# Patient Record
Sex: Female | Born: 1950
Health system: Southern US, Community
[De-identification: ages and names within clinical notes are randomized; demographics above are authoritative.]

## PROBLEM LIST (undated history)

## (undated) DIAGNOSIS — R7303 Prediabetes: Secondary | ICD-10-CM

## (undated) DIAGNOSIS — I499 Cardiac arrhythmia, unspecified: Secondary | ICD-10-CM

## (undated) DIAGNOSIS — G43909 Migraine, unspecified, not intractable, without status migrainosus: Secondary | ICD-10-CM

## (undated) DIAGNOSIS — M545 Low back pain, unspecified: Secondary | ICD-10-CM

## (undated) DIAGNOSIS — J349 Unspecified disorder of nose and nasal sinuses: Secondary | ICD-10-CM

## (undated) DIAGNOSIS — M542 Cervicalgia: Secondary | ICD-10-CM

## (undated) DIAGNOSIS — M797 Fibromyalgia: Secondary | ICD-10-CM

## (undated) DIAGNOSIS — IMO0001 Reserved for inherently not codable concepts without codable children: Secondary | ICD-10-CM

## (undated) DIAGNOSIS — K219 Gastro-esophageal reflux disease without esophagitis: Secondary | ICD-10-CM

## (undated) DIAGNOSIS — M1990 Unspecified osteoarthritis, unspecified site: Secondary | ICD-10-CM

## (undated) DIAGNOSIS — I639 Cerebral infarction, unspecified: Secondary | ICD-10-CM

## (undated) DIAGNOSIS — I10 Essential (primary) hypertension: Secondary | ICD-10-CM

## (undated) DIAGNOSIS — G8929 Other chronic pain: Secondary | ICD-10-CM

## (undated) DIAGNOSIS — F54 Psychological and behavioral factors associated with disorders or diseases classified elsewhere: Secondary | ICD-10-CM

## (undated) HISTORY — PX: CHOLECYSTECTOMY: SHX55

## (undated) HISTORY — DX: Fibromyalgia: M79.7

## (undated) HISTORY — PX: TONSILLECTOMY: SUR1361

## (undated) HISTORY — DX: Unspecified osteoarthritis, unspecified site: M19.90

## (undated) HISTORY — PX: ABDOMINAL HYSTERECTOMY: SHX81

## (undated) HISTORY — PX: BREAST CYST EXCISION: SHX579

## (undated) HISTORY — DX: Gastro-esophageal reflux disease without esophagitis: K21.9

## (undated) HISTORY — DX: Reserved for inherently not codable concepts without codable children: IMO0001

## (undated) HISTORY — DX: Other chronic pain: G89.29

## (undated) HISTORY — DX: Cervicalgia: M54.2

---
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
## 1998-02-13 ENCOUNTER — Inpatient Hospital Stay (HOSPITAL_COMMUNITY): Admission: EM | Admit: 1998-02-13 | Discharge: 1998-02-14 | Payer: Self-pay | Admitting: Emergency Medicine

## 1998-02-19 ENCOUNTER — Encounter: Admission: RE | Admit: 1998-02-19 | Discharge: 1998-02-19 | Payer: Self-pay | Admitting: Internal Medicine

## 1998-03-19 ENCOUNTER — Encounter: Admission: RE | Admit: 1998-03-19 | Discharge: 1998-03-19 | Payer: Self-pay | Admitting: Hematology and Oncology

## 1998-10-01 ENCOUNTER — Inpatient Hospital Stay (HOSPITAL_COMMUNITY): Admission: EM | Admit: 1998-10-01 | Discharge: 1998-10-03 | Payer: Self-pay | Admitting: Cardiology

## 1999-07-29 ENCOUNTER — Ambulatory Visit (HOSPITAL_COMMUNITY): Admission: RE | Admit: 1999-07-29 | Discharge: 1999-07-29 | Payer: Self-pay | Admitting: Gastroenterology

## 1999-12-02 ENCOUNTER — Ambulatory Visit (HOSPITAL_COMMUNITY): Admission: RE | Admit: 1999-12-02 | Discharge: 1999-12-02 | Payer: Self-pay | Admitting: Gastroenterology

## 2001-01-27 ENCOUNTER — Encounter: Admission: RE | Admit: 2001-01-27 | Discharge: 2001-02-02 | Payer: Self-pay | Admitting: Podiatry

## 2001-01-27 ENCOUNTER — Other Ambulatory Visit: Admission: RE | Admit: 2001-01-27 | Discharge: 2001-01-27 | Payer: Self-pay | Admitting: *Deleted

## 2001-01-27 ENCOUNTER — Encounter: Payer: Self-pay | Admitting: *Deleted

## 2001-01-27 ENCOUNTER — Ambulatory Visit (HOSPITAL_COMMUNITY): Admission: RE | Admit: 2001-01-27 | Discharge: 2001-01-27 | Payer: Self-pay | Admitting: *Deleted

## 2001-03-31 ENCOUNTER — Other Ambulatory Visit: Admission: RE | Admit: 2001-03-31 | Discharge: 2001-03-31 | Payer: Self-pay | Admitting: *Deleted

## 2001-05-23 ENCOUNTER — Emergency Department (HOSPITAL_COMMUNITY): Admission: EM | Admit: 2001-05-23 | Discharge: 2001-05-23 | Payer: Self-pay | Admitting: Emergency Medicine

## 2001-05-23 ENCOUNTER — Encounter: Payer: Self-pay | Admitting: Emergency Medicine

## 2001-09-27 ENCOUNTER — Encounter: Payer: Self-pay | Admitting: Emergency Medicine

## 2001-09-27 ENCOUNTER — Inpatient Hospital Stay (HOSPITAL_COMMUNITY): Admission: EM | Admit: 2001-09-27 | Discharge: 2001-09-29 | Payer: Self-pay | Admitting: Emergency Medicine

## 2001-09-29 ENCOUNTER — Encounter: Payer: Self-pay | Admitting: Internal Medicine

## 2001-10-17 ENCOUNTER — Encounter: Payer: Self-pay | Admitting: Family Medicine

## 2001-10-17 ENCOUNTER — Ambulatory Visit (HOSPITAL_COMMUNITY): Admission: RE | Admit: 2001-10-17 | Discharge: 2001-10-17 | Payer: Self-pay | Admitting: Family Medicine

## 2001-10-26 ENCOUNTER — Other Ambulatory Visit: Admission: RE | Admit: 2001-10-26 | Discharge: 2001-10-26 | Payer: Self-pay | Admitting: Obstetrics and Gynecology

## 2001-11-16 ENCOUNTER — Encounter: Payer: Self-pay | Admitting: Obstetrics and Gynecology

## 2001-11-16 ENCOUNTER — Ambulatory Visit (HOSPITAL_COMMUNITY): Admission: RE | Admit: 2001-11-16 | Discharge: 2001-11-16 | Payer: Self-pay | Admitting: Obstetrics and Gynecology

## 2002-01-14 ENCOUNTER — Encounter: Payer: Self-pay | Admitting: Cardiology

## 2002-01-14 ENCOUNTER — Encounter: Payer: Self-pay | Admitting: Emergency Medicine

## 2002-01-14 ENCOUNTER — Inpatient Hospital Stay (HOSPITAL_COMMUNITY): Admission: EM | Admit: 2002-01-14 | Discharge: 2002-01-16 | Payer: Self-pay | Admitting: Cardiology

## 2002-02-02 ENCOUNTER — Ambulatory Visit (HOSPITAL_COMMUNITY): Admission: RE | Admit: 2002-02-02 | Discharge: 2002-02-02 | Payer: Self-pay | Admitting: General Surgery

## 2003-04-28 ENCOUNTER — Encounter: Payer: Self-pay | Admitting: Emergency Medicine

## 2003-04-28 ENCOUNTER — Emergency Department (HOSPITAL_COMMUNITY): Admission: EM | Admit: 2003-04-28 | Discharge: 2003-04-28 | Payer: Self-pay | Admitting: Emergency Medicine

## 2003-10-22 ENCOUNTER — Ambulatory Visit (HOSPITAL_COMMUNITY): Admission: RE | Admit: 2003-10-22 | Discharge: 2003-10-22 | Payer: Self-pay | Admitting: Obstetrics and Gynecology

## 2004-07-08 ENCOUNTER — Encounter: Admission: RE | Admit: 2004-07-08 | Discharge: 2004-09-02 | Payer: Self-pay | Admitting: Orthopedic Surgery

## 2004-10-08 ENCOUNTER — Ambulatory Visit (HOSPITAL_COMMUNITY): Admission: RE | Admit: 2004-10-08 | Discharge: 2004-10-08 | Payer: Self-pay | Admitting: Family Medicine

## 2004-12-04 ENCOUNTER — Ambulatory Visit (HOSPITAL_COMMUNITY): Admission: RE | Admit: 2004-12-04 | Discharge: 2004-12-04 | Payer: Self-pay | Admitting: Family Medicine

## 2004-12-18 ENCOUNTER — Ambulatory Visit (HOSPITAL_COMMUNITY): Admission: RE | Admit: 2004-12-18 | Discharge: 2004-12-18 | Payer: Self-pay | Admitting: Family Medicine

## 2005-05-28 ENCOUNTER — Ambulatory Visit (HOSPITAL_COMMUNITY): Admission: RE | Admit: 2005-05-28 | Discharge: 2005-05-28 | Payer: Self-pay | Admitting: Family Medicine

## 2005-10-15 ENCOUNTER — Encounter: Payer: Self-pay | Admitting: Obstetrics and Gynecology

## 2005-10-15 ENCOUNTER — Observation Stay (HOSPITAL_COMMUNITY): Admission: RE | Admit: 2005-10-15 | Discharge: 2005-10-16 | Payer: Self-pay | Admitting: Obstetrics and Gynecology

## 2007-11-21 ENCOUNTER — Ambulatory Visit (HOSPITAL_COMMUNITY): Admission: RE | Admit: 2007-11-21 | Discharge: 2007-11-21 | Payer: Self-pay | Admitting: Family Medicine

## 2008-02-15 ENCOUNTER — Emergency Department (HOSPITAL_COMMUNITY): Admission: EM | Admit: 2008-02-15 | Discharge: 2008-02-15 | Payer: Self-pay | Admitting: Emergency Medicine

## 2008-03-16 ENCOUNTER — Ambulatory Visit (HOSPITAL_COMMUNITY): Admission: RE | Admit: 2008-03-16 | Discharge: 2008-03-16 | Payer: Self-pay | Admitting: Family Medicine

## 2009-01-18 ENCOUNTER — Ambulatory Visit (HOSPITAL_COMMUNITY): Admission: RE | Admit: 2009-01-18 | Discharge: 2009-01-18 | Payer: Self-pay | Admitting: Family Medicine

## 2009-06-01 ENCOUNTER — Inpatient Hospital Stay (HOSPITAL_COMMUNITY): Admission: EM | Admit: 2009-06-01 | Discharge: 2009-06-05 | Payer: Self-pay | Admitting: Emergency Medicine

## 2009-06-02 ENCOUNTER — Ambulatory Visit: Payer: Self-pay | Admitting: Gastroenterology

## 2009-06-03 ENCOUNTER — Ambulatory Visit: Payer: Self-pay | Admitting: Gastroenterology

## 2009-06-04 ENCOUNTER — Ambulatory Visit: Payer: Self-pay | Admitting: Gastroenterology

## 2009-06-07 ENCOUNTER — Encounter: Payer: Self-pay | Admitting: Gastroenterology

## 2009-09-20 ENCOUNTER — Ambulatory Visit (HOSPITAL_COMMUNITY): Admission: RE | Admit: 2009-09-20 | Discharge: 2009-09-20 | Payer: Self-pay | Admitting: Family Medicine

## 2010-05-28 ENCOUNTER — Observation Stay (HOSPITAL_COMMUNITY): Admission: EM | Admit: 2010-05-28 | Discharge: 2010-05-29 | Payer: Self-pay | Admitting: Emergency Medicine

## 2010-07-15 ENCOUNTER — Emergency Department (HOSPITAL_COMMUNITY): Admission: EM | Admit: 2010-07-15 | Discharge: 2010-07-15 | Payer: Self-pay | Admitting: Emergency Medicine

## 2010-07-31 ENCOUNTER — Emergency Department (HOSPITAL_COMMUNITY)
Admission: EM | Admit: 2010-07-31 | Discharge: 2010-07-31 | Payer: Self-pay | Source: Home / Self Care | Admitting: Emergency Medicine

## 2010-10-21 ENCOUNTER — Ambulatory Visit (HOSPITAL_COMMUNITY)
Admission: RE | Admit: 2010-10-21 | Discharge: 2010-10-21 | Payer: Self-pay | Source: Home / Self Care | Attending: Family Medicine | Admitting: Family Medicine

## 2010-10-27 ENCOUNTER — Encounter: Payer: Self-pay | Admitting: Family Medicine

## 2010-11-06 ENCOUNTER — Encounter: Payer: Self-pay | Admitting: *Deleted

## 2010-12-18 LAB — CBC
HCT: 32.8 % — ABNORMAL LOW (ref 36.0–46.0)
Hemoglobin: 11.4 g/dL — ABNORMAL LOW (ref 12.0–15.0)
MCH: 31.1 pg (ref 26.0–34.0)
MCHC: 34.6 g/dL (ref 30.0–36.0)
MCV: 90.1 fL (ref 78.0–100.0)
Platelets: 184 10*3/uL (ref 150–400)
Platelets: 206 10*3/uL (ref 150–400)
RBC: 3.58 MIL/uL — ABNORMAL LOW (ref 3.87–5.11)
RBC: 3.64 MIL/uL — ABNORMAL LOW (ref 3.87–5.11)
RDW: 12.3 % (ref 11.5–15.5)
WBC: 6.8 10*3/uL (ref 4.0–10.5)
WBC: 9.3 10*3/uL (ref 4.0–10.5)

## 2010-12-18 LAB — COMPREHENSIVE METABOLIC PANEL
ALT: 10 U/L (ref 0–35)
AST: 16 U/L (ref 0–37)
Albumin: 3.4 g/dL — ABNORMAL LOW (ref 3.5–5.2)
Alkaline Phosphatase: 54 U/L (ref 39–117)
Chloride: 109 mEq/L (ref 96–112)
Creatinine, Ser: 0.69 mg/dL (ref 0.4–1.2)
GFR calc Af Amer: >60 mL/min (ref >60)
Potassium: 3.6 mEq/L (ref 3.5–5.1)
Sodium: 139 mEq/L (ref 135–145)
Total Bilirubin: 0.4 mg/dL (ref 0.3–1.2)

## 2010-12-18 LAB — D-DIMER, QUANTITATIVE: D-Dimer, Quant: 0.24 ug/mL-FEU (ref 0.00–0.48)

## 2010-12-18 LAB — BASIC METABOLIC PANEL
Calcium: 8.9 mg/dL (ref 8.4–10.5)
Creatinine, Ser: 0.74 mg/dL (ref 0.4–1.2)
GFR calc Af Amer: >60 mL/min (ref >60)
GFR calc non Af Amer: >60 mL/min (ref >60)

## 2010-12-18 LAB — DIFFERENTIAL
Basophils Absolute: 0 10*3/uL (ref 0.0–0.1)
Basophils Absolute: 0 10*3/uL (ref 0.0–0.1)
Basophils Relative: 0 % (ref 0–1)
Eosinophils Absolute: 0.1 10*3/uL (ref 0.0–0.7)
Eosinophils Absolute: 0.1 10*3/uL (ref 0.0–0.7)
Eosinophils Relative: 1 % (ref 0–5)
Lymphocytes Relative: 31 % (ref 12–46)
Monocytes Absolute: 0.4 10*3/uL (ref 0.1–1.0)
Monocytes Absolute: 0.5 10*3/uL (ref 0.1–1.0)
Monocytes Relative: 6 % (ref 3–12)

## 2010-12-18 LAB — CARDIAC PANEL(CRET KIN+CKTOT+MB+TROPI)
CK, MB: 1.1 ng/mL (ref 0.3–4.0)
CK, MB: 1.1 ng/mL (ref 0.3–4.0)
Relative Index: INVALID (ref 0.0–2.5)
Total CK: 63 U/L (ref 7–177)
Troponin I: <0.01 ng/mL (ref 0.00–0.06)

## 2010-12-18 LAB — LIPID PANEL
Triglycerides: 146 mg/dL (ref <150)
VLDL: 29 mg/dL (ref 0–40)

## 2011-01-10 LAB — CBC
HCT: 26.9 % — ABNORMAL LOW (ref 36.0–46.0)
HCT: 27 % — ABNORMAL LOW (ref 36.0–46.0)
Hemoglobin: 10.1 g/dL — ABNORMAL LOW (ref 12.0–15.0)
Hemoglobin: 11.8 g/dL — ABNORMAL LOW (ref 12.0–15.0)
Hemoglobin: 9.6 g/dL — ABNORMAL LOW (ref 12.0–15.0)
MCHC: 34.9 g/dL (ref 30.0–36.0)
MCHC: 35 g/dL (ref 30.0–36.0)
MCHC: 35 g/dL (ref 30.0–36.0)
MCHC: 35.6 g/dL (ref 30.0–36.0)
MCV: 89.9 fL (ref 78.0–100.0)
MCV: 90.2 fL (ref 78.0–100.0)
MCV: 92.6 fL (ref 78.0–100.0)
Platelets: 138 10*3/uL — ABNORMAL LOW (ref 150–400)
Platelets: 167 10*3/uL (ref 150–400)
Platelets: 195 10*3/uL (ref 150–400)
RBC: 3.02 MIL/uL — ABNORMAL LOW (ref 3.87–5.11)
RBC: 3.19 MIL/uL — ABNORMAL LOW (ref 3.87–5.11)
RBC: 3.59 MIL/uL — ABNORMAL LOW (ref 3.87–5.11)
RDW: 12.4 % (ref 11.5–15.5)
RDW: 13.9 % (ref 11.5–15.5)
WBC: 6.1 10*3/uL (ref 4.0–10.5)

## 2011-01-10 LAB — DIFFERENTIAL
Basophils Absolute: 0 10*3/uL (ref 0.0–0.1)
Basophils Absolute: 0 10*3/uL (ref 0.0–0.1)
Basophils Relative: 0 % (ref 0–1)
Basophils Relative: 0 % (ref 0–1)
Eosinophils Absolute: 0.2 10*3/uL (ref 0.0–0.7)
Eosinophils Absolute: 0.2 10*3/uL (ref 0.0–0.7)
Eosinophils Relative: 2 % (ref 0–5)
Eosinophils Relative: 4 % (ref 0–5)
Lymphocytes Relative: 30 % (ref 12–46)
Lymphocytes Relative: 37 % (ref 12–46)
Lymphocytes Relative: 43 % (ref 12–46)
Lymphs Abs: 2.4 10*3/uL (ref 0.7–4.0)
Lymphs Abs: 2.7 10*3/uL (ref 0.7–4.0)
Lymphs Abs: 3.5 10*3/uL (ref 0.7–4.0)
Monocytes Absolute: 0.6 10*3/uL (ref 0.1–1.0)
Monocytes Relative: 6 % (ref 3–12)
Monocytes Relative: 8 % (ref 3–12)
Neutro Abs: 3.4 10*3/uL (ref 1.7–7.7)
Neutro Abs: 4.6 10*3/uL (ref 1.7–7.7)
Neutrophils Relative %: 51 % (ref 43–77)
Neutrophils Relative %: 55 % (ref 43–77)
Neutrophils Relative %: 63 % (ref 43–77)

## 2011-01-10 LAB — URINALYSIS, ROUTINE W REFLEX MICROSCOPIC
Leukocytes, UA: NEGATIVE
Protein, ur: NEGATIVE mg/dL
Urobilinogen, UA: 0.2 mg/dL (ref 0.0–1.0)

## 2011-01-10 LAB — COMPREHENSIVE METABOLIC PANEL
CO2: 28 mEq/L (ref 19–32)
Calcium: 9.4 mg/dL (ref 8.4–10.5)
Creatinine, Ser: 0.69 mg/dL (ref 0.4–1.2)
GFR calc Af Amer: >60 mL/min (ref >60)
GFR calc non Af Amer: >60 mL/min (ref >60)
Glucose, Bld: 107 mg/dL — ABNORMAL HIGH (ref 70–99)

## 2011-01-10 LAB — HEMOGLOBIN AND HEMATOCRIT, BLOOD
HCT: 21.3 % — ABNORMAL LOW (ref 36.0–46.0)
HCT: 24.6 % — ABNORMAL LOW (ref 36.0–46.0)
HCT: 29.8 % — ABNORMAL LOW (ref 36.0–46.0)
Hemoglobin: 10.4 g/dL — ABNORMAL LOW (ref 12.0–15.0)
Hemoglobin: 7.8 g/dL — CL (ref 12.0–15.0)
Hemoglobin: 8.7 g/dL — ABNORMAL LOW (ref 12.0–15.0)

## 2011-01-10 LAB — CROSSMATCH

## 2011-01-10 LAB — BASIC METABOLIC PANEL
BUN: 10 mg/dL (ref 6–23)
BUN: 4 mg/dL — ABNORMAL LOW (ref 6–23)
BUN: 5 mg/dL — ABNORMAL LOW (ref 6–23)
CO2: 27 mEq/L (ref 19–32)
CO2: 27 mEq/L (ref 19–32)
CO2: 30 mEq/L (ref 19–32)
Calcium: 7.9 mg/dL — ABNORMAL LOW (ref 8.4–10.5)
Calcium: 8.2 mg/dL — ABNORMAL LOW (ref 8.4–10.5)
Chloride: 108 mEq/L (ref 96–112)
Chloride: 109 mEq/L (ref 96–112)
Creatinine, Ser: 0.46 mg/dL (ref 0.4–1.2)
Creatinine, Ser: 0.66 mg/dL (ref 0.4–1.2)
GFR calc Af Amer: >60 mL/min (ref >60)
GFR calc non Af Amer: >60 mL/min (ref >60)
GFR calc non Af Amer: >60 mL/min (ref >60)
Glucose, Bld: 101 mg/dL — ABNORMAL HIGH (ref 70–99)
Glucose, Bld: 111 mg/dL — ABNORMAL HIGH (ref 70–99)
Glucose, Bld: 119 mg/dL — ABNORMAL HIGH (ref 70–99)
Potassium: 3.6 mEq/L (ref 3.5–5.1)
Potassium: 3.8 mEq/L (ref 3.5–5.1)
Sodium: 137 mEq/L (ref 135–145)
Sodium: 141 mEq/L (ref 135–145)

## 2011-01-10 LAB — URINE MICROSCOPIC-ADD ON

## 2011-01-10 LAB — URINE CULTURE
Colony Count: NO GROWTH
Culture: NO GROWTH

## 2011-01-10 LAB — PROTIME-INR
INR: 1.1 (ref 0.00–1.49)
Prothrombin Time: 12 seconds (ref 11.6–15.2)

## 2011-01-10 LAB — LIPASE, BLOOD: Lipase: 20 U/L (ref 11–59)

## 2011-01-10 LAB — MAGNESIUM: Magnesium: 1.8 mg/dL (ref 1.5–2.5)

## 2011-01-10 LAB — IRON AND TIBC: Saturation Ratios: 20 % (ref 20–55)

## 2011-02-17 NOTE — Op Note (Signed)
NAME:  Sarah [name], WITTERS NO.:  [id number]   MEDICAL RECORD NO.:  [id number]          PATIENT TYPE:  INP   LOCATION:  A203                          FACILITY:  [facility]   PHYSICIAN:  Kassie Mends, M.D.      DATE OF BIRTH:  12-16-1950   DATE OF PROCEDURE:  06/03/2009  DATE OF DISCHARGE:                               OPERATIVE REPORT   PRIMARY CARE PHYSICIAN:  Corrie Mckusick, M.D.   PROCEDURE:  Ileocolonoscopy.   INDICATION FOR EXAM:  Ms. Khurana is a 60 year old female who presented  with mild right lower quadrant discomfort and rectal bleeding.   FINDINGS:  1. Pancolonic diverticulosis most pronounced in the sigmoid colon.  No      active bleeding noted.  No large clots seen.  Otherwise no masses,      inflammatory changes or polyps.  Polyps less than 5 mm would have      been missed in the right colon.  There was a significant amount of      liquid stool left in the right colon.  2. Normal terminal ileum, approximately 15 cm visualized.  No old      blood or fresh blood seen in the terminal ileum.  3. Normal retroflexed view of the rectum.   DIAGNOSIS:  Hematochezia secondary to diverticular bleed.   RECOMMENDATIONS:  1. Advance diet.  2. Protonix b.i.d.  3. Hemoglobin and hematocrit every 12 hours.  Next one at 1600.      Transfuse as needed.  4. Consider a surgery consult if her number of blood transfusions      reaches greater than 10 units of packed red blood cells.  She      currently has received 2 units since admission.  5. Colonoscopy in 5 years.   MEDICATIONS:  1. Demerol 50 mg IV.  2. Versed 3 mg IV.   PROCEDURE TECHNIQUE:  Physical exam was performed.  Informed consent was  obtained from the patient after explaining benefits, risks and  alternatives to the procedure.  The patient was connected to the monitor  placed and placed in the left lateral position.  Continuous oxygen was  provided by nasal cannula and IV medicine administered through an  indwelling cannula.  After administration of sedation and rectal exam,  the patient's rectum was intubated  and the scope was advanced under direct visualization to the distal  terminal ileum.  The scope was removed slowly by carefully examining the  color, texture, anatomy and integrity of the mucosa on the way out.  The  patient was recovered in endoscopy and discharged to the floor in  satisfactory condition.      Kassie Mends, M.D.  Electronically Signed     SM/MEDQ  D:  06/03/2009  T:  06/03/2009  Job:  [id number]   cc:   Corrie Mckusick, M.D.  Fax: [phone]

## 2011-02-17 NOTE — H&P (Signed)
NAME:  Sarah [name], SALLIE NO.:  [id number]   MEDICAL RECORD NO.:  [id number]          PATIENT TYPE:  INP   LOCATION:  A203                          FACILITY:  [facility]   PHYSICIAN:  Dorris Singh, DO    DATE OF BIRTH:  1951/02/09   DATE OF ADMISSION:  06/01/2009  DATE OF DISCHARGE:  LH                              HISTORY & PHYSICAL   The patient is a 60 year old Caucasian female who presented to the  emergency room complaining of lower GI bleed.  The patient states that  over the last couple days she has felt pretty nauseated and has had some  right lower abdominal pain that has progressed to bilateral lower  abdominal pain.  She states that she has had some weakness and some  dizziness and some fever and chills.  She denies eating anything unusual  over the last couple of weeks.  She said that today she went to bathroom  and had a frank blood in the toilet.  She came to the emergency room and  also had several episodes of frank blood measuring up to 100 mL in a  nun's hat.  She also mentioned she has had diarrhea.  Currently she does  not have any other diarrhea.  Nothing is making it better and the  symptoms have been associated with abdominal pain.   PAST MEDICAL HISTORY:  She has had hysterectomy and appendectomy.  She  has acid reflux, multiple sclerosis, as well as postmenopausal.   SOCIAL HISTORY:  She is married.  She is a nonsmoker, nondrinker, and  denies any drug use.   ALLERGIES:  She has to PENICILLIN.   The patient also states that she does take Premarin and Nexium.  She has  not been able to afford the medication so therefore she has not been on  them.   REVIEW OF SYSTEMS:  Constitutionally positive for fever, decreased  appetite, chills, dizziness, fatigue.  Gastrointestinal:  Positive for  diarrhea, abdominal pain, blood in stool and nausea.  Neuro:  Positive  for weakness and dizziness.  All systems negative except as marked or  noted in the  HPI.   CURRENT PHYSICAL EXAM:  VITAL SIGNS:  Her vital signs have been stable  with blood pressure 119/92, pulse rate 79, respirations 18, temperature  97.9, and she is pulse oxing on room air at 100%.  GENERAL:  The patient is a well-developed, well-nourished Caucasian  female who is currently in no acute distress.  HEAD AND FACE:  Atraumatic, normocephalic.  Her eyes are PERRL.  EOMI.  No conjunctival injection or scleral icterus.  Her ears __________  within normal limits.  Teeth are in fair repair.  NECK:  Supple.  Full range of motion.  No thyromegaly, carotid bruit, or  lymphadenopathy.  HEART:  Regular rate and rhythm.  No murmur, rub or gallops noted.  LUNGS:  Her respirations are clear to auscultation bilaterally.  No  rales, wheezes, or rhonchi.  ABDOMEN:  Soft with tenderness in the left and right lower quadrant,  nondistended.  No masses, no hepatosplenomegaly noted.  Bowel  sounds are  hyperactive.  GU:  External genitalia is within normal limits.  There is no CVA  tenderness.  EXTREMITIES:  Positive pulses.  No edema, ecchymosis or cyanosis.  Full  range of motion.  NEURO:  A plus O x3.  Motor is intact in all extremities.  Sensation is  normal and cranial nerves II-XII are grossly intact.  SKIN:  Cool, dry.  No petechia or rash noted.  PSYCHIATRIC:  The patient to the A plus O x4, mildly anxious.   CURRENT LABS:  Her lipase was 20 and her B-Met:  Sodium was 140,  potassium 3.3, chloride 109, carbon dioxide 20, glucose 107, BUN 12,  creatinine 0.69.  Her liver enzymes were within normal limits.  Her INR  is 0.9.  She does have a few bacteria in her urine.  However, she is  nitrate negative and leukocyte esterase negative.  Her white count is  9.5.  Her hemoglobin of 11.8, hematocrit 33.2.   ASSESSMENT:  Lower GI bleed.   PLAN:  Will admit the patient to the service of [facility].  Will make her n.p.o.  Will start her on serial H and H's,  iron  studies, cross and type her for 2 units of blood.  Also give her  antiemetics.  Consult GI to see her.  Will also order a CT of the  abdomen and pelvis due to this being an infective process.  She is also  has __________ samples taken.  We will continue to monitor her and  monitor strict I's and O's for tonight.  The patient currently is stable  but will monitor her vitals and blood work to determine if she needs to  be seen more emergently by a specialist.  Will also put her on DVT and  GI prophylaxis.  Will continue to monitor her and change therapy as  necessary.      Dorris Singh, DO  Electronically Signed     CB/MEDQ  D:  06/01/2009  T:  06/01/2009  Job:  [id number]

## 2011-02-17 NOTE — Discharge Summary (Signed)
NAME:  Sarah [name], Sarah [name] NO.:  [id number]   MEDICAL RECORD NO.:  [id number]          PATIENT TYPE:  INP   LOCATION:  A203                          FACILITY:  [facility]   PHYSICIAN:  Melissa L. Ladona Ridgel, MD  DATE OF BIRTH:  [birth year]   DATE OF ADMISSION:  06/01/2009  DATE OF DISCHARGE:  09/01/2010LH                               DISCHARGE SUMMARY   DISCHARGE DIAGNOSES:  1. Diverticular bleed.  2. Chronic back pain.  3. History of multiple sclerosis.  4. Gastroesophageal reflux disease.   CONSULTS:  She was seen by gastroenterology on August 29 for GI bleeding  by Dr. Cira Servant.   PROCEDURES:  On August 30th, the patient had a colonoscopy.  This was  also done by Dr. Cira Servant and results of the colonoscopy are as follows.  1. Pan colonic diverticulosis most pronounced in the sigmoid.  2. Normal terminal ileum, no fresh or old blood seen.  Dr. Cira Servant does      mention that there was a significant amount of liquid stool left in      the right colon and the polyps, less than 5 mm, would have been      missed in the right colon.   PROCEDURE #2:  On August 30, the patient had a blood transfusion.  She  received 2 units of A positive without complications.   HISTORY AND BRIEF HOSPITAL COURSE:  Sarah [name] is a 60 year old  Caucasian female who lost her insurance and consequently has not been  following up with her doctors as she normally would.  She presented to  the [facility] Emergency Department complaining of lower abdominal pain,  nausea, diarrhea and weakness as well as fever x2 days.  She had frank  blood per rectum on June 01, 2009.  She was admitted to [facility], given empiric Rocephin, started on IV fluids and a  gastroenterology consult was called.  A CT scan of her abdomen, pelvis  was done and it was found to be negative for appendicitis or any  particular source of pain.  It was positive for sigmoid diverticulosis.  Symptomatically, the patient continued  having dark liquid bowel  movements.  Colonoscopy was done.  Results were noted above.  No fresh  blood was seen.  On the day after the colonoscopy on August 31, the  patient did have 1 dark watery stool.  Her diet was advanced.  Unfortunately the patient continued to complain of some mild right lower  abdominal and back pain; however, she was able to tolerate a regular  diet, ambulate and she is having bowel movements now without blood as  she describes them to me this morning.   PHYSICAL EXAM:  VITAL SIGNS:  Today her vital signs are as follows.  Temperature 97.1, pulse 59, respirations 18, blood pressure is 103/66.  GENERAL:  The patient is a thin, 60 year old Caucasian female in no  apparent distress.  HEENT:  Her head is atraumatic normocephalic.  Her eyes are anicteric  with pupils that are equal, round, reactive to light.  Her conjunctivae  are pink.  Nose shows no nasal discharge or exterior lesions.  Her mouth  has moist mucous membranes with no exterior lesions.  NECK:  Her neck has no lymphadenopathy.  No JVD.  Trachea is midline.  Neck is supple.  CHEST:  Chest has no accessory muscle usage.  She has no wheezes or  crackles to my auscultation.  HEART:  Her heart has regular rate and rhythm without murmurs, rubs or  gallops.  ABDOMEN: Soft, tender to palpation just over the bladder as well as in  her right lower quadrant, not tender elsewhere.  She has a well-healed  scar from her cesarean section, good bowel sounds.  EXTREMITIES:  Extremities show no edema, no cyanosis.  She has 2+  bilateral pulses in both her radial pulse and her dorsalis pedis pulses.  PSYCHIATRIC:  The patient describes that she is going through a  separation and has increased anxiety; however, she has no suicidal  ideations, homicidal ideations or evidence of chronic depression.   SIGNIFICANT LABS:  The patient was admitted with a hemoglobin of 11.8.  This trended down to 7.8 on August 30.  She  received 2 units of packed  red blood cells and on the evening of August 31, her hemoglobin was  10.4.  This morning, September 1, her hemoglobin is 12.1, but I am  suspicious of the accuracy of that lab value.  Her B-met has remained  within normal limits other than glucose that ranged from 101-119.  Urinalysis on admission was negative for nitrates, negative from  leukocytes.  She did have 3-6 white blood cells per high-powered field  so a urine culture was done and it did not grow anything.   This morning when I talked to the patient about discharge, she described  her right lower quadrant pain and said that 2 years ago, she was  diagnosed by Dr. Emelda Fear with a pocket in the area of her right lower  quadrant.  He did a scan in his office and it was decided that they  should just follow it. I re-reviewed her CT scan from August 29 with Dr.  Amil Amen, the radiologist and no source of her pain was found.  There  were no abscesses, fluid pockets and her appendix did not look inflamed.  She did not have ovaries.  There was no obvious source of her pain.  The  patient does have an appointment with [facility] on  September 14.  Will ensure that she has follow-up for this right lower  quadrant pain at that time in the outpatient setting.   DISPOSITION:  The patient is being discharged to home in stable  condition.   FOLLOW UP:  She will have follow-up with Dr. Kassie Mends at [facility] in 2-3 weeks.  She will also be seen at the [facility]  of Health on September 14.  Her primary care physician was Dr. Phillips Odor:  We will ensure that he receives a copy of this transcription.   DISCHARGE MEDICATIONS:  1. Stool softener daily of the patient's choice  2. Ultram 50 mg 1-2 tablets q.4-6 h as needed for pain.  Do not take      more than 8 tablets in 24 hours.  We are giving her a prescription      for 60 tablets, which should be more than enough to last her until       she gets to [facility] on the [facility]th.   Approximately 45 minutes  was spent on this discharge summary.   Addendum: I saw and examined this patient prior to her discharge.  I  agree with the above exam.  The patient will  return to ER if she  develops further bleeding  otherwise she has been given instructions to  followup with the he Health Department as above.   Melissa L. Ladona Ridgel, M.D.      Stephani Police, PA      Melissa L. Ladona Ridgel, MD  Electronically Signed    MLY/MEDQ  D:  06/05/2009  T:  06/05/2009  Job:  [id number]   cc:   Health Department in [place] Rudd, M.D.  [address]  [place] , [place] [address]   Corrie Mckusick, M.D.  Fax: [phone]

## 2011-02-17 NOTE — Consult Note (Signed)
NAME:  Sarah [name], Sarah [name] NO.:  [id number]   MEDICAL RECORD NO.:  [id number]          PATIENT TYPE:  INP   LOCATION:  A203                          FACILITY:  [facility]   PHYSICIAN:  Kassie Mends, M.D.      DATE OF BIRTH:  [birth year]   DATE OF CONSULTATION:  06/02/2009  DATE OF DISCHARGE:                                 CONSULTATION   REASON CONSULTATION:  Rectal bleeding.   HISTORY OF PRESENT ILLNESS:  Sarah [name] is a 60 year old female who has  a known history of sigmoid colon diverticulosis.  She has never had a  colonoscopy.  She presented with 5 episodes of bright red blood per  rectum, approximately 1-2 cups at a the time, started last night.  She  has some mild right lower quadrant pain associated with it.  No fever,  chills, vomiting, diarrhea or problems swallowing.  She had some hot  flashes and some mild nausea associated with her symptoms.  This has  never happened before.  She uses Zantac for heartburn because she cannot  afford Nexium.  She denies any aspirin, BC, Goody powder use.  She uses  Aleve twice a day for headaches and back pain.  She does not smoke and  does not use any alcohol.  She has not consumed any eggs.  She did eat  out at [facility] on Friday in [place] and she had chicken.   PAST MEDICAL HISTORY:  1. Gastroesophageal reflux disease.  2. Possible multiple sclerosis manifested by numbness and weakness in      her legs.   PAST SURGICAL HISTORY:  1. Hysterectomy.  2. Appendectomy.   ALLERGIES:  PENICILLIN.   MEDICATIONS:  1. Cipro.  2. Lasix.  3. Protonix.  4. Dilaudid.  5. Zofran.   FAMILY HISTORY:  She denies any family history of colon cancer, colon  polyps or inflammatory bowel disease.   SOCIAL HISTORY:  She is married.  She has 1 child by her first marriage.  He is 29.   REVIEW OF SYSTEMS:  Per the HPI, otherwise all systems are negative.   PHYSICAL EXAMINATION:  VITAL SIGNS:  T-max 97.3,  blood pressure 119/57,  respiratory rate 18, heart rate 66.  GENERAL:  She is in no apparent distress, alert but sleepy.  She did  fall asleep in the middle of obtaining her history.  HEENT:  Atraumatic, normocephalic.  Pupils equal, react to light.  Mouth  no oral lesions.  Posterior pharynx without erythema or exudate.  NECK:  Full range of motion.  No lymphadenopathy.  LUNGS:  Clear to auscultation bilaterally.  CARDIOVASCULAR:  Shows regular rhythm, no murmur, normal S1, S2.  ABDOMEN:  Bowel sounds are present, soft, nondistended, mild tenderness  to palpation in the right lower quadrant without rebound or guarding.  EXTREMITIES:  No cyanosis or edema.  NEURO:  She has no focal neurologic deficits.   LABS:  White count 7.6, hemoglobin 11.8-9.1, platelets 195.  INR 1.1.  Creatinine 0.66, normal hepatic function, lipase 20.  UA 3-6 WBCs and  few bacteria.   ASSESSMENT/PLAN:  Ms.  Sarah [name] is a 60 year old female who has a lower  gastrointestinal bleed.  Most likely reason is a diverticular bleed.  Thank you for allowing me to see Sarah [name] in consultation.  My  recommendations follow.   RECOMMENDATIONS:  1. Clear liquid diet and n.p.o. after midnight.  2. MiraLax prep.  3. Colonoscopy on 08/30.  4. Check hemoglobin, hematocrit every 8 hours.  5. B.i.d. proton pump inhibitor.      Kassie Mends, M.D.  Electronically Signed     SM/MEDQ  D:  06/02/2009  T:  06/02/2009  Job:  [id number]   cc:   Corrie Mckusick, M.D.  Fax: [phone]

## 2011-02-20 NOTE — H&P (Signed)
[place]  Patient:    Sarah [name], Sarah [name] Visit Number: [id number] MRN: [id number]          Service Type: MED Location: 2000 2040 01 Attending Physician:  Jonelle Sidle Dictated by:   Jonelle Sidle, M.D. [facility] Admit Date:  01/14/2002 Discharge Date: 01/16/2002                           History and Physical  DATE OF BIRTH:  1951/09/19  PRIMARY CARE PHYSICIAN:  Jonell Cluck, M.D.  CHIEF COMPLAINT:  Shortness of breath and chest discomfort.  HISTORY OF PRESENT ILLNESS:  Ms. Dickison is a 60 year old woman with past medical history outlined below, who presents in transfer from [facility] Emergency Department.  Her presenting history begins on Thursday evening when she developed a sensation of "something stuck in my throat," associated with general malaise.  The following day she began to develop some ill-defined, "achy" chest discomfort and vague left arm numbness.  This persisted for several hours throughout the day without any particular aggravating or alleviating factors.  Later that evening, the patient developed further malaise and a dry cough and relative insomnia with subjective fevers and chills.  The following morning the patient was not feeling any different and presented to her primary care physician, Dr. Nobie Putnam.  On her way back to the examining room the patient apparently "blacked out" for a few seconds and fell to the floor.  She was subsequently transferred to the emergency department at [facility] for further evaluation.  The patient had a chest x-ray at that facility that showed no acute cardiopulmonary disease.  Her 12-lead electrocardiogram showed nonspecific T-wave changes and a relative decrease in her anterior R-wave progression compared to previous tracings in 1999.  She was subsequently transferred for further evaluation.  At present the patient complains predominantly of a pleuritic chest discomfort and  infrequent dry cough.  She describes lower extremity cramping, but has had no calf swelling or tenderness.  In general she was in her usual state of health until Thursday evening.  ALLERGIES:  PENICILLIN.  MEDICATIONS: 1. Prevacid 20 mg p.o. q.d. 2. Premarin 0.125 mg p.o. q.d.  PAST MEDICAL HISTORY: 1. Osteoarthritis. 2. Nephrolithiasis, diagnosed in December of 2002. 3. History of sinusitis. 4. Status post hysterectomy in 1986. 5. Status post cholecystectomy in the 1990s. 6. Status post herniorrhaphy in the 1990s. 7. No known history of type 2 diabetes mellitus, hypertension, coronary    artery disease, congestive heart failure, dyslipidemia, or arrhythmias.  SOCIAL HISTORY:  The patient lives in [place] with her husband of six years. 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  She works third shift.  She denies tobacco and alcohol use as well as illicit drug use.  FAMILY HISTORY:  The patients mother died at age 55 with a myocardial infarction.  History also significant for type 2 diabetes mellitus and hypertension.  REVIEW OF SYSTEMS:  As described in History of Present Illness.  The patient also complains of headaches and occasional arthralgias.  She has intermittent palpitations as well and these were part of her presenting symptoms on Friday.  PHYSICAL EXAMINATION:  VITAL SIGNS:  Temperature 96.7 degrees, heart rate 56 and regular, respirations 16, blood pressure 120/70, oxygen saturation 99% on 2 L nasal cannula.  GENERAL:  This is an obese woman lying supine in bed in no acute distress. She  seems uncomfortable.  HEENT:  Conjunctivae and lids are normal.  Oropharynx is clear.  NECK:  Supple without elevated jugular venous pressure or carotid bruits.  No thyromegaly is noted.  LUNGS:  Clear to auscultation bilaterally.  CARDIOVASCULAR:  Reveals a bradycardic regular rhythm without murmur or S3 gallop.  No pericardial rub is  noted.  ABDOMEN:  Soft and nontender without rebound or guarding.  No obvious hepatomegaly or bruits are noted.  EXTREMITIES:  The extremities exhibit no clubbing, cyanosis, or edema.  SKIN:  No ulcerative changes or rashes are noted.  MUSCULOSKELETAL:  No kyphosis noted.  NEUROPSYCHIATRIC:  The patient is alert and oriented x3.  Affect is normal.  LABORATORY DATA:  From [facility]:  WBC 5.9, hemoglobin 12.2, hematocrit 33.6, platelet count 249.  Blood gas shows a pH of 7.49, PCO2 27, PO2 71, bicarb 20, oxygen saturation 95% on room air.  D-dimer is less than 0.50.  Total CK 61, CK-MB 1.3, troponin I 0.04.  No chemistries were obtained.  Chest x-ray shows no acute cardiopulmonary disease pattern.  A 12 lead electrocardiogram shows normal sinus rhythm at 65 beats per minute with nonspecific ST/T-wave changes in the lateral leads and relatively decreased R-wave progression in the precordial leads compared to previous tracing from 1999.  There is also a leftward axis nearly diagnostic of a left anterior fascicular block.  IMPRESSION/RECOMMENDATIONS: 1. Constellation of symptoms including malaise, palpitations initially,    atypical, somewhat pleuritic chest discomfort, dyspnea, and a nonproductive    cough in a 60 year old woman with no known history of coronary artery    disease.  A 12-lead electrocardiogram is abnormal but nonspecific.  Initial    cardiac enzymes are normal.  D-dimer is borderline normal.  Suspect that    patients symptoms are noncardiac based on presentation, but would continue    serial cardiac enzymes and repeat a 12 lead electrocardiogram in the    morning.  Although somewhat atypical for a pulmonary embolus, the apparent    episode of syncope raises this possibility, particularly given dyspnea and    pleuritic chest discomfort, although the patient is not either tachycardic    or hypoxic.  Would favor a CT scan of the chest to exclude this     possibility.  We may be simply dealing with a viral upper respiratory    infection.  Will reevaluate when more information is available.     Chemistries are pending. 2. Apparent history of gastroesophageal reflux, on Prevacid. 3. Postmenopausal woman, on Premarin. Dictated by:   Jonelle Sidle, M.D. [facility] Attending Physician:  Jonelle Sidle DD:  01/14/02 TD:  01/14/02 Job: [id number]

## 2011-02-20 NOTE — H&P (Signed)
NAME:  Sarah [name], Sarah [name] NO.:  [id number]   MEDICAL RECORD NO.:  [id number]          PATIENT TYPE:  AMB   LOCATION:  DAY                           FACILITY:  [facility]   PHYSICIAN:  Tilda Burrow, M.D. DATE OF BIRTH:  1951/09/11   DATE OF ADMISSION:  DATE OF DISCHARGE:  LH                                HISTORY & PHYSICAL   ADMISSION DIAGNOSES:  1.  Pelvic pain, suspected ovarian adhesions bilaterally.  2.  Rectocele and possible enterocele (pelvic relaxation).   HISTORY OF PRESENT ILLNESS:  This 60 year old female has been followed  through our office since 2003 for intermittent symptoms of pelvic  discomfort.  The pain is sufficient that the patient describes it as  interfering with her life.  She complains of pain 24 hours a day in the  pelvis, constant, feels like something is pulling and stretching the  stomach.  She cannot have intercourse at all because of the painful  discomfort.  The pain is so severe that it interferes with work and home  life.  She also has difficulty with bowel movement.  The back of so weak as  to require fixing in such a way that bowel movements to come out.  She  requires splinting on a regular basis to get the bowel movements out.  She  has a high rectocele which is very pronounced and protrudes through the  introitus.  There is a possible accompanying enterocele.   PAST MEDICAL HISTORY:  1.  Hysterectomy in 1980s for pelvic prolapse.  2.  Unsuccessful Nissen fundoplication.  3.  Surgical history also include D&C in the 1980s, cholecystectomy in the      1990s.  4.  Abdominal hernia.  5.  Right inguinal hernia.  6.  Breast biopsy.   MEDICATIONS:  Premarin 1.25 mg daily.   SOCIAL HISTORY:  The patient has an alcoholic life partner who drinks daily  but does maintain a job.  The patient personally denies cigarettes, alcohol,  or recreational drugs.   PHYSICAL EXAMINATION:  VITAL SIGNS:  Height 5 feet 5 inches, weight 152.  Blood pressure 120/80.  HEENT:  Pupils equal, round, and reactive.  NECK:  Supple.  Trachea midline.  CHEST: Clear.  ABDOMEN:  Midline, lower abdominal, and transverse abdominal scars.  PELVIC:  External genitalia multiparous.  Uterus anteflexed.  Adnexa tender  on bimanual exam.  On straining down, the patient has a bulging high  rectocele which protrudes through the introitus and requires daily splinting  to defecate.   PLAN:  1.  Removal of tubes and ovaries.  2.  Correction of rectocele and possible correction of enterocele.   The patient is aware that, if the prior surgeries result in adhesions, it  may require laparotomy to remove ovaries instead of just laparoscopy.      Tilda Burrow, M.D.  Electronically Signed     JVF/MEDQ  D:  10/12/2005  T:  10/12/2005  Job:  [id number]

## 2011-02-20 NOTE — Op Note (Signed)
NAME:  Sarah [name], Sarah [name] NO.:  [id number]   MEDICAL RECORD NO.:  [id number]          PATIENT TYPE:  OBV   LOCATION:  A428                          FACILITY:  [facility]   PHYSICIAN:  Tilda Burrow, M.D. DATE OF BIRTH:  August 24, 1951   DATE OF PROCEDURE:  10/15/2005  DATE OF DISCHARGE:                                 OPERATIVE REPORT   PREOPERATIVE DIAGNOSIS:  Pelvic pain, rectocele.   POSTOPERATIVE DIAGNOSIS:  Pelvic pain, rectocele, enterocele.   PROCEDURE:  Laparoscopic bilateral salpingo-oophorectomy, posterior repair.   SURGEON:  Dr. Emelda Fear.   ASSISTANT:  None.   ANESTHESIA:  General.   COMPLICATIONS:  None.   FINDINGS:  Deep pelvis left ovary densely adherent to left pelvic side wall,  right ovary easily accessible with thin adhesions to side wall. Deep cul-de-  sac extending past vaginal apex enough to be considered an enterocele.  Limited posterior pelvic support, corrected by rectocele repair.   DETAILS OF PROCEDURE:  The patient was taken to the operating room, prepped  and draped for combined abdominal vaginal procedure. Single-tooth tenaculum  was used to grasped the cervix, and Foley catheter was in place. An  infraumbilical vertical 2-cm incision was made as well as a vertical 2-cm  suprapubic incision. Veress needle was used through the umbilicus.  Pneumoperitoneum easily achieved with water droplet test used and  intraperitoneal location confirmed. Pneumoperitoneum was achieved under 7 mm  intra-abdominal pressure, building up as 3 liters were infused. Laparoscopic  trocar was used under direct visualization to identify intraperitoneal  location. There was some oozing from around the trocar site, but there was  no evidence of trauma to the omentum, bowel or internal organs. The  __suprapubic Trochar was inserted__  under direct visualization, initially a  5-mm trocar. The patient was placed in Trendelenburg position, and the bowel  was which was  _moderately full despite bowel prep,was elevated to reveal  the_  left ovary to rather densely adherent on the left pelvic side wall and  the right ovary to be less severely adherent, also deep in the pelvis on the  patient's right. It was felt that these were placed under traction as they  had been attached down in the cuff. We then proceeded to place a third 5-mm  trocar in the right lower quadrant and replaced the suprapubic trocar with a  12-mm trocar. Attention was then directed to the left lower quadrant where a  harmonic scalpel was used to identify the infundibulopelvic ligament. The  ureter could be seen onto the right side deep and was placed on traction,  elevated from the side wall, and transected with harmonic scalpel. The  attachments from the remnants of the utero-ovarian ligament were transected  well, and hemostasis was quite concerned regarding the ureter.   On the left side, it was first necessary to free up some adhesions at the  level of the pelvic brim so that the bowel could be retracted out of the  way. The left side did not allow direct visualization of the ureter, but the  infundibulopelvic ligament was distinctly _delineated.  We could separate___  IP ligament by entering the retroperitioneum  between the tube and ovary and  the remnants of the left round ligament . We were careful to isolate the  infundibulopelvic ligament. We were not able to specifically identify the  ureter on the left side, but we were able to open the window on both sides  of the infundibulopelvic ligament such that we did not suspect in any way  that the ureter could be incorporated. During this process, the tube was  mobile enough that it could be taken off separately using the harmonic  scalpel. The _ovary was_  placed under traction and transected. The cephalad  portion of the ovary could be placed under traction, and we bluntly  dissected the lateral portion of the ovary off of the  underlying peritoneum.  We were very careful to ensure that we did not enter into the  retroperitoneum in such a way to jeopardize the ureter which we felt was  likely to be posterior and deep away from where we were operating.  Hemostasis was quite satisfactory. There had been some oozing but no  arterial bleeding noted. At this point in time, the pelvis was irrigated. An  EndoCatch bag placed through the suprapubic site and the specimens  extracted. Pelvis was irrigated and confirmed as hemostatic. Deflation of  the abdomen was followed by closure of the peritoneal cavity with 0 Vicryl  at the fascial layer and subcuticular 4-0 Dexon at the skin level with  staples placed.   Posterior repair. Posterior repair was performed by placing the patient's  legs in a high lithotomy position by adjusting the yellow-fin leg supports  and then splitting the posterior perineum at the level of the hymen  __________ which confirmed that we had been able to dissect the vaginal  mucosa off laterally. It was felt that there was __________  peritoneal  cavity. We were able to grasp what appeared to be perirectal supportive  tissues on each side. It appeared that the rectal defect was on the left  side of the midline. The Allis clamps were used to grasp the remnants of the  pelvic supportive tissue. Two sutures of 0 Dexon were placed while the right  index finger remained in the rectum. We then proceeded with a series of  horizontal mattress sutures of 2-0, sutures of 0 Dexon to pull the rectal  tissues together and situated it to rebuild the perineal body. The vaginal  mucosa was trimmed, and then reapproximated __________ . Inspection of the  posterior peritoneum revealed a rather relative firm shelf that separated  rectum and vagina. This was considered stable and did not overly restrict  the vaginal diameter. The patient then had vaginal packing inserted. Two- hundred fifty cc of urine output was  performed during the procedure.  Estimated blood loss 50 cc. Sponge and needle counts correct.      Tilda Burrow, M.D.  Electronically Signed     JVF/MEDQ  D:  10/15/2005  T:  10/16/2005  Job:  [id number]

## 2011-02-20 NOTE — Discharge Summary (Signed)
[facility]  Patient:    Sarah [name], Sarah [name] Visit Number: [id number] MRN: [id number]          Service Type: OUT Location: RAD Attending Physician:  Kirk Ruths Dictated by:   Arna Snipe, M.D. Admit Date:  10/17/2001 Discharge Date: 10/17/2001                             Discharge Summary  ADMITTING DIAGNOSIS:  Right lower abdominal pain, rule out acute appendicitis.  FINAL DIAGNOSIS:  Urosepsis.  COMPLICATIONS:  None.  PROGNOSIS:  Good, the patient recovered.  HISTORY OF PRESENT ILLNESS:  This 60 year old white female was admitted to this hospital on December 24 with a 24 hour history of right lower quadrant pain with associated nausea and vomiting.  Physical examination revealed a healthy 60 year old white female in acute distress.  There was marked tenderness in the right lower quadrant with rebound.  There was no referred rebound.  She had normal peristalsis.  HOSPITAL COURSE AND TREATMENT:  The patient had to be sent to [facility] for a CT of the abdomen because of failure of the unit working at this institution.  This examination was negative.  Chest x-ray was negative. Pelvic examination was negative.  She was placed on IVs and antibiotics and by December 25 she seemed much improved.  We did an ultrasound of the pelvis and also vaginal ultrasound which was performed.  This was within normal limits. There was some suggestion of swelling and dilatation of the right ureter suggestive of the patient passing a ureteral calculus.  She was discharged home on this date on Septra, much improved.  Her final diagnosis was urosepsis. Dictated by:   Arna Snipe, M.D. Attending Physician:  Kirk Ruths DD:  10/21/01 TD:  10/24/01 Job: [id number]

## 2011-02-20 NOTE — Discharge Summary (Signed)
[facility]  Patient:    Sarah [name], Sarah [name] Visit Number: [id number] MRN: [id number]          Service Type: EMS Location: ED Attending Physician:  Herbert Seta Dictated by:   Joellyn Rued, P.A.-C. Admit Date:  01/14/2002 Discharge Date: 01/14/2002   CC:         Colette Ribas, M.D., [place], [place]  [facility] [place], [place] office)   Discharge Summary  DATE OF BIRTH: 1951-08-17  ADMITTING PHYSICIAN: Jonelle Sidle, M.D.  HISTORY OF PRESENT ILLNESS: Sarah [name] is a 60 year old white female, who was transferred from [facility] emergency room secondary to syncope.  She stated that since the preceding Thursday she felt like she was developing sinus problems; i.e., stuffy nose, fatigue, aches, cough.  However, she has not had any production or drainage.  She worked third shift on Thursday night and felt worse toward the end of the shift, and she actually developed anterior left-sided chest pressure with occasional radiation to her left arm and to her back.  When it is at its worst it is 10 on a scale of 0-10.  She feels it is worse with deep breathing, increased coughing.  It is also noted she had some increased shortness of breath, nausea, and diaphoresis.  She did not obtain any relief with over-the-counter prescription for her sinuses.  On Friday she laid down all day but unable to sleep on Friday.  On Saturday morning she was still "hurting," and this has never been a "zero" since it began.  She also noted a choking feeling and she sought the attention of her primary M.D.  When in her primary M.D.s office on the way to the examining room it was reported that she had a syncopal episode.  Information from that office visit; i.e., blood pressure, pulse, etc., is not available.  The patient felt that she was only unconscious for a few seconds.  She denied any incontinence or seizure activity, or prior occurrence.   She denies any dysphagia of vocal changes.  REVIEW OF SYSTEMS: Essentially unremarkable except for chronic sinus problems, some arthralgias in the hands and knees, and she has gained about 10 pounds over the past year.  Her history is also notable for kidney stone in December 2002.  LABORATORY DATA: Admission H&H was 12.2 and 33.6, with normal indices, platelets 249,000; WBC 5.9.  Chemistry was not done.  D-dimer was less than 0.5.  CKs and troponins were negative for myocardial infarction.  Chest CT was negative for pulmonary embolism.  CT was performed of the sinuses - this did not reveal any acute sinusitis; however, she did have a septal deviation and injected turbinates.  Chest x-ray did not show any active disease.  TSH was 1.845.  Total cholesterol was 172, triglyceride 126, HDL 52, LDL 95.  HOSPITAL COURSE: Sarah [name] was admitted to the unit 2000.  It was noted on her admission EKG that she had some slight lateral T wave inversion; however, subsequent EKGs did not reveal this change.  CKs and enzymes were negative for myocardial infarction.  Dr. Jens Som and Dr. Diona Browner felt that this was probably an upper respiratory viral infection.  She was supplemented for her hypokalemia.  Her stools were checked for blood and they were heme positive on January 14, 2002.  Thus, she remained overnight.  Thus, an H&H was rechecked and her hemoglobin was 11.3, which was slightly decreased.  It was felt she  should remain overnight to evaluate potential GI problems.  On the morning of January 16, 2002 H&H was 11.4 and 32.4, slightly diminished from admission but stable as compared to January 14, 2002.  Sodium was 138, potassium 3.8, BUN 13, creatinine 0.6.  After review Dr. Andee Lineman felt that she could be discharged home.  He was notably concerned with her decreased H&H and heme positive stools and decreased albumin, and felt that she needs an outpatient GI evaluation and possible colonoscopy to  rule out colon cancer.  DISCHARGE DIAGNOSES:  1. Vlral syndrome.  2. Syncope, probably related to #1.  3. Hypokalemia, resolved.  4. Heme positive stools with slight decrease in her hemoglobin.  DISPOSITION: She was discharged home.  DISCHARGE MEDICATIONS:  1. Continue Premarin 0.125 mg q.d.  2. Prevacid 30 mg q.d.  3. Iron q.d.  DISCHARGE ACTIVITY: She was advised no work until seen by her primary M.D..  FOLLOW-UP: To make an appointment this week with Dr. Phillips Odor.  Dr. Phillips Odor will arrange a possible EP evaluation, but she was recommended to definitely see a GI physician; i.e., Dr. Karilyn Cota in [place], [place], in the near future.  [place], [place] office will call her at home with arrangements for an outpatient stress Cardiolite and a follow-up appointment. Dictated by:   Joellyn Rued, P.A.-C. Attending Physician:  Herbert Seta DD:  01/16/02 TD:  01/16/02 Job: [id number]

## 2011-02-20 NOTE — Procedures (Signed)
[facility]  Patient:    Sarah [name], Sarah [name]                        MRN: [id number] Proc. Date: 12/02/99 Adm. Date:  [id number] Disc. Date: [id number] Attending:  Orland Mustard CC:         Dr. Elfredia Nevins, [address]             Assoc, [address], [place]             [address]                           Procedure Report  PROCEDURE PERFORMED: 1. Esophageal manometry. 2. 24-hour pH probe.  ENDOSCOPIST:  Llana Aliment. Randa Evens, M.D.  INDICATIONS:  This procedure was done in anticipation of possible antireflux procedures.  The patient has had a long history of esophageal reflux. Endoscopy  showed a widely patent GE junction back in October.  She has continued to be markedly symptomatic in spite of aggressive medical therapy.  DESCRIPTION OF PROCEDURE:  The procedure was performed in the [facility] manometry lab.  The usual protocol was performed for manometry.  No provocative studies were obtained.  The patient had been off all acid reducing medicines for five days.  Following the manometry a 24-hour pH probe was placed and she had this probe in  place for essentially 24 hours.  The results of these studies are as follows.  Manometry: 1. Upper esophageal sphincter, normal relaxation, positive pharyngeal spikes. 2. Esophageal body mean amplitude in the esophagus 134 mm, mean duration    4.6 seconds.  Peristalsis in all 10 wet swallows appeared normal. 3. Lower esophageal sphincter 8 mm measured, relaxed completely with swallowing.    This was felt to be hypotensive.  24-hour pH probe: 1. Upright position 54 episodes of reflux during five hours 52 minutes.  4.3% of    the time, normal being less than 6.3%. Supine 76 episodes of reflux during    17 hours and 39 minutes.  Five episodes lasted over five minutes. The longest    lasted for 10 minutes.  Total time of 82 minutes of reflux 7.7% of the time.    Total 130 episodes of reflux, total  time of 97 minutes over 24 hours, 6.9% of    the time markedly abnormal.  Measured composite score was 40.3 with normal    being less than 22.  There were 22 episodes of reflux in the proximal probe    over 24 hours in the upright position and 35 episodes in the supine position.    The patient had a marked correlation between reflux and pain with periods of    chest pain associated with 26% of the time with reflux.  There was also a  ____________ associated with 24% with reflux coughing 17% with reflux.  IMPRESSION: 1. Hypotensive lower esophageal sphincter with normal esophageal peristalsis. 2. Significant gastroesophageal reflux disease, particularly in the supine    position as measured by 24 hour pH probe.  PLAN:  In view of this patients continuing symptoms, marked reflux on 24 hour probe, recommend that she consider an antireflux procedure. DD:  12/09/99 TD:  12/09/99 Job: [id number]

## 2011-02-20 NOTE — Procedures (Signed)
NAME:  Sarah [name], Sarah [name] NO.:  [id number]   MEDICAL RECORD NO.:  [id number]                   PATIENT TYPE:  OUT   LOCATION:  RAD                                  FACILITY:  [facility]   PHYSICIAN:  Vida Roller, M.D.                DATE OF BIRTH:  04/05/1935   DATE OF PROCEDURE:  DATE OF DISCHARGE:  10/22/2003                                    STRESS TEST   PROCEDURE:  Adenosine Cardiolite.   INDICATION:  [name] is a 60 year old woman with no known coronary artery  disease who presents with atypical chest discomfort.  Cardiac risk factors  include diabetes, hypertension and hyperlipidemia.   BASELINE DATA:  EKG revealed sinus rhythm at 80 beats per minute with  nonspecific ST abnormalities and blood pressure of 164/78.  Initially the  patient exercised on the treadmill.  She exercised for approximately two  minutes and 42 seconds to 4.6 mets.  She complained of fatigue; therefore,  exercise was stopped and changed to adenosine.  Maximum heart rate was 136  which is 88% of predicted maximum.  Maximum blood pressure was 198/90.   DESCRIPTION OF PROCEDURE:  Baseline data for adenosine Cardiolite.  EKG  revealed sinus rhythm at 88 beats per minute with nonspecific ST  abnormalities.  Blood pressure was 152/80.  Adenosine 42 mg was infused over  four minute protocol with Cardiolite injected at three minutes.  The patient  reported chest and abdominal pressure along with her discomfort.  She also  reported mild shortness of breath which resolved in recovery.  EKG revealed  no ischemic changes and no arrhythmias.   Final images and results are pending M.D. review.     ________________________________________  ___________________________________________  Jae Dire, P.A. [facility]                      Vida Roller, M.D.   AB/MEDQ  D:  11/02/2003  T:  11/02/2003  Job:  [id number]

## 2011-02-20 NOTE — H&P (Signed)
[facility]  Patient:    Sarah [name], Sarah [name] Visit Number: [id number] MRN: [id number]          Service Type: MED Location: 2000 2040 01 Attending Physician:  Jonelle Sidle Dictated by:   Franky Macho, M.D. Admit Date:  01/14/2002 Discharge Date: 01/16/2002   CC:         Colette Ribas, M.D.   History and Physical  DATE OF BIRTH:  1950-12-18  CHIEF COMPLAINT:  Recurrent GERD, hematochezia.  HISTORY OF PRESENT ILLNESS:  The patient is a 60 year old white female who is referred for endoscopic evaluation.  She has been having hematochezia lately. She states she has had a hard time moving her bowels.  She was also found to be anemic recently.  She denies any weight loss, fever, abdominal pain, constipation, diarrhea, or melena.  She has never had a colonoscopy.  The patient denies any hemorrhoidal problems or family history of colon carcinoma. She also states that sometimes she has had recurrent heartburn.  She takes Prevacid daily at the present time; this seems to help her reflux.  No nausea, vomiting, jaundice, or history of peptic ulcer disease are noted.  PAST MEDICAL HISTORY:  Past medical history is as noted above, recent episode of syncope for which she is getting a stress test tomorrow, history of sinusitis.  PAST SURGICAL HISTORY:  Laparoscopic Nissen fundoplication in 2001, laparoscopic cholecystectomy in 1996, C-section, partial hysterectomy, multiple breast biopsies.  CURRENT MEDICATIONS:  Prevacid one tablet p.o. b.i.d.  ALLERGIES:  PENICILLIN.  REVIEW OF SYSTEMS:  Unremarkable.  The patient denies having an MI, chest pain, shortness of breath, leg swelling, CVA, or diabetes mellitus.  PHYSICAL EXAMINATION:  GENERAL:  On physical examination, the patient is a well-developed, well-nourished white female in no acute distress.  VITAL SIGNS:  She is afebrile and vital signs are stable.  LUNGS:  Clear to auscultation  with equal breath sounds bilaterally.  HEART:  Regular rate and rhythm without S3, S4, or murmurs.  ABDOMEN:  Soft, nontender, and nondistended.  No hepatosplenomegaly or masses are noted.  RECTAL:  Deferred to the procedure.  IMPRESSION: 1. Hematochezia. 2. Anemia. 3. Recurrent gastroesophageal reflux disease.  PLAN:  The patient is scheduled for an EGD and colonoscopy on Feb 02, 2002. The risks and benefits of the procedure including bleeding and perforation were fully explained to the patient, who gave informed consent.  Dictated by: Franky Macho, M.D. Attending Physician:  Jonelle Sidle DD:  01/26/02 TD:  01/27/02 Job: [id number]

## 2013-02-20 ENCOUNTER — Ambulatory Visit
Payer: No Typology Code available for payment source | Attending: Physical Medicine and Rehabilitation | Admitting: Physical Therapy

## 2013-02-20 DIAGNOSIS — IMO0001 Reserved for inherently not codable concepts without codable children: Secondary | ICD-10-CM | POA: Insufficient documentation

## 2013-02-20 DIAGNOSIS — M542 Cervicalgia: Secondary | ICD-10-CM | POA: Insufficient documentation

## 2013-02-20 DIAGNOSIS — R5381 Other malaise: Secondary | ICD-10-CM | POA: Insufficient documentation

## 2013-02-20 DIAGNOSIS — M2560 Stiffness of unspecified joint, not elsewhere classified: Secondary | ICD-10-CM | POA: Insufficient documentation

## 2013-02-23 ENCOUNTER — Ambulatory Visit: Payer: No Typology Code available for payment source | Admitting: Physical Therapy

## 2013-03-02 ENCOUNTER — Encounter: Payer: No Typology Code available for payment source | Admitting: Physical Therapy

## 2013-06-27 ENCOUNTER — Other Ambulatory Visit (HOSPITAL_COMMUNITY): Payer: Self-pay | Admitting: Family Medicine

## 2013-06-27 DIAGNOSIS — E041 Nontoxic single thyroid nodule: Secondary | ICD-10-CM

## 2013-06-28 ENCOUNTER — Ambulatory Visit (HOSPITAL_COMMUNITY)
Admission: RE | Admit: 2013-06-28 | Discharge: 2013-06-28 | Disposition: A | Discharge status: Home / Self Care | Payer: 59 | Source: Ambulatory Visit | Attending: Family Medicine | Admitting: Family Medicine

## 2013-06-28 DIAGNOSIS — E041 Nontoxic single thyroid nodule: Secondary | ICD-10-CM | POA: Insufficient documentation

## 2013-06-28 MED ORDER — SODIUM CHLORIDE 0.9 % IJ SOLN
INTRAMUSCULAR | Status: AC
  Filled 2013-06-28: qty 250

## 2013-07-03 ENCOUNTER — Ambulatory Visit (HOSPITAL_COMMUNITY): Payer: No Typology Code available for payment source

## 2013-10-09 ENCOUNTER — Encounter: Payer: Self-pay | Admitting: Obstetrics and Gynecology

## 2013-10-09 ENCOUNTER — Ambulatory Visit (INDEPENDENT_AMBULATORY_CARE_PROVIDER_SITE_OTHER): Payer: 59 | Admitting: Obstetrics and Gynecology

## 2013-10-09 ENCOUNTER — Encounter (INDEPENDENT_AMBULATORY_CARE_PROVIDER_SITE_OTHER): Payer: Self-pay

## 2013-10-09 VITALS — BP 142/82 | Ht 62.5 in | Wt 149.6 lb

## 2013-10-09 DIAGNOSIS — N951 Menopausal and female climacteric states: Secondary | ICD-10-CM

## 2013-10-09 MED ORDER — ESTRADIOL 1 MG PO TABS: 1.0000 mg | ORAL_TABLET | Freq: Every day | ORAL | Status: DC

## 2013-10-09 NOTE — Progress Notes (Signed)
Patient ID: Sarah [name], female   DOB: 09-04-1951, 63 y.o.   MRN: [id number]  Chief Complaint  Patient presents with  . Annual Exam    HPI  HPI  Sarah [name] is a 63 y.o. Female presents with RLQ that began years ago. She reports having hot flashes as well. She reports recently using a douche and noticed some brown vaginal discharge afterwards. She reports using a second douche the following night without any discharge. She denies any change in bowel or bladder function, diarrhea, dysuria. She denies cardiac disease. She has passive smoke exposure. She denies taking a daily baby ASA. Her last mammogram was 5 years ago. She states her height has decreased from 5'4" to 5'2".    PCP is Dr. Hilma Favors Past Medical History  Diagnosis Date  . Neck pain, chronic   . Reflux     Past Surgical History  Procedure Laterality Date  . Abdominal hysterectomy    . Cholecystectomy    . Breast cyst excision     Prior to Admission medications   Medication Sig Start Date End Date Taking? Authorizing Provider  Incontinence Supplies (ANTI-REFLUX VALVE) MISC by Does not apply route.   Yes Historical Provider, MD  Misc Natural Products (HOT FLASHEX PO) Take by mouth.   Yes Historical Provider, MD  oxyCODONE-acetaminophen (PERCOCET) 10-325 MG per tablet Take 1 tablet by mouth every 4 (four) hours as needed for pain.   Yes Historical Provider, MD     Family History  Problem Relation Age of Onset  . Heart disease Mother   . Cancer Maternal Aunt   . Diabetes Maternal Grandmother   . Heart disease Maternal Grandmother     Social History History  Substance Use Topics  . Smoking status: Never Smoker   . Smokeless tobacco: Never Used  . Alcohol Use: No    Allergies  Allergen Reactions  . Penicillins Itching    Current Outpatient Prescriptions  Medication Sig Dispense Refill  . Incontinence Supplies (ANTI-REFLUX VALVE) MISC by Does not apply route.      . Misc Natural Products (HOT FLASHEX  PO) Take by mouth.      . oxyCODONE-acetaminophen (PERCOCET) 10-325 MG per tablet Take 1 tablet by mouth every 4 (four) hours as needed for pain.       No current facility-administered medications for this visit.    Review of Systems Review of Systems  Constitutional:       Hot flashes  Gastrointestinal: Positive for abdominal pain (RLQ). Negative for diarrhea.  Genitourinary: Negative for dysuria and frequency.    Blood pressure 142/82, height 5' 2.5" (1.588 m), weight 149 lb 9.6 oz (67.858 kg).  Physical Exam Physical Exam  Vitals reviewed. Constitutional: She is oriented to person, place, and time. She appears well-developed and well-nourished. No distress.  HENT:  Head: Normocephalic and atraumatic.  Neck: Normal range of motion.  Cardiovascular: Normal rate.   Pulmonary/Chest: Effort normal. No respiratory distress.  Abdominal: Soft. She exhibits no distension and no mass. There is no tenderness. There is no rebound and no guarding.  Genitourinary: Vagina normal.  Atrophic,good support at entrance, slight laxity  Musculoskeletal: Normal range of motion.  Neurological: She is alert and oriented to person, place, and time.  Skin: Skin is warm and dry. She is not diaphoretic.  Psychiatric: She has a normal mood and affect. Her behavior is normal.    Data Reviewed Hx , ROS  Assessment    RLQ pain that is stable  and  post menopausal vasomotor symptoms    Plan  begin oral estrogen tx Estrace 1 mg daily ref x 27yr Advised to follow up for a mammogram as well as begin taking a baby ASA.        , V 10/09/2013, 11:56 AM

## 2013-10-09 NOTE — Patient Instructions (Signed)
Begin  Estrogen tx,  Take vit D, and multivitamins.

## 2014-05-15 ENCOUNTER — Encounter: Payer: Self-pay | Admitting: Gastroenterology

## 2014-09-30 ENCOUNTER — Other Ambulatory Visit: Payer: Self-pay | Admitting: Obstetrics and Gynecology

## 2015-07-24 ENCOUNTER — Other Ambulatory Visit: Payer: Self-pay | Admitting: Obstetrics and Gynecology

## 2015-07-31 ENCOUNTER — Other Ambulatory Visit: Payer: Self-pay | Admitting: Obstetrics and Gynecology

## 2015-08-01 ENCOUNTER — Ambulatory Visit (HOSPITAL_COMMUNITY): Payer: PRIVATE HEALTH INSURANCE

## 2015-08-01 ENCOUNTER — Other Ambulatory Visit (HOSPITAL_COMMUNITY): Payer: Self-pay | Admitting: Family Medicine

## 2015-08-01 ENCOUNTER — Other Ambulatory Visit: Payer: Self-pay | Admitting: Obstetrics and Gynecology

## 2015-08-01 ENCOUNTER — Ambulatory Visit (HOSPITAL_COMMUNITY)
Admission: RE | Admit: 2015-08-01 | Discharge: 2015-08-01 | Disposition: A | Discharge status: Home / Self Care | Payer: 59 | Source: Ambulatory Visit | Attending: Family Medicine | Admitting: Family Medicine

## 2015-08-01 DIAGNOSIS — R0989 Other specified symptoms and signs involving the circulatory and respiratory systems: Secondary | ICD-10-CM | POA: Diagnosis not present

## 2015-08-01 DIAGNOSIS — R0602 Shortness of breath: Secondary | ICD-10-CM | POA: Diagnosis present

## 2015-08-01 DIAGNOSIS — R05 Cough: Secondary | ICD-10-CM | POA: Insufficient documentation

## 2015-08-01 DIAGNOSIS — J069 Acute upper respiratory infection, unspecified: Secondary | ICD-10-CM

## 2015-08-15 ENCOUNTER — Other Ambulatory Visit: Payer: Self-pay | Admitting: Obstetrics and Gynecology

## 2015-10-16 ENCOUNTER — Ambulatory Visit (INDEPENDENT_AMBULATORY_CARE_PROVIDER_SITE_OTHER): Payer: 59 | Admitting: Obstetrics and Gynecology

## 2015-10-16 ENCOUNTER — Encounter: Payer: Self-pay | Admitting: Obstetrics and Gynecology

## 2015-10-16 ENCOUNTER — Other Ambulatory Visit: Payer: Self-pay | Admitting: Obstetrics and Gynecology

## 2015-10-16 VITALS — BP 130/82 | Ht 62.0 in | Wt 165.0 lb

## 2015-10-16 DIAGNOSIS — Z01419 Encounter for gynecological examination (general) (routine) without abnormal findings: Secondary | ICD-10-CM

## 2015-10-16 DIAGNOSIS — Z1231 Encounter for screening mammogram for malignant neoplasm of breast: Secondary | ICD-10-CM

## 2015-10-16 DIAGNOSIS — Z0000 Encounter for general adult medical examination without abnormal findings: Secondary | ICD-10-CM

## 2015-10-16 DIAGNOSIS — N816 Rectocele: Secondary | ICD-10-CM | POA: Insufficient documentation

## 2015-10-16 NOTE — Progress Notes (Signed)
Patient ID: Sarah [name], female   DOB: 06-Dec-1950, 65 y.o.   MRN: [id number]  Assessment:  Annual Gyn Exam Recurrent rectocele  Fibromyalgia stable   Plan:  1. pap smear not indicated s/p total abdominal hysterectomy  2. return annually or prn 3    Annual mammogram advised 4.   Rectocele repair   Subjective:  Sarah [name] is a 65 y.o. female with h/o fibromyalgia, arthritis, total abdominal hysterectomy who presents for annual exam. No LMP recorded. Patient has had a hysterectomy.  The patient has complaints today of mild to moderate difficulty passing stool and blood streaking in the stool. She notes that she is taking Miralax with some relief. Pt states she is resorting to splinting most of the time to aid in defecation. She notes h/o similar difficulty with stool >20 years ago and had posterior repair with vaginal hysterectomy. She had a rectocele and pelvic relaxation.  Pt also reports urinary frequency and stress incontinence. She is not currently sexually active. Pt states her PCP is Dr. Hilma Favors. Pt notes her fibromyalgia is well controlled with medication and seldom prevents her from completing daily activities.  She is not sexually active x 8 yr, since husband's death.  P  The following portions of the patient's history were reviewed and updated as appropriate: allergies, current medications, past family history, past medical history, past social history, past surgical history and problem list. Past Medical History  Diagnosis Date  . Neck pain, chronic   . Reflux   . Fibromyalgia   . Arthritis     Past Surgical History  Procedure Laterality Date  . Abdominal hysterectomy    . Cholecystectomy    . Breast cyst excision       Current outpatient prescriptions:  .  estradiol (ESTRACE) 1 MG tablet, TAKE 1 TABLET (1 MG TOTAL) BY MOUTH DAILY., Disp: 30 tablet, Rfl: 9 .  Incontinence Supplies (ANTI-REFLUX VALVE) MISC, by Does not apply route., Disp: , Rfl:  .  Misc Natural  Products (HOT FLASHEX PO), Take by mouth., Disp: , Rfl:  .  oxyCODONE-acetaminophen (PERCOCET) 10-325 MG per tablet, Take 1 tablet by mouth every 4 (four) hours as needed for pain., Disp: , Rfl:   Review of Systems Constitutional: negative Gastrointestinal: positive for bowel straining and blood streaking in the stool Genitourinary: positive for frequency and stress incontinence   Objective:  BP 130/82 mmHg  Ht 5\' 2"  (1.575 m)  Wt 165 lb (74.844 kg)  BMI 30.17 kg/m2   BMI: Body mass index is 30.17 kg/(m^2).  General Appearance: Alert, appropriate appearance for age. No acute distress HEENT: Grossly normal Neck / Thyroid:  Cardiovascular: RRR; normal S1, S2, no murmur Lungs: CTA bilaterally Back: No CVAT Breast Exam: No dimpling, nipple retraction or discharge. No masses or nodes. and No masses or nodes.No dimpling, nipple retraction or discharge. Gastrointestinal: Soft, non-tender, no masses or organomegaly Pelvic Exam: External genitalia: normal general appearance Vaginal: normal mucosa without prolapse or lesions and Good support at the back wall. Short vaginal length at approximately 10 cm Cervix: absent and removed surgically Adnexa: removed surgically Uterus: absent and removed surgically Rectal: High rectocele in the upper half of the vagina to greater than 90 degrees  Lymphatic Exam: Non-palpable nodes in neck, clavicular, axillary, or inguinal regions  Skin: no rash or abnormalities Neurologic: Normal gait and speech, no tremor  Psychiatric: Alert and oriented, appropriate affect.   Urinalysis:Not done  Mallory Shirk. MD Pgr [phone] 8:56 AM  By signing my name below, I, Hansel Feinstein, attest that this documentation has been prepared under the direction and in the presence of Jonnie Kind, MD. Electronically Signed: Hansel Feinstein, ED Scribe. 10/16/2015. 9:03 AM.  I personally performed the services described in this documentation, which was SCRIBED in my  presence. The recorded information has been reviewed and considered accurate. It has been edited as necessary during review. Jonnie Kind, MD

## 2015-10-16 NOTE — Patient Instructions (Signed)

## 2015-10-23 ENCOUNTER — Ambulatory Visit (HOSPITAL_COMMUNITY)
Admission: RE | Admit: 2015-10-23 | Discharge: 2015-10-23 | Disposition: A | Discharge status: Home / Self Care | Payer: 59 | Source: Ambulatory Visit | Attending: Obstetrics and Gynecology | Admitting: Obstetrics and Gynecology

## 2015-10-23 DIAGNOSIS — Z1231 Encounter for screening mammogram for malignant neoplasm of breast: Secondary | ICD-10-CM | POA: Insufficient documentation

## 2015-11-01 ENCOUNTER — Inpatient Hospital Stay (HOSPITAL_COMMUNITY): Admission: RE | Admit: 2015-11-01 | Payer: PRIVATE HEALTH INSURANCE | Source: Ambulatory Visit

## 2015-11-05 ENCOUNTER — Ambulatory Visit: Admit: 2015-11-05 | Payer: PRIVATE HEALTH INSURANCE | Admitting: Obstetrics and Gynecology

## 2015-11-05 SURGERY — COLPORRHAPHY, POSTERIOR, FOR RECTOCELE REPAIR
Anesthesia: Choice

## 2015-11-07 ENCOUNTER — Other Ambulatory Visit (HOSPITAL_COMMUNITY): Payer: PRIVATE HEALTH INSURANCE

## 2015-11-09 ENCOUNTER — Emergency Department (HOSPITAL_COMMUNITY)
Admission: EM | Admit: 2015-11-09 | Discharge: 2015-11-09 | Disposition: A | Discharge status: Home / Self Care | Payer: 59 | Attending: Emergency Medicine | Admitting: Emergency Medicine

## 2015-11-09 ENCOUNTER — Emergency Department (HOSPITAL_COMMUNITY): Payer: 59

## 2015-11-09 ENCOUNTER — Encounter (HOSPITAL_COMMUNITY): Payer: Self-pay | Admitting: Emergency Medicine

## 2015-11-09 DIAGNOSIS — J45901 Unspecified asthma with (acute) exacerbation: Secondary | ICD-10-CM | POA: Diagnosis not present

## 2015-11-09 DIAGNOSIS — J159 Unspecified bacterial pneumonia: Secondary | ICD-10-CM | POA: Insufficient documentation

## 2015-11-09 DIAGNOSIS — G8929 Other chronic pain: Secondary | ICD-10-CM | POA: Diagnosis not present

## 2015-11-09 DIAGNOSIS — R0602 Shortness of breath: Secondary | ICD-10-CM | POA: Diagnosis present

## 2015-11-09 DIAGNOSIS — Z79899 Other long term (current) drug therapy: Secondary | ICD-10-CM | POA: Insufficient documentation

## 2015-11-09 DIAGNOSIS — Z8739 Personal history of other diseases of the musculoskeletal system and connective tissue: Secondary | ICD-10-CM | POA: Diagnosis not present

## 2015-11-09 DIAGNOSIS — Z793 Long term (current) use of hormonal contraceptives: Secondary | ICD-10-CM | POA: Diagnosis not present

## 2015-11-09 DIAGNOSIS — Z880 Allergy status to penicillin: Secondary | ICD-10-CM | POA: Insufficient documentation

## 2015-11-09 DIAGNOSIS — J189 Pneumonia, unspecified organism: Secondary | ICD-10-CM

## 2015-11-09 DIAGNOSIS — K219 Gastro-esophageal reflux disease without esophagitis: Secondary | ICD-10-CM | POA: Insufficient documentation

## 2015-11-09 LAB — BASIC METABOLIC PANEL
ANION GAP: 15 (ref 5–15)
BUN: 14 mg/dL (ref 6–20)
CALCIUM: 8.7 mg/dL — AB (ref 8.9–10.3)
CHLORIDE: 103 mmol/L (ref 101–111)
CO2: 19 mmol/L — ABNORMAL LOW (ref 22–32)
CREATININE: 0.68 mg/dL (ref 0.44–1.00)
Glucose, Bld: 157 mg/dL — ABNORMAL HIGH (ref 65–99)
Potassium: 3 mmol/L — ABNORMAL LOW (ref 3.5–5.1)
Sodium: 137 mmol/L (ref 135–145)

## 2015-11-09 LAB — CBC WITH DIFFERENTIAL/PLATELET
BASOS PCT: 0 %
Basophils Absolute: 0 10*3/uL (ref 0.0–0.1)
EOS ABS: 0 10*3/uL (ref 0.0–0.7)
Eosinophils Relative: 0 %
HCT: 36.7 % (ref 36.0–46.0)
HEMOGLOBIN: 12.8 g/dL (ref 12.0–15.0)
Lymphocytes Relative: 6 %
Lymphs Abs: 0.9 10*3/uL (ref 0.7–4.0)
MCH: 31.1 pg (ref 26.0–34.0)
MCHC: 34.9 g/dL (ref 30.0–36.0)
MCV: 89.3 fL (ref 78.0–100.0)
Monocytes Absolute: 0.2 10*3/uL (ref 0.1–1.0)
Monocytes Relative: 2 %
NEUTROS PCT: 92 %
Neutro Abs: 14.4 10*3/uL — ABNORMAL HIGH (ref 1.7–7.7)
Platelets: 248 10*3/uL (ref 150–400)
RBC: 4.11 MIL/uL (ref 3.87–5.11)
RDW: 12.2 % (ref 11.5–15.5)
WBC: 15.5 10*3/uL — AB (ref 4.0–10.5)

## 2015-11-09 LAB — I-STAT CG4 LACTIC ACID, ED: LACTIC ACID, VENOUS: 1.72 mmol/L (ref 0.5–2.0)

## 2015-11-09 MED ORDER — PREDNISONE 50 MG PO TABS
60.0000 mg | ORAL_TABLET | Freq: Once | ORAL | Status: AC
  Administered 2015-11-09: 60 mg via ORAL
  Filled 2015-11-09: qty 1

## 2015-11-09 MED ORDER — IOHEXOL 350 MG/ML SOLN
100.0000 mL | Freq: Once | INTRAVENOUS | Status: AC | PRN
  Administered 2015-11-09: 100 mL via INTRAVENOUS

## 2015-11-09 MED ORDER — SODIUM CHLORIDE 0.9 % IV BOLUS (SEPSIS)
2000.0000 mL | Freq: Once | INTRAVENOUS | Status: AC
  Administered 2015-11-09: 2000 mL via INTRAVENOUS

## 2015-11-09 MED ORDER — IPRATROPIUM BROMIDE 0.02 % IN SOLN
0.5000 mg | Freq: Once | RESPIRATORY_TRACT | Status: AC
  Administered 2015-11-09: 0.5 mg via RESPIRATORY_TRACT
  Filled 2015-11-09: qty 2.5

## 2015-11-09 MED ORDER — LEVOFLOXACIN 750 MG PO TABS
750.0000 mg | ORAL_TABLET | Freq: Once | ORAL | Status: AC
  Administered 2015-11-09: 750 mg via ORAL
  Filled 2015-11-09: qty 1

## 2015-11-09 MED ORDER — LEVOFLOXACIN 750 MG PO TABS: 750.0000 mg | ORAL_TABLET | Freq: Every day | ORAL | Status: DC

## 2015-11-09 MED ORDER — KETOROLAC TROMETHAMINE 30 MG/ML IJ SOLN
30.0000 mg | Freq: Once | INTRAMUSCULAR | Status: AC
  Administered 2015-11-09: 30 mg via INTRAVENOUS
  Filled 2015-11-09: qty 1

## 2015-11-09 MED ORDER — ALBUTEROL SULFATE (2.5 MG/3ML) 0.083% IN NEBU
5.0000 mg | INHALATION_SOLUTION | Freq: Once | RESPIRATORY_TRACT | Status: AC
  Administered 2015-11-09: 5 mg via RESPIRATORY_TRACT
  Filled 2015-11-09: qty 6

## 2015-11-09 NOTE — Discharge Instructions (Signed)
Get plenty of rest and drink a lot of fluids. Use Tylenol every 4 hours if needed for fever. Follow-up with your doctor for checkup in 2 or 3 days. Return here if your symptoms worsen.    Community-Acquired Pneumonia, Adult Pneumonia is an infection of the lungs. There are different types of pneumonia. One type can develop while a person is in a hospital. A different type, called community-acquired pneumonia, develops in people who are not, or have not recently been, in the hospital or other health care facility.  CAUSES Pneumonia may be caused by bacteria, viruses, or funguses. Community-acquired pneumonia is often caused by Streptococcus pneumonia bacteria. These bacteria are often passed from one person to another by breathing in droplets from the cough or sneeze of an infected person. RISK FACTORS The condition is more likely to develop in:  People who havechronic diseases, such as chronic obstructive pulmonary disease (COPD), asthma, congestive heart failure, cystic fibrosis, diabetes, or kidney disease.  People who haveearly-stage or late-stage HIV.  People who havesickle cell disease.  People who havehad their spleen removed (splenectomy).  People who havepoor Human resources officer.  People who havemedical conditions that increase the risk of breathing in (aspirating) secretions their own mouth and nose.   People who havea weakened immune system (immunocompromised).  People who smoke.  People whotravel to areas where pneumonia-causing germs commonly exist.  People whoare around animal habitats or animals that have pneumonia-causing germs, including birds, bats, rabbits, cats, and farm animals. SYMPTOMS Symptoms of this condition include:  Adry cough.  A wet (productive) cough.  Fever.  Sweating.  Chest pain, especially when breathing deeply or coughing.  Rapid breathing or difficulty breathing.  Shortness of breath.  Shaking chills.  Fatigue.  Muscle  aches. DIAGNOSIS Your health care provider will take a medical history and perform a physical exam. You may also have other tests, including:  Imaging studies of your chest, including X-rays.  Tests to check your blood oxygen level and other blood gases.  Other tests on blood, mucus (sputum), fluid around your lungs (pleural fluid), and urine. If your pneumonia is severe, other tests may be done to identify the specific cause of your illness. TREATMENT The type of treatment that you receive depends on many factors, such as the cause of your pneumonia, the medicines you take, and other medical conditions that you have. For most adults, treatment and recovery from pneumonia may occur at home. In some cases, treatment must happen in a hospital. Treatment may include:  Antibiotic medicines, if the pneumonia was caused by bacteria.  Antiviral medicines, if the pneumonia was caused by a virus.  Medicines that are given by mouth or through an IV tube.  Oxygen.  Respiratory therapy. Although rare, treating severe pneumonia may include:  Mechanical ventilation. This is done if you are not breathing well on your own and you cannot maintain a safe blood oxygen level.  Thoracentesis. This procedureremoves fluid around one lung or both lungs to help you breathe better. HOME CARE INSTRUCTIONS  Take over-the-counter and prescription medicines only as told by your health care provider.  Only takecough medicine if you are losing sleep. Understand that cough medicine can prevent your body's natural ability to remove mucus from your lungs.  If you were prescribed an antibiotic medicine, take it as told by your health care provider. Do not stop taking the antibiotic even if you start to feel better.  Sleep in a semi-upright position at night. Try sleeping in a reclining  chair, or place a few pillows under your head.  Do not use tobacco products, including cigarettes, chewing tobacco, and  e-cigarettes. If you need help quitting, ask your health care provider.  Drink enough water to keep your urine clear or pale yellow. This will help to thin out mucus secretions in your lungs. PREVENTION There are ways that you can decrease your risk of developing community-acquired pneumonia. Consider getting a pneumococcal vaccine if:  You are older than 63 years of age.  You are older than 63 years of age and are undergoing cancer treatment, have chronic lung disease, or have other medical conditions that affect your immune system. Ask your health care provider if this applies to you. There are different types and schedules of pneumococcal vaccines. Ask your health care provider which vaccination option is best for you. You may also prevent community-acquired pneumonia if you take these actions:  Get an influenza vaccine every year. Ask your health care provider which type of influenza vaccine is best for you.  Go to the dentist on a regular basis.  Wash your hands often. Use hand sanitizer if soap and water are not available. SEEK MEDICAL CARE IF:  You have a fever.  You are losing sleep because you cannot control your cough with cough medicine. SEEK IMMEDIATE MEDICAL CARE IF:  You have worsening shortness of breath.  You have increased chest pain.  Your sickness becomes worse, especially if you are an older adult or have a weakened immune system.  You cough up blood.   This information is not intended to replace advice given to you by your health care provider. Make sure you discuss any questions you have with your health care provider.   Document Released: 09/21/2005 Document Revised: 06/12/2015 Document Reviewed: 01/16/2015 Elsevier Interactive Patient Education [employer].

## 2015-11-09 NOTE — ED Notes (Signed)
Having SOB since last Sunday.  Coughing up white, clear and black secretions.

## 2015-11-09 NOTE — ED Provider Notes (Signed)
CSN: [id number]     Arrival date & time 11/09/15  1159 History  By signing my name below, I, Stephania Fragmin, attest that this documentation has been prepared under the direction and in the presence of Daleen Bo, MD. Electronically Signed: Stephania Fragmin, ED Scribe. 11/09/2015. 7:48 PM.   Chief Complaint  Patient presents with  . Shortness of Breath   The history is provided by the patient. No language interpreter was used.    HPI Comments: Sarah [name] is a 65 y.o. female with a history of GERD, fibromyalgia, and arthritis, who presents to the Emergency Department complaining of SOB that began 6 days ago. She complains of an associated cough that is productive with white and occasionally dark sputum, nasal congestion, sore throat, and subjective fever. Patient states she had had similar symptoms last month and was given an antibiotic, which resolved her symptoms until they started again. She had tried taking Tylenol, Aleve, Nyquil, and Benadryl with no relief. She denies a history of smoking or any current pulmonary conditions, although she states she did have asthma "a long time ago." She states she has been around secondhand smoke occasionally, but denies living in any area that is exposed to wood smoke. She also denies a history of any cardiac conditions or DM. Patient states she had a flu vaccine this year.  Past Medical History  Diagnosis Date  . Neck pain, chronic   . Reflux   . Fibromyalgia   . Arthritis    Past Surgical History  Procedure Laterality Date  . Abdominal hysterectomy    . Cholecystectomy    . Breast cyst excision     Family History  Problem Relation Age of Onset  . Heart disease Mother   . Cancer Maternal Aunt   . Diabetes Maternal Grandmother   . Heart disease Maternal Grandmother    Social History  Substance Use Topics  . Smoking status: Never Smoker   . Smokeless tobacco: Never Used  . Alcohol Use: No   OB History    No data available     Review of  Systems A complete 10 system review of systems was obtained and all systems are negative except as noted in the HPI and PMH.    Allergies  Penicillins  Home Medications   Prior to Admission medications   Medication Sig Start Date End Date Taking? Authorizing Provider  acetaminophen (TYLENOL) 650 MG CR tablet Take 650 mg by mouth every 8 (eight) hours as needed for pain.   Yes Historical Provider, MD  diazepam (VALIUM) 5 MG tablet Take 5 mg by mouth every 6 (six) hours as needed for anxiety.   Yes Historical Provider, MD  DM-Doxylamine-Acetaminophen (NYQUIL COLD & FLU PO) Take 2 tablets by mouth 2 (two) times daily.   Yes Historical Provider, MD  esomeprazole (NEXIUM) 40 MG capsule Take 40 mg by mouth daily at 12 noon.   Yes Historical Provider, MD  estradiol (ESTRACE) 1 MG tablet TAKE 1 TABLET (1 MG TOTAL) BY MOUTH DAILY. 08/03/15  Yes Jonnie Kind, MD  oxyCODONE-acetaminophen (PERCOCET) 10-325 MG per tablet Take 1 tablet by mouth every 4 (four) hours as needed for pain.   Yes Historical Provider, MD  Incontinence Supplies (ANTI-REFLUX VALVE) MISC by Does not apply route.    Historical Provider, MD  levofloxacin (LEVAQUIN) 750 MG tablet Take 1 tablet (750 mg total) by mouth daily. X 7 days 11/09/15   Daleen Bo, MD  Misc Natural Products (HOT FLASHEX PO)  Take 1 tablet by mouth daily.     Historical Provider, MD   BP 151/99 mmHg  Pulse 105  Temp(Src) 97.7 F (36.5 C) (Oral)  Resp 22  Ht 5\' 2"  (1.575 m)  Wt 165 lb (74.844 kg)  BMI 30.17 kg/m2  SpO2 97% Physical Exam  Constitutional: She is oriented to person, place, and time. She appears well-developed and well-nourished.  Altered voice.   HENT:  Head: Normocephalic and atraumatic.  Eyes: Conjunctivae and EOM are normal. Pupils are equal, round, and reactive to light.  Neck: Normal range of motion and phonation normal. Neck supple.  Cardiovascular: Normal rate, regular rhythm and normal heart sounds.  Exam reveals no gallop and  no friction rub.   No murmur heard. Pulmonary/Chest: Effort normal and breath sounds normal. No respiratory distress. She has no wheezes. She has no rales. She exhibits no tenderness.  Lungs are clear to auscultation. Cough.  Abdominal: Soft. She exhibits no distension. There is no tenderness. There is no guarding.  Musculoskeletal: Normal range of motion.  Neurological: She is alert and oriented to person, place, and time. She exhibits normal muscle tone.  Skin: Skin is warm and dry.  Psychiatric: She has a normal mood and affect. Her behavior is normal. Judgment and thought content normal.  Nursing note and vitals reviewed.   ED Course  Procedures (including critical care time)  DIAGNOSTIC STUDIES: Oxygen Saturation is 100% on RA, normal by my interpretation.    COORDINATION OF CARE: 12:49 PM - Pt made aware of CXR results. Discussed treatment plan with pt at bedside. Pt verbalized understanding and agreed to plan.   Medications  levofloxacin (LEVAQUIN) tablet 750 mg (750 mg Oral Given 11/09/15 1321)  predniSONE (DELTASONE) tablet 60 mg (60 mg Oral Given 11/09/15 1321)  albuterol (PROVENTIL) (2.5 MG/3ML) 0.083% nebulizer solution 5 mg (5 mg Nebulization Given 11/09/15 1316)  ipratropium (ATROVENT) nebulizer solution 0.5 mg (0.5 mg Nebulization Given 11/09/15 1316)  sodium chloride 0.9 % bolus 2,000 mL (0 mLs Intravenous Stopped 11/09/15 1936)  ketorolac (TORADOL) 30 MG/ML injection 30 mg (30 mg Intravenous Given 11/09/15 1833)  iohexol (OMNIPAQUE) 350 MG/ML injection 100 mL (100 mLs Intravenous Contrast Given 11/09/15 1809)    Patient Vitals for the past 24 hrs:  BP Temp Temp src Pulse Resp SpO2 Height Weight  11/09/15 1937 151/99 mmHg 97.7 F (36.5 C) Oral 105 22 97 % - -  11/09/15 1830 - - - 103 23 98 % - -  11/09/15 1800 149/96 mmHg - - 111 (!) 28 (!) 89 % - -  11/09/15 1621 150/82 mmHg 98.1 F (36.7 C) Oral 111 24 97 % - -  11/09/15 1530 - - - (!) 135 22 96 % - -  11/09/15 1500 - - -  (!) 131 22 99 % - -  11/09/15 1430 - - - (!) 140 26 97 % - -  11/09/15 1400 - - - (!) 125 24 94 % - -  11/09/15 1327 - - - - - 100 % - -  11/09/15 1208 131/83 mmHg 97.8 F (36.6 C) Oral 103 14 100 % 5\' 2"  (1.575 m) 165 lb (74.844 kg)    5:03 PM Reevaluation with update and discussion. After initial assessment and treatment, an updated evaluation reveals she is uncomfortable, panting and coughing. O2 sat 100 %. Will order CTA for possible PE. . [place] PANEL - Abnormal; Notable for the following:  Potassium 3.0 (*)    CO2 19 (*)    Glucose, Bld 157 (*)    Calcium 8.7 (*)    All other components within normal limits  CBC WITH DIFFERENTIAL/PLATELET - Abnormal; Notable for the following:    WBC 15.5 (*)    Neutro Abs 14.4 (*)    All other components within normal limits  I-STAT CG4 LACTIC ACID, ED    Imaging Review Dg Chest 2 View  11/09/2015  CLINICAL DATA:  SOB, Having SOB since last Sunday. Coughing up white, clear and black secretions. HISTORY OF FIBROMYALGIA, ARTHRITIS EXAM: CHEST - 2 VIEW COMPARISON:  08/01/2015 FINDINGS: New poorly marginated opacities in the left mid lung and peripherally in the right mid lung, new since prior exam. No overt interstitial edema. Heart size remains normal. No effusion.  No pneumothorax. Visualized skeletal structures are unremarkable. Surgical clips right upper abdomen. IMPRESSION: 1. Ill-defined bilateral airspace opacities possibly early pneumonia. Consider follow-up to confirm appropriate resolution. Electronically Signed   By: Lucrezia Europe M.D.   On: 11/09/2015 12:45   Ct Angio Chest Pe W/cm &/or Wo Cm  11/09/2015  CLINICAL DATA:  Shortness of breath for 6 days, productive cough EXAM: CT ANGIOGRAPHY CHEST WITH CONTRAST TECHNIQUE: Multidetector CT imaging of the chest was performed using the standard protocol during bolus administration of intravenous contrast. Multiplanar CT image  reconstructions and MIPs were obtained to evaluate the vascular anatomy. CONTRAST:  120mL OMNIPAQUE IOHEXOL 350 MG/ML SOLN COMPARISON:  11/09/2015 chest radiograph, 01/18/2009 chest CT FINDINGS: Mediastinum/Nodes: Thoracic inlet normal. Thyroid normal. No pericardial effusion. Thoracic aorta shows no dissection or dilatation. There are no filling defects in the pulmonary arterial system. There is mild coronary artery calcification. There are scattered mediastinal lymph nodes which are likely reactive. Lungs/Pleura: Patchy multifocal ground-glass infiltrate right upper lobe and in the lingula. Lungs are clear. No pleural effusions. Upper abdomen: No acute findings Musculoskeletal: No acute findings Review of the MIP images confirms the above findings. IMPRESSION: Findings suggest bilateral atypical pneumonia. Suggest follow-up imaging after appropriate therapy to ensure resolution. Electronically Signed   By: Skipper Cliche M.D.   On: 11/09/2015 18:40   I have personally reviewed and evaluated these images and lab results as part of my medical decision-making.   EKG Interpretation   Date/Time:  Saturday November 09 2015 12:41:56 EST Ventricular Rate:  91 PR Interval:  143 QRS Duration: 90 QT Interval:  411 QTC Calculation: 506 R Axis:   -53 Text Interpretation:  Sinus rhythm LAD, consider left anterior fascicular  block Abnormal R-wave progression, early transition Prolonged QT interval  Since last tracing QT has lengthened Confirmed by Eulis Foster  MD, Vira Agar  [id number]) on 11/09/2015 4:52:38 PM      MDM   Final diagnoses:  CAP (community acquired pneumonia)      Community acquired pneumonia with some atypical features. She has continued shortness of breath with normal oxygenation. Vital signs improved after IV fluid hydration. Doubt serous bacterial infection, sepsis, metabolic instability or impending vascular collapse.   Nursing Notes Reviewed/ Care Coordinated Applicable Imaging  Reviewed Interpretation of Laboratory Data incorporated into ED treatment  The patient appears reasonably screened and/or stabilized for discharge and I doubt any other medical condition or other [facility] requiring further screening, evaluation, or treatment in the ED at this time prior to discharge.  Plan: Home Medications- Levaquin; Home Treatments- rest; return here if the recommended treatment, does not improve the symptoms; Recommended follow up- PCP f/u in 2 -3 days  I personally performed the services described in this documentation, which was scribed in my presence. The recorded information has been reviewed and is accurate.      Daleen Bo, MD 11/09/15 1949

## 2015-11-09 NOTE — ED Notes (Signed)
Patient verbalizes understanding of discharge instructions, prescription medications, home care and follow up care. Patient out of department at this time with family. 

## 2015-11-13 ENCOUNTER — Observation Stay (HOSPITAL_COMMUNITY)
Admission: EM | Admit: 2015-11-13 | Discharge: 2015-11-14 | Disposition: A | Discharge status: Home / Self Care | Payer: 59 | Attending: Emergency Medicine | Admitting: Emergency Medicine

## 2015-11-13 ENCOUNTER — Encounter (HOSPITAL_COMMUNITY): Payer: Self-pay | Admitting: Emergency Medicine

## 2015-11-13 ENCOUNTER — Emergency Department (HOSPITAL_COMMUNITY): Payer: 59

## 2015-11-13 DIAGNOSIS — K219 Gastro-esophageal reflux disease without esophagitis: Secondary | ICD-10-CM | POA: Insufficient documentation

## 2015-11-13 DIAGNOSIS — M797 Fibromyalgia: Secondary | ICD-10-CM | POA: Insufficient documentation

## 2015-11-13 DIAGNOSIS — R064 Hyperventilation: Secondary | ICD-10-CM | POA: Diagnosis not present

## 2015-11-13 DIAGNOSIS — R531 Weakness: Secondary | ICD-10-CM | POA: Insufficient documentation

## 2015-11-13 DIAGNOSIS — Z9049 Acquired absence of other specified parts of digestive tract: Secondary | ICD-10-CM | POA: Diagnosis not present

## 2015-11-13 DIAGNOSIS — R05 Cough: Secondary | ICD-10-CM | POA: Diagnosis not present

## 2015-11-13 DIAGNOSIS — R0602 Shortness of breath: Secondary | ICD-10-CM | POA: Insufficient documentation

## 2015-11-13 DIAGNOSIS — R079 Chest pain, unspecified: Secondary | ICD-10-CM | POA: Insufficient documentation

## 2015-11-13 DIAGNOSIS — Z79899 Other long term (current) drug therapy: Secondary | ICD-10-CM | POA: Diagnosis not present

## 2015-11-13 DIAGNOSIS — M1991 Primary osteoarthritis, unspecified site: Secondary | ICD-10-CM | POA: Diagnosis not present

## 2015-11-13 DIAGNOSIS — J441 Chronic obstructive pulmonary disease with (acute) exacerbation: Secondary | ICD-10-CM | POA: Diagnosis present

## 2015-11-13 DIAGNOSIS — J189 Pneumonia, unspecified organism: Secondary | ICD-10-CM | POA: Diagnosis present

## 2015-11-13 DIAGNOSIS — Z880 Allergy status to penicillin: Secondary | ICD-10-CM | POA: Diagnosis not present

## 2015-11-13 DIAGNOSIS — R110 Nausea: Secondary | ICD-10-CM | POA: Insufficient documentation

## 2015-11-13 LAB — CBC WITH DIFFERENTIAL/PLATELET
Basophils Absolute: 0 10*3/uL (ref 0.0–0.1)
Basophils Relative: 0 %
EOS ABS: 0.1 10*3/uL (ref 0.0–0.7)
Eosinophils Relative: 1 %
HEMATOCRIT: 35.4 % — AB (ref 36.0–46.0)
HEMOGLOBIN: 12.4 g/dL (ref 12.0–15.0)
LYMPHS ABS: 3 10*3/uL (ref 0.7–4.0)
Lymphocytes Relative: 24 %
MCH: 30.8 pg (ref 26.0–34.0)
MCHC: 35 g/dL (ref 30.0–36.0)
MCV: 87.8 fL (ref 78.0–100.0)
MONO ABS: 0.8 10*3/uL (ref 0.1–1.0)
MONOS PCT: 6 %
NEUTROS ABS: 8.7 10*3/uL — AB (ref 1.7–7.7)
NEUTROS PCT: 69 %
Platelets: 330 10*3/uL (ref 150–400)
RBC: 4.03 MIL/uL (ref 3.87–5.11)
RDW: 12.3 % (ref 11.5–15.5)
WBC: 12.6 10*3/uL — ABNORMAL HIGH (ref 4.0–10.5)

## 2015-11-13 LAB — BLOOD GAS, ARTERIAL
ACID-BASE EXCESS: 1.3 mmol/L (ref 0.0–2.0)
Bicarbonate: 24.3 mEq/L — ABNORMAL HIGH (ref 20.0–24.0)
Drawn by: 22223
FIO2: 21
O2 Saturation: 97.2 %
PCO2 ART: 26 mmHg — AB (ref 35.0–45.0)
PH ART: 7.523 — AB (ref 7.350–7.450)
PO2 ART: 91.1 mmHg (ref 80.0–100.0)
TCO2: 16.6 mmol/L (ref 0–100)

## 2015-11-13 LAB — TROPONIN I: Troponin I: <0.03 ng/mL (ref <0.031)

## 2015-11-13 LAB — BASIC METABOLIC PANEL
Anion gap: 11 (ref 5–15)
BUN: 18 mg/dL (ref 6–20)
CALCIUM: 9.3 mg/dL (ref 8.9–10.3)
CHLORIDE: 105 mmol/L (ref 101–111)
CO2: 22 mmol/L (ref 22–32)
CREATININE: 0.81 mg/dL (ref 0.44–1.00)
GFR calc Af Amer: >60 mL/min (ref >60)
GFR calc non Af Amer: >60 mL/min (ref >60)
Glucose, Bld: 120 mg/dL — ABNORMAL HIGH (ref 65–99)
Potassium: 3.1 mmol/L — ABNORMAL LOW (ref 3.5–5.1)
Sodium: 138 mmol/L (ref 135–145)

## 2015-11-13 MED ORDER — ALBUTEROL SULFATE (2.5 MG/3ML) 0.083% IN NEBU
5.0000 mg | INHALATION_SOLUTION | Freq: Once | RESPIRATORY_TRACT | Status: AC
  Administered 2015-11-13: 5 mg via RESPIRATORY_TRACT
  Filled 2015-11-13: qty 6

## 2015-11-13 MED ORDER — SODIUM CHLORIDE 0.9 % IV SOLN
INTRAVENOUS | Status: AC
  Administered 2015-11-13: 75 mL/h via INTRAVENOUS
  Administered 2015-11-13: 21:00:00 via INTRAVENOUS

## 2015-11-13 MED ORDER — LORAZEPAM 1 MG PO TABS
1.0000 mg | ORAL_TABLET | Freq: Once | ORAL | Status: AC
  Administered 2015-11-13: 1 mg via ORAL
  Filled 2015-11-13: qty 1

## 2015-11-13 MED ORDER — ACETAMINOPHEN 325 MG PO TABS
650.0000 mg | ORAL_TABLET | Freq: Once | ORAL | Status: AC
  Administered 2015-11-13: 650 mg via ORAL
  Filled 2015-11-13: qty 2

## 2015-11-13 MED ORDER — POTASSIUM CHLORIDE CRYS ER 20 MEQ PO TBCR
40.0000 meq | EXTENDED_RELEASE_TABLET | Freq: Once | ORAL | Status: AC
  Administered 2015-11-13: 40 meq via ORAL
  Filled 2015-11-13: qty 2

## 2015-11-13 MED ORDER — BENZONATATE 100 MG PO CAPS
200.0000 mg | ORAL_CAPSULE | Freq: Once | ORAL | Status: AC
  Administered 2015-11-13: 200 mg via ORAL
  Filled 2015-11-13: qty 2

## 2015-11-13 MED ORDER — IPRATROPIUM BROMIDE 0.02 % IN SOLN
0.5000 mg | Freq: Once | RESPIRATORY_TRACT | Status: AC
  Administered 2015-11-13: 0.5 mg via RESPIRATORY_TRACT
  Filled 2015-11-13: qty 2.5

## 2015-11-13 MED ORDER — GUAIFENESIN-DM 100-10 MG/5ML PO SYRP
5.0000 mL | ORAL_SOLUTION | Freq: Once | ORAL | Status: DC
  Filled 2015-11-13: qty 5

## 2015-11-13 MED ORDER — PREDNISONE 50 MG PO TABS
60.0000 mg | ORAL_TABLET | Freq: Once | ORAL | Status: AC
  Administered 2015-11-13: 60 mg via ORAL
  Filled 2015-11-13: qty 1

## 2015-11-13 NOTE — ED Notes (Signed)
Patient states "I was here last Saturday and told I had double pneumonia. They wanted to admit me, but I didn't want to stay." States she went to Dr Marland Kitchen today and was sent here for admission.

## 2015-11-13 NOTE — ED Provider Notes (Signed)
CSN: [id number]     Arrival date & time 11/13/15  1744 History  By signing my name below, I, Doran Stabler, attest that this documentation has been prepared under the direction and in the presence of Daleen Bo, MD. Electronically Signed: Doran Stabler, ED Scribe. 11/13/2015. 8:26 PM.    Chief Complaint  Patient presents with  . Pneumonia   The history is provided by the patient. No language interpreter was used.   HPI Comments: Sarah [name] is a 65 y.o. female with PMHx of GERD, fibromyalgia, and arthritis, who presents to the Emergency Department complaining of a persistent productive cough with associated chest pain radiating to her her back and abdomen for the past 4 days. She states her cough is worse at night. Pt also reports loss of appetite, generalized weakness, nausea, and subjective fever. Pt has not tried a nebulizer, inhaler, or any at home breathing treatments for relief. She took a Percocet for pain this morning. Pt states that she was seen by Dr. Delanna Ahmadi office who referred her to the ED for further evaluation. Pt has not seen a "lung doctor" in the past. Pt denies any chills, vomiting, dizziness or any other sx at this time. Pt is compliant with Levaquin.   Pt was seen on 11/09/15 and had a CXR, CT chest and blood work. She was diagnosed with PNA.   Past Medical History  Diagnosis Date  . Neck pain, chronic   . Reflux   . Fibromyalgia   . Arthritis    Past Surgical History  Procedure Laterality Date  . Abdominal hysterectomy    . Cholecystectomy    . Breast cyst excision     Family History  Problem Relation Age of Onset  . Heart disease Mother   . Cancer Maternal Aunt   . Diabetes Maternal Grandmother   . Heart disease Maternal Grandmother    Social History  Substance Use Topics  . Smoking status: Never Smoker   . Smokeless tobacco: Never Used  . Alcohol Use: No   OB History    No data available     Review of Systems  Constitutional: Positive for  fever and appetite change (decrease). Negative for chills.  Respiratory: Positive for cough.   Cardiovascular: Positive for chest pain.  Gastrointestinal: Positive for nausea. Negative for vomiting.  Endocrine: Positive for cold intolerance.  Neurological: Positive for weakness. Negative for dizziness.  All other systems reviewed and are negative.   Allergies  Penicillins  Home Medications   Prior to Admission medications   Medication Sig Start Date End Date Taking? Authorizing Provider  esomeprazole (NEXIUM) 40 MG capsule Take 40 mg by mouth daily.    Yes Historical Provider, MD  estradiol (ESTRACE) 1 MG tablet TAKE 1 TABLET (1 MG TOTAL) BY MOUTH DAILY. 08/03/15  Yes Jonnie Kind, MD  levofloxacin (LEVAQUIN) 750 MG tablet Take 1 tablet (750 mg total) by mouth daily. X 7 days 11/09/15  Yes Daleen Bo, MD  Misc Natural Products (HOT FLASHEX PO) Take 1 tablet by mouth daily.    Yes Historical Provider, MD  oxyCODONE-acetaminophen (PERCOCET/ROXICET) 5-325 MG tablet Take 1 tablet by mouth every 4 (four) hours as needed for moderate pain.   Yes Historical Provider, MD  acetaminophen (TYLENOL) 650 MG CR tablet Take 650 mg by mouth every 8 (eight) hours as needed for pain.    Historical Provider, MD  diazepam (VALIUM) 5 MG tablet Take 5 mg by mouth every 6 (six) hours as needed for  anxiety.    Historical Provider, MD  Incontinence Supplies (ANTI-REFLUX VALVE) MISC by Does not apply route.    Historical Provider, MD   BP 147/101 mmHg  Pulse 106  Temp(Src) 97.8 F (36.6 C) (Oral)  Resp 32  Ht 5\' 2"  (1.575 m)  Wt 165 lb (74.844 kg)  BMI 30.17 kg/m2  SpO2 99%   Physical Exam  Constitutional: She is oriented to person, place, and time. She appears well-developed and well-nourished.  HENT:  Head: Normocephalic and atraumatic.  Eyes: Conjunctivae and EOM are normal. Pupils are equal, round, and reactive to light.  Neck: Normal range of motion and phonation normal. Neck supple.   Cardiovascular: Regular rhythm.  Tachycardia present.   No murmur heard. Pulmonary/Chest: Effort normal and breath sounds normal. Tachypnea noted. She has no decreased breath sounds. She has no wheezes. She has no rhonchi. She has no rales. She exhibits tenderness.  Abdominal: Soft. She exhibits no distension. There is no tenderness. There is no guarding.  Musculoskeletal: Normal range of motion.  Diffuse back tenderness  Neurological: She is alert and oriented to person, place, and time. She exhibits normal muscle tone.  Skin: Skin is warm and dry.  Psychiatric: She has a normal mood and affect. Her behavior is normal. Judgment and thought content normal.  Nursing note and vitals reviewed.   ED Course  Procedures   DIAGNOSTIC STUDIES: Oxygen Saturation is 99% on room air, normal by my interpretation.    COORDINATION OF CARE: 8:06 PM Will order CXR, blood work and EKG. Will give breathing treatment.  Discussed treatment plan with pt at bedside and pt agreed to plan.  Medications  albuterol (PROVENTIL HFA;VENTOLIN HFA) 108 (90 Base) MCG/ACT inhaler 2 puff (not administered)  aerochamber Z-Stat Plus/medium 1 each (not administered)  0.9 %  sodium chloride infusion ( Intravenous New Bag/Given 11/13/15 2053)  albuterol (PROVENTIL) (2.5 MG/3ML) 0.083% nebulizer solution 5 mg (5 mg Nebulization Given 11/13/15 2040)  ipratropium (ATROVENT) nebulizer solution 0.5 mg (0.5 mg Nebulization Given 11/13/15 2040)  potassium chloride SA (K-DUR,KLOR-CON) CR tablet 40 mEq (40 mEq Oral Given 11/13/15 2054)  LORazepam (ATIVAN) tablet 1 mg (1 mg Oral Given 11/13/15 2055)  predniSONE (DELTASONE) tablet 60 mg (60 mg Oral Given 11/13/15 2056)  benzonatate (TESSALON) capsule 200 mg (200 mg Oral Given 11/13/15 2123)  acetaminophen (TYLENOL) tablet 650 mg (650 mg Oral Given 11/13/15 2236)    Patient Vitals for the past 24 hrs:  BP Temp Temp src Pulse Resp SpO2 Height Weight  11/13/15 2257 144/94 mmHg - - 108 26 94 % - -   11/13/15 2040 - - - - - 97 % - -  11/13/15 1822 (!) 147/101 mmHg 97.8 F (36.6 C) Oral 106 (!) 32 99 % 5\' 2"  (1.575 m) 165 lb (74.844 kg)  11/13/15 1821 - - - - - 99 % - -    12:14 AM Reevaluation with update and discussion. After initial assessment and treatment, an updated evaluation reveals she is more comfortable now, her voice has improved and she has decreased respiratory rate. Oxygen saturation normal on room air. Findings discussed with patient and mother, all questions were answered. [place] Review Labs Reviewed  CBC WITH DIFFERENTIAL/PLATELET - Abnormal; Notable for the following:    WBC 12.6 (*)    HCT 35.4 (*)    Neutro Abs 8.7 (*)    All other components within normal limits  BASIC METABOLIC PANEL - Abnormal; Notable for the following:  Potassium 3.1 (*)    Glucose, Bld 120 (*)    All other components within normal limits  BLOOD GAS, ARTERIAL - Abnormal; Notable for the following:    pH, Arterial 7.523 (*)    pCO2 arterial 26.0 (*)    Bicarbonate 24.3 (*)    All other components within normal limits  TROPONIN I    Imaging Review Dg Chest 2 View  11/13/2015  CLINICAL DATA:  Shortness of breath, productive cough EXAM: CHEST  2 VIEW COMPARISON:  CT chest 11/09/2015 FINDINGS: The heart size and mediastinal contours are within normal limits. Both lungs are clear. The visualized skeletal structures are unremarkable. IMPRESSION: No active cardiopulmonary disease. Electronically Signed   By: Kathreen Devoid   On: 11/13/2015 19:04   I have personally reviewed and evaluated these images and lab results as part of my medical decision-making.  EKG Interpretation  Date/Time:  Wednesday November 13 2015 20:03:56 EST Ventricular Rate:  91 PR Interval:  157 QRS Duration: 93 QT Interval:  375 QTC Calculation: 461 R Axis:   -67 Text Interpretation:  Sinus rhythm Left anterior fascicular block Low  voltage, precordial leads LVH by voltage Anterior Q waves,  possibly due to  LVH since last tracing no significant change Confirmed by Eulis Foster  MD,   [id number]) on 11/13/2015 8:53:29 PM      MDM   Final diagnoses:  Hyperventilation    Patient with ongoing hyperventilation, with normal chest x-ray, indicating resolved pneumonia. Doubt PE, ACS or persistent acute respiratory infection. I suspect component of bronchospasm and anxiety contribute to her discomfort.  Nursing Notes Reviewed/ Care Coordinated Applicable Imaging Reviewed Interpretation of Laboratory Data incorporated into ED treatment  The patient appears reasonably screened and/or stabilized for discharge and I doubt any other medical condition or other [facility] requiring further screening, evaluation, or treatment in the ED at this time prior to discharge.  Plan: Home Medications- prednisone, Ativan, albuterol with spacer; Home Treatments- rest and drink plenty of fluids; return here if the recommended treatment, does not improve the symptoms; Recommended follow up- PCP Follow-up one week.   I personally performed the services described in this documentation, which was scribed in my presence. The recorded information has been reviewed and is accurate.    Daleen Bo, MD 11/14/15 [phone]

## 2015-11-14 MED ORDER — PREDNISONE 20 MG PO TABS: 60.0000 mg | ORAL_TABLET | Freq: Two times a day (BID) | ORAL | Status: DC

## 2015-11-14 MED ORDER — AEROCHAMBER Z-STAT PLUS/MEDIUM MISC: 1.0000 | Freq: Once | Status: DC

## 2015-11-14 MED ORDER — POTASSIUM CHLORIDE CRYS ER 20 MEQ PO TBCR: 40.0000 meq | EXTENDED_RELEASE_TABLET | Freq: Two times a day (BID) | ORAL | Status: DC

## 2015-11-14 MED ORDER — ALBUTEROL SULFATE HFA 108 (90 BASE) MCG/ACT IN AERS
2.0000 | INHALATION_SPRAY | RESPIRATORY_TRACT | Status: DC | PRN
  Filled 2015-11-14: qty 6.7

## 2015-11-14 MED ORDER — LORAZEPAM 1 MG PO TABS: 1.0000 mg | ORAL_TABLET | Freq: Four times a day (QID) | ORAL | Status: DC | PRN

## 2015-11-14 NOTE — Discharge Instructions (Signed)
Get plenty of rest, and drink a lot of fluids. Using inhaler 2 puffs every 3-4 hours as needed for cough or trouble breathing. Use a cough medicine such as Robitussin-DM as needed.    Hyperventilation Hyperventilation is breathing that is deeper and more rapid than normal. It is usually associated with panic and anxiety. Hyperventilation can make you feel breathless. It is sometimes called overbreathing. Breathing out too much causes a decrease in the amount of carbon dioxide gas in the blood. This leads to tingling and numbness in the hands, feet, and around the mouth. If this continues, your fingers, hands, and toes may begin to spasm. Hyperventilation usually lasts 20-30 minutes and can be associated with other symptoms of panic and anxiety, including:   Chest pains or tightness.  A pounding or irregular, racing heartbeat (palpitations).  Dizziness.  Lightheadedness.  Dry mouth.  Weakness.  Confusion.  Sleep disturbance. CAUSES  Sudden onset (acute) hyperventilation is usually triggered by acute stress, anxiety, or emotional upset. Long-term (chronic) and recurring hyperventilation can occur with chronic lung problems, such emphysema or asthma. Other causes include:   Nervousness.  Stress.  Stimulant, drug, or alcohol use.  Lung disease.  Infections, such as pneumonia.  Heart problems.  Severe pain.  Waking from a bad dream.  Pregnancy.  Bleeding. HOME CARE INSTRUCTIONS  Learn and use breathing exercises that help you breathe from your diaphragm and abdomen.  Practice relaxation techniques to reduce stress, such as visualization, meditation, and muscle release.  During an attack, try breathing into a paper bag. This changes the carbon dioxide level and slows down breathing. SEEK IMMEDIATE MEDICAL CARE IF:  Your hyperventilation continues or gets worse. MAKE SURE YOU:  Understand these instructions.  Will watch your condition.  Will get help right away  if you are not doing well or get worse.   This information is not intended to replace advice given to you by your health care provider. Make sure you discuss any questions you have with your health care provider.   Document Released: 09/18/2000 Document Revised: 03/22/2012 Document Reviewed: 12/31/2011 Elsevier Interactive Patient Education [employer].

## 2015-11-15 ENCOUNTER — Encounter: Payer: 59 | Admitting: Obstetrics and Gynecology

## 2015-11-22 ENCOUNTER — Encounter: Payer: 59 | Admitting: Obstetrics and Gynecology

## 2016-01-30 ENCOUNTER — Emergency Department (HOSPITAL_COMMUNITY): Payer: 59

## 2016-01-30 ENCOUNTER — Encounter (HOSPITAL_COMMUNITY): Payer: Self-pay | Admitting: Emergency Medicine

## 2016-01-30 ENCOUNTER — Inpatient Hospital Stay (HOSPITAL_COMMUNITY): Payer: 59

## 2016-01-30 ENCOUNTER — Observation Stay (HOSPITAL_COMMUNITY)
Admission: EM | Admit: 2016-01-30 | Discharge: 2016-01-31 | Disposition: A | Discharge status: Home / Self Care | Payer: 59 | Attending: Internal Medicine | Admitting: Internal Medicine

## 2016-01-30 DIAGNOSIS — I10 Essential (primary) hypertension: Secondary | ICD-10-CM | POA: Insufficient documentation

## 2016-01-30 DIAGNOSIS — R51 Headache: Secondary | ICD-10-CM | POA: Insufficient documentation

## 2016-01-30 DIAGNOSIS — I639 Cerebral infarction, unspecified: Secondary | ICD-10-CM | POA: Diagnosis not present

## 2016-01-30 DIAGNOSIS — E785 Hyperlipidemia, unspecified: Secondary | ICD-10-CM | POA: Insufficient documentation

## 2016-01-30 DIAGNOSIS — R519 Headache, unspecified: Secondary | ICD-10-CM | POA: Insufficient documentation

## 2016-01-30 DIAGNOSIS — I4581 Long QT syndrome: Secondary | ICD-10-CM | POA: Insufficient documentation

## 2016-01-30 DIAGNOSIS — R4781 Slurred speech: Secondary | ICD-10-CM | POA: Insufficient documentation

## 2016-01-30 DIAGNOSIS — R4701 Aphasia: Secondary | ICD-10-CM | POA: Diagnosis not present

## 2016-01-30 DIAGNOSIS — F985 Adult onset fluency disorder: Secondary | ICD-10-CM | POA: Insufficient documentation

## 2016-01-30 DIAGNOSIS — R471 Dysarthria and anarthria: Secondary | ICD-10-CM | POA: Diagnosis not present

## 2016-01-30 DIAGNOSIS — R42 Dizziness and giddiness: Secondary | ICD-10-CM | POA: Diagnosis not present

## 2016-01-30 DIAGNOSIS — K219 Gastro-esophageal reflux disease without esophagitis: Secondary | ICD-10-CM | POA: Insufficient documentation

## 2016-01-30 DIAGNOSIS — I672 Cerebral atherosclerosis: Secondary | ICD-10-CM | POA: Insufficient documentation

## 2016-01-30 DIAGNOSIS — R2990 Unspecified symptoms and signs involving the nervous system: Secondary | ICD-10-CM

## 2016-01-30 DIAGNOSIS — M797 Fibromyalgia: Secondary | ICD-10-CM | POA: Insufficient documentation

## 2016-01-30 DIAGNOSIS — Z880 Allergy status to penicillin: Secondary | ICD-10-CM | POA: Insufficient documentation

## 2016-01-30 DIAGNOSIS — M542 Cervicalgia: Secondary | ICD-10-CM | POA: Insufficient documentation

## 2016-01-30 DIAGNOSIS — I6522 Occlusion and stenosis of left carotid artery: Secondary | ICD-10-CM | POA: Insufficient documentation

## 2016-01-30 DIAGNOSIS — F54 Psychological and behavioral factors associated with disorders or diseases classified elsewhere: Secondary | ICD-10-CM | POA: Diagnosis present

## 2016-01-30 DIAGNOSIS — R55 Syncope and collapse: Secondary | ICD-10-CM | POA: Insufficient documentation

## 2016-01-30 DIAGNOSIS — H538 Other visual disturbances: Secondary | ICD-10-CM

## 2016-01-30 DIAGNOSIS — M1990 Unspecified osteoarthritis, unspecified site: Secondary | ICD-10-CM | POA: Insufficient documentation

## 2016-01-30 HISTORY — DX: Psychological and behavioral factors associated with disorders or diseases classified elsewhere: F54

## 2016-01-30 LAB — CBC
HCT: 38.5 % (ref 36.0–46.0)
Hemoglobin: 13.3 g/dL (ref 12.0–15.0)
MCH: 30.6 pg (ref 26.0–34.0)
MCHC: 34.5 g/dL (ref 30.0–36.0)
MCV: 88.5 fL (ref 78.0–100.0)
Platelets: 243 10*3/uL (ref 150–400)
RBC: 4.35 MIL/uL (ref 3.87–5.11)
RDW: 12.5 % (ref 11.5–15.5)
WBC: 10.6 10*3/uL — ABNORMAL HIGH (ref 4.0–10.5)

## 2016-01-30 LAB — URINE MICROSCOPIC-ADD ON

## 2016-01-30 LAB — RAPID URINE DRUG SCREEN, HOSP PERFORMED
Amphetamines: NOT DETECTED
Barbiturates: NOT DETECTED
Benzodiazepines: NOT DETECTED
Cocaine: NOT DETECTED
Opiates: NOT DETECTED
Tetrahydrocannabinol: NOT DETECTED

## 2016-01-30 LAB — DIFFERENTIAL
Basophils Absolute: 0 10*3/uL (ref 0.0–0.1)
Basophils Relative: 0 %
Eosinophils Absolute: 0.1 10*3/uL (ref 0.0–0.7)
Eosinophils Relative: 1 %
Lymphocytes Relative: 26 %
Lymphs Abs: 2.8 10*3/uL (ref 0.7–4.0)
Monocytes Absolute: 0.5 10*3/uL (ref 0.1–1.0)
Monocytes Relative: 4 %
Neutro Abs: 7.2 10*3/uL (ref 1.7–7.7)
Neutrophils Relative %: 69 %

## 2016-01-30 LAB — COMPREHENSIVE METABOLIC PANEL
ALT: 18 U/L (ref 14–54)
AST: 21 U/L (ref 15–41)
Albumin: 4.4 g/dL (ref 3.5–5.0)
Alkaline Phosphatase: 57 U/L (ref 38–126)
Anion gap: 10 (ref 5–15)
BUN: 16 mg/dL (ref 6–20)
CO2: 21 mmol/L — ABNORMAL LOW (ref 22–32)
Calcium: 9.2 mg/dL (ref 8.9–10.3)
Chloride: 107 mmol/L (ref 101–111)
Creatinine, Ser: 0.78 mg/dL (ref 0.44–1.00)
GFR calc Af Amer: >60 mL/min (ref >60)
GFR calc non Af Amer: >60 mL/min (ref >60)
Glucose, Bld: 107 mg/dL — ABNORMAL HIGH (ref 65–99)
Potassium: 3.6 mmol/L (ref 3.5–5.1)
Sodium: 138 mmol/L (ref 135–145)
Total Bilirubin: 0.4 mg/dL (ref 0.3–1.2)
Total Protein: 7.8 g/dL (ref 6.5–8.1)

## 2016-01-30 LAB — I-STAT TROPONIN, ED: Troponin i, poc: 0 ng/mL (ref 0.00–0.08)

## 2016-01-30 LAB — I-STAT CHEM 8, ED
BUN: 17 mg/dL (ref 6–20)
Calcium, Ion: 1.11 mmol/L — ABNORMAL LOW (ref 1.13–1.30)
Chloride: 106 mmol/L (ref 101–111)
Creatinine, Ser: 0.6 mg/dL (ref 0.44–1.00)
Glucose, Bld: 105 mg/dL — ABNORMAL HIGH (ref 65–99)
HCT: 41 % (ref 36.0–46.0)
Hemoglobin: 13.9 g/dL (ref 12.0–15.0)
Potassium: 3.8 mmol/L (ref 3.5–5.1)
Sodium: 140 mmol/L (ref 135–145)
TCO2: 21 mmol/L (ref 0–100)

## 2016-01-30 LAB — URINALYSIS, ROUTINE W REFLEX MICROSCOPIC
Bilirubin Urine: NEGATIVE
Glucose, UA: NEGATIVE mg/dL
Ketones, ur: NEGATIVE mg/dL
Leukocytes, UA: NEGATIVE
Nitrite: NEGATIVE
Protein, ur: NEGATIVE mg/dL
Specific Gravity, Urine: 1.01 (ref 1.005–1.030)
pH: 5.5 (ref 5.0–8.0)

## 2016-01-30 LAB — PROTIME-INR
INR: 0.98 (ref 0.00–1.49)
Prothrombin Time: 13.2 seconds (ref 11.6–15.2)

## 2016-01-30 LAB — ETHANOL: Alcohol, Ethyl (B): 5 mg/dL — ABNORMAL HIGH (ref <5)

## 2016-01-30 LAB — APTT: aPTT: 26 seconds (ref 24–37)

## 2016-01-30 MED ORDER — ASPIRIN 300 MG RE SUPP: 300.0000 mg | Freq: Every day | RECTAL | Status: DC

## 2016-01-30 MED ORDER — SENNOSIDES-DOCUSATE SODIUM 8.6-50 MG PO TABS: 1.0000 | ORAL_TABLET | Freq: Every evening | ORAL | Status: DC | PRN

## 2016-01-30 MED ORDER — ACETAMINOPHEN 325 MG PO TABS
650.0000 mg | ORAL_TABLET | Freq: Four times a day (QID) | ORAL | Status: DC | PRN
  Administered 2016-01-30 – 2016-01-31 (×3): 650 mg via ORAL
  Filled 2016-01-30 (×3): qty 2

## 2016-01-30 MED ORDER — CLONIDINE HCL 0.1 MG PO TABS: 0.1000 mg | ORAL_TABLET | Freq: Three times a day (TID) | ORAL | Status: DC | PRN

## 2016-01-30 MED ORDER — ASPIRIN 325 MG PO TABS
325.0000 mg | ORAL_TABLET | Freq: Every day | ORAL | Status: DC
  Administered 2016-01-31: 325 mg via ORAL
  Filled 2016-01-30: qty 1

## 2016-01-30 MED ORDER — STROKE: EARLY STAGES OF RECOVERY BOOK
Freq: Once | Status: DC
  Filled 2016-01-30: qty 1

## 2016-01-30 MED ORDER — CETYLPYRIDINIUM CHLORIDE 0.05 % MT LIQD
7.0000 mL | Freq: Two times a day (BID) | OROMUCOSAL | Status: DC
  Administered 2016-01-30 – 2016-01-31 (×2): 7 mL via OROMUCOSAL

## 2016-01-30 MED ORDER — ASPIRIN 81 MG PO CHEW
324.0000 mg | CHEWABLE_TABLET | Freq: Once | ORAL | Status: AC
  Administered 2016-01-30: 324 mg via ORAL
  Filled 2016-01-30: qty 4

## 2016-01-30 MED ORDER — PANTOPRAZOLE SODIUM 40 MG PO TBEC
40.0000 mg | DELAYED_RELEASE_TABLET | Freq: Every day | ORAL | Status: DC
  Administered 2016-01-31: 40 mg via ORAL
  Filled 2016-01-30: qty 1

## 2016-01-30 MED ORDER — ENOXAPARIN SODIUM 40 MG/0.4ML ~~LOC~~ SOLN
40.0000 mg | SUBCUTANEOUS | Status: DC
  Administered 2016-01-30: 40 mg via SUBCUTANEOUS
  Filled 2016-01-30: qty 0.4

## 2016-01-30 MED ORDER — ESTRADIOL 1 MG PO TABS
1.0000 mg | ORAL_TABLET | Freq: Every day | ORAL | Status: DC
  Administered 2016-01-31: 1 mg via ORAL
  Filled 2016-01-30 (×4): qty 1

## 2016-01-30 NOTE — ED Notes (Signed)
Tele neurology consult inprogress

## 2016-01-30 NOTE — ED Notes (Addendum)
Dizzy,  Blurred vision since yesterday..  Today after lunch developed difficulties speaking. Also c/o headache. Dr. Wilson Singer at triage seeing pt.

## 2016-01-30 NOTE — H&P (Signed)
[facility] History and Physical  Sarah [name] [id number] DOB: 16-May-1951 DOA: 01/30/2016  Referring physician: Dr. Wilson Singer PCP: Purvis Kilts, MD   Chief Complaint:  Slurred speech, headache  HPI: Sarah [name] is a 65 y.o. female with hx of GERD, fibromyalgia and mild DJD presenting to ED today with complaints of headache, dizziness, blurred vision and speech difficulties. Patient says that yesterday at work she started having a headache then blurred vision and dizziness and "almost passed out".  She stopped at [employer] on her way home yest and took her BP, it was "high". She is not on any BP meds and no hx of HTN.  Today she still felt poor with HA's and dizziness. After lunch she develeped slurred speech and difficulty speaking, making an effort to get the words out. This persisted and she came to ED at 3:30 and code stroke was initiated w teleneurology.  Low NIHSS score so was not given TPA.  Denies any hx of prior TIA/ CVA or similar symptoms.  Head CT no acute changes.  Asked to see for admission.    Patient provides own history, her aunt is here with her.  No hx HTN or heart problems.  See's Dr Girtha Rm as PCP.  Takes hormone pill, nexium and tylenol prn.  Worked at [employer] , then [employer], and now works at [employer] at [employer].  Lives alone, widowed, one son age 26 lives in [place].  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  That helped prompt her to come to the hospital today.   ROS  denies CP  no joint pain   no HA  no rash  no diarrhea  no nausea/ vomiting  no dysuria  no difficulty voiding  no change in urine color    Where does patient live home Can patient participate in ADLs? yes  Past Medical History  Past Medical History  Diagnosis Date  . Neck pain, chronic   . Reflux   . Fibromyalgia   . Arthritis     Past Surgical History  Past Surgical History  Procedure Laterality Date  . Abdominal hysterectomy    . Cholecystectomy    . Breast cyst excision     Family History  Family History  Problem Relation Age of Onset  . Heart disease Mother   . Cancer Maternal Aunt   . Diabetes Maternal Grandmother   . Heart disease Maternal Grandmother    Social History  reports that she has never smoked. She has never used smokeless tobacco. She reports that she does not drink alcohol or use illicit drugs. Allergies  Allergies  Allergen Reactions  . Penicillins Itching    Has patient had a PCN reaction causing immediate rash, facial/tongue/throat swelling, SOB or lightheadedness with hypotension: Yes Has patient had a PCN reaction causing severe rash involving mucus membranes or skin necrosis: No Has patient had a PCN reaction that required hospitalization No Has patient had a PCN reaction occurring within the last 10 years: No If all of the above answers are "NO", then may proceed with Cephalosporin use.     Home medications Prior to Admission medications   Medication Sig Start Date End Date Taking? Authorizing Provider  acetaminophen (TYLENOL) 650 MG CR tablet Take 650 mg by mouth every 8 (eight) hours as needed for pain.  Historical Provider, MD  diazepam (VALIUM) 5 MG tablet Take 5 mg by mouth every 6 (six) hours as needed for anxiety.    Historical Provider, MD  esomeprazole (NEXIUM) 40 MG capsule Take 40 mg by mouth daily.     Historical Provider, MD  estradiol (ESTRACE) 1 MG tablet TAKE 1 TABLET (1 MG TOTAL) BY MOUTH DAILY. 08/03/15   Jonnie Kind, MD  Incontinence Supplies (ANTI-REFLUX VALVE) MISC by Does not apply route.    Historical Provider, MD  levofloxacin (LEVAQUIN) 750 MG tablet Take 1 tablet (750 mg total) by mouth daily. X 7 days 11/09/15   Daleen Bo, MD  LORazepam (ATIVAN) 1 MG tablet Take 1 tablet (1 mg total) by mouth every 6 (six) hours as needed for anxiety. 11/14/15    Daleen Bo, MD  Misc Natural Products (HOT FLASHEX PO) Take 1 tablet by mouth daily.     Historical Provider, MD  oxyCODONE-acetaminophen (PERCOCET/ROXICET) 5-325 MG tablet Take 1 tablet by mouth every 4 (four) hours as needed for moderate pain.    Historical Provider, MD  potassium chloride SA (K-DUR,KLOR-CON) 20 MEQ tablet Take 2 tablets (40 mEq total) by mouth 2 (two) times daily. 11/14/15   Daleen Bo, MD  predniSONE (DELTASONE) 20 MG tablet Take 3 tablets (60 mg total) by mouth 2 (two) times daily with a meal. 11/14/15   Daleen Bo, MD   Liver Function Tests  Recent Labs Lab 01/30/16 1558  AST 21  ALT 18  ALKPHOS 57  BILITOT 0.4  PROT 7.8  ALBUMIN 4.4   No results for input(s): LIPASE, AMYLASE in the last 168 hours. CBC  Recent Labs Lab 01/30/16 1558 01/30/16 1606  WBC 10.6*  --   NEUTROABS 7.2  --   HGB 13.3 13.9  HCT 38.5 41.0  MCV 88.5  --   PLT 243  --    Basic Metabolic Panel  Recent Labs Lab 01/30/16 1558 01/30/16 1606  NA 138 140  K 3.6 3.8  CL 107 106  CO2 21*  --   GLUCOSE 107* 105*  BUN 16 17  CREATININE 0.78 0.60  CALCIUM 9.2  --      Filed Vitals:   01/30/16 1627 01/30/16 1628 01/30/16 1630 01/30/16 1645  BP:  140/96 149/84 151/84  Pulse:  87 81 74  Temp: 97.9 F (36.6 C)     Resp:  19 16 21   SpO2:  98% 99% 98%   Exam: VS: BP 170/100 to 150/90 Gen alert no distress No rash, cyanosis or gangrene Sclera anicteric, throat clear No jvd or bruits Chest clear bilat RRR no MRG Abd soft ntnd no mass or ascites +bs mod obese GU deferred MS no joint effusions or deformity Ext no LE edema / no wounds or ulcers Neuro: NIHSS 1a LOC - Alert / 0 1b LOC questions - Ox 3 / 0 1c LOC commands - grips and close eyes bilat to command / 0 2 Best Gaze - normal gaze / 0 3 Visual - visual fields intact / 0 4 Facial palsy - no facial palsy / 0 5a Motor L arm - no drift / 0 5b Motor R arm - no drift / 0 6a Motor L leg - no drift / 0 6b Motor  R leg - no drift / 0 7 Limb Ataxia - no ataxia x 4 limbs / 0 8 Sensory - no sensory loss face, arm, leg / 0 9 Best language - mild-mod aphasia / 1 10 Dysarthria -  mild-mod dysarthria / 1 11 Extinction/ Inattention - no inattention / 0 10) No sensory loss face/ arm / legs - 0  Total score = 2 (at 5:30 on 4/27)    EKG (independently reviewed) > NSR, LAFB, prolonged QT interval, PRWP CT head > chronic atrophy and ischemia, no acute   Home medications: nexiumn, estrace, percocet prn   Assessment: 1. Aphasia/ dysarthria - acute onset 24-36 hours, in conjunction w ^'d BP (new) , headache.  Head CT neg for bleed.  MRI not done yet.  Plan admit, stroke w/u with MRI/ MRA, carotid dopp neck, echo and telemetry.  Patient agrees and questions answered 2. HTN not on bp meds at home; use prn po clonidine, don't lower BP drastically w prob acute CVA 3. GERD  Plan - as above   DVT Prophylaxis lovenox  Code Status: full  Family Communication: aunt at bedside  Disposition Plan: when better    Sol Blazing [facility] Pager [phone]  Cell [phone]  If 7PM-7AM, please contact night-coverage [email or web address] Password [facility] 01/30/2016, 5:29 PM

## 2016-01-30 NOTE — ED Provider Notes (Signed)
CSN: [id number]     Arrival date & time 01/30/16  1532 History   First MD Initiated Contact with Patient 01/30/16 1545     Chief Complaint  Patient presents with  . Code Stroke     (Consider location/radiation/quality/duration/timing/severity/associated sxs/prior Treatment) HPI   15yF with HA, blurred vision and slurred speech. Symptom onset yesterday. Describes gradual onset headache which has intensified. Now "splitting." Constant. Worse with looking up. Binocular blurred vision. No acute neck pain. No fever or chills. Sometime after 12 noon today she developed slurred speech and some hesitancy. 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 Aunt is with her and reports that her speech is significantly different than her baseline. Aunt also reports that she appears "fidgety." She has headaches previously, but not commonly. Did have speech changes with a headache previously which resolved. No diagnosed history of migraine.    Past Medical History  Diagnosis Date  . Neck pain, chronic   . Reflux   . Fibromyalgia   . Arthritis    Past Surgical History  Procedure Laterality Date  . Abdominal hysterectomy    . Cholecystectomy    . Breast cyst excision     Family History  Problem Relation Age of Onset  . Heart disease Mother   . Cancer Maternal Aunt   . Diabetes Maternal Grandmother   . Heart disease Maternal Grandmother    Social History  Substance Use Topics  . Smoking status: Never Smoker   . Smokeless tobacco: Never Used  . Alcohol Use: No   OB History    No data available     Review of Systems  All systems reviewed and negative, other than as noted in HPI.   Allergies  Penicillins  Home Medications   Prior to Admission medications   Medication Sig Start Date End Date Taking? Authorizing Provider  acetaminophen (TYLENOL) 650 MG CR tablet Take 650 mg by mouth every 8 (eight) hours  as needed for pain.    Historical Provider, MD  diazepam (VALIUM) 5 MG tablet Take 5 mg by mouth every 6 (six) hours as needed for anxiety.    Historical Provider, MD  esomeprazole (NEXIUM) 40 MG capsule Take 40 mg by mouth daily.     Historical Provider, MD  estradiol (ESTRACE) 1 MG tablet TAKE 1 TABLET (1 MG TOTAL) BY MOUTH DAILY. 08/03/15   Jonnie Kind, MD  Incontinence Supplies (ANTI-REFLUX VALVE) MISC by Does not apply route.    Historical Provider, MD  levofloxacin (LEVAQUIN) 750 MG tablet Take 1 tablet (750 mg total) by mouth daily. X 7 days 11/09/15   Daleen Bo, MD  LORazepam (ATIVAN) 1 MG tablet Take 1 tablet (1 mg total) by mouth every 6 (six) hours as needed for anxiety. 11/14/15   Daleen Bo, MD  Misc Natural Products (HOT FLASHEX PO) Take 1 tablet by mouth daily.     Historical Provider, MD  oxyCODONE-acetaminophen (PERCOCET/ROXICET) 5-325 MG tablet Take 1 tablet by mouth every 4 (four) hours as needed for moderate pain.    Historical Provider, MD  potassium chloride SA (K-DUR,KLOR-CON) 20 MEQ tablet Take 2 tablets (40 mEq total) by mouth 2 (two) times daily. 11/14/15   Daleen Bo, MD  predniSONE (DELTASONE) 20 MG tablet Take 3 tablets (60 mg total) by mouth 2 (two) times daily with a meal. 11/14/15   Daleen Bo, MD   There were  no vitals taken for this visit. Physical Exam  Constitutional: She is oriented to person, place, and time. She appears well-developed and well-nourished. No distress.  HENT:  Head: Normocephalic and atraumatic.  Eyes: Conjunctivae are normal. Right eye exhibits no discharge. Left eye exhibits no discharge.  Neck: Neck supple.  Cardiovascular: Normal rate, regular rhythm and normal heart sounds.  Exam reveals no gallop and no friction rub.   No murmur heard. Pulmonary/Chest: Effort normal and breath sounds normal. No respiratory distress.  Abdominal: Soft. She exhibits no distension. There is no tenderness.  Musculoskeletal: She exhibits no edema  or tenderness.  Neurological: She is alert and oriented to person, place, and time. No cranial nerve deficit. She exhibits normal muscle tone. Coordination normal.  Speech is somewhat slurred and occasionally hesitant/stuttering. CN 2-12 intact. Strength 5/5 b/l u/l extremities. Sensation intact to light touch. Good finger to nose b/l. Did not ambulate but she got up from chair in triage and transferred to wheelchair unassisted.   Skin: Skin is warm and dry.  Psychiatric: She has a normal mood and affect. Her behavior is normal. Thought content normal.  Nursing note and vitals reviewed.   ED Course  Procedures (including critical care time) Labs Review Labs Reviewed  ETHANOL - Abnormal; Notable for the following:    Alcohol, Ethyl (B) 5 (*)    All other components within normal limits  CBC - Abnormal; Notable for the following:    WBC 10.6 (*)    All other components within normal limits  COMPREHENSIVE METABOLIC PANEL - Abnormal; Notable for the following:    CO2 21 (*)    Glucose, Bld 107 (*)    All other components within normal limits  URINALYSIS, ROUTINE W REFLEX MICROSCOPIC (NOT AT [facility]) - Abnormal; Notable for the following:    Hgb urine dipstick TRACE (*)    All other components within normal limits  URINE MICROSCOPIC-ADD ON - Abnormal; Notable for the following:    Squamous Epithelial / LPF 6-30 (*)    Bacteria, UA RARE (*)    All other components within normal limits  I-STAT CHEM 8, ED - Abnormal; Notable for the following:    Glucose, Bld 105 (*)    Calcium, Ion 1.11 (*)    All other components within normal limits  PROTIME-INR  APTT  DIFFERENTIAL  URINE RAPID DRUG SCREEN, HOSP PERFORMED  I-STAT TROPOININ, ED    Imaging Review Ct Head Code Stroke W/o Cm  01/30/2016  CLINICAL DATA:  Slurred speech for 1 day EXAM: CT HEAD WITHOUT CONTRAST TECHNIQUE: Contiguous axial images were obtained from the base of the skull through the vertex without intravenous contrast.  COMPARISON:  None. FINDINGS: Bony calvarium is intact. No findings to suggest acute hemorrhage, acute infarction or space-occupying mass lesion are noted. Scattered areas of decreased attenuation are noted with bowel my bilaterally as well as within the deep white matter consistent with chronic ischemic change. IMPRESSION: Chronic atrophic and ischemic changes without acute abnormality. These results were called by telephone at the time of interpretation on 01/30/2016 at 4:01 pm to Dr. Virgel Manifold , who verbally acknowledged these results. Electronically Signed   By: Inez Catalina M.D.   On: 01/30/2016 15:59   I have personally reviewed and evaluated these images and lab results as part of my medical decision-making.   EKG Interpretation   Date/Time:  Thursday January 30 2016 16:03:28 EDT Ventricular Rate:  84 PR Interval:  173 QRS Duration: 94 QT Interval:  416 QTC Calculation: 492 R Axis:   -61 Text Interpretation:  Sinus rhythm Left anterior fascicular block Low  voltage, precordial leads Abnormal R-wave progression, late transition  Borderline prolonged QT interval Confirmed by Wilson Singer  MD,  ([id number]) on  01/30/2016 4:14:48 PM      MDM   Final diagnoses:  Stroke-like symptoms  Essential hypertension  Nonintractable headache, unspecified chronicity pattern, unspecified headache type   65yF with HA, blurred vision and slurred speech. Voice sounds like a pronounced Binger drawl but it is also somewhat hesitant and aunt confirms that it is definite change from her baseline. Blurred vision actually started yesterday, but speech changes at 12 noon today. Code Stroke activated.   4:27 PM CT head w/o acute abnormality. Aspirin given. Declined pain meds. Pt was evaluated via telemedicine by neurology. No TPA secondary to very low NIH stroke score. Recommending admission for stroke work-up.     Virgel Manifold, MD 01/30/16 [phone]

## 2016-01-30 NOTE — ED Notes (Signed)
Pt ambulated to restroom with standby assist. States she feels "a little woozy". Gait slow with short steps-family member reports not baseline gait for patient.

## 2016-01-30 NOTE — ED Notes (Signed)
Pt c/o ha/dizziness and blurred vision since 1200 yesterday.  Pt reports slurred speech since around 1200 today (unsure of exact time).

## 2016-01-30 NOTE — ED Notes (Signed)
Pt ambulated to restroom & returned to room w/ no complications. 

## 2016-01-30 NOTE — ED Notes (Signed)
Patient Arrived 35, Paged Code Stroke 1542, Called [facility] 1545 and placed Camera in Room

## 2016-01-30 NOTE — Progress Notes (Signed)
1542 call from triage: code stroke 1545 patient and RN in [place] beeper code stroke room 2 1550 exam finished/ RN and patient going back to ER to room 2/ exam entered in EPIC by me as it wasn't put in by ER  1554 Images sent to [facility] 1556 Exam completed in [place] exam finalized in [place] exam called to ER physician by RAD

## 2016-01-30 NOTE — ED Notes (Signed)
Tele neurology consult complete. Pt is not a candidate for TPA due to onset of sx yesterday.

## 2016-01-31 ENCOUNTER — Inpatient Hospital Stay (HOSPITAL_BASED_OUTPATIENT_CLINIC_OR_DEPARTMENT_OTHER): Payer: 59

## 2016-01-31 ENCOUNTER — Encounter (HOSPITAL_COMMUNITY): Payer: Self-pay | Admitting: Internal Medicine

## 2016-01-31 ENCOUNTER — Inpatient Hospital Stay (HOSPITAL_COMMUNITY): Payer: 59

## 2016-01-31 DIAGNOSIS — I6350 Cerebral infarction due to unspecified occlusion or stenosis of unspecified cerebral artery: Secondary | ICD-10-CM | POA: Diagnosis not present

## 2016-01-31 DIAGNOSIS — H538 Other visual disturbances: Secondary | ICD-10-CM | POA: Diagnosis not present

## 2016-01-31 DIAGNOSIS — I639 Cerebral infarction, unspecified: Secondary | ICD-10-CM | POA: Diagnosis present

## 2016-01-31 DIAGNOSIS — R4701 Aphasia: Secondary | ICD-10-CM | POA: Insufficient documentation

## 2016-01-31 DIAGNOSIS — F54 Psychological and behavioral factors associated with disorders or diseases classified elsewhere: Secondary | ICD-10-CM | POA: Diagnosis not present

## 2016-01-31 DIAGNOSIS — E785 Hyperlipidemia, unspecified: Secondary | ICD-10-CM | POA: Diagnosis present

## 2016-01-31 DIAGNOSIS — I10 Essential (primary) hypertension: Secondary | ICD-10-CM | POA: Diagnosis not present

## 2016-01-31 HISTORY — DX: Psychological and behavioral factors associated with disorders or diseases classified elsewhere: F54

## 2016-01-31 LAB — T4, FREE: FREE T4: 0.84 ng/dL (ref 0.61–1.12)

## 2016-01-31 LAB — ECHOCARDIOGRAM COMPLETE
Height: 62 in
Weight: 2608 oz

## 2016-01-31 LAB — LIPID PANEL
CHOL/HDL RATIO: 4.9 ratio
CHOLESTEROL: 181 mg/dL (ref 0–200)
HDL: 37 mg/dL — AB (ref >40)
LDL CALC: 105 mg/dL — AB (ref 0–99)
TRIGLYCERIDES: 197 mg/dL — AB (ref <150)
VLDL: 39 mg/dL (ref 0–40)

## 2016-01-31 LAB — VITAMIN B12: Vitamin B-12: 1473 pg/mL — ABNORMAL HIGH (ref 180–914)

## 2016-01-31 LAB — TSH: TSH: 1.244 u[IU]/mL (ref 0.350–4.500)

## 2016-01-31 MED ORDER — ASPIRIN EC 81 MG PO TBEC: 81.0000 mg | DELAYED_RELEASE_TABLET | Freq: Every day | ORAL | Status: DC

## 2016-01-31 MED ORDER — IBUPROFEN 400 MG PO TABS: 400.0000 mg | ORAL_TABLET | ORAL | Status: DC | PRN

## 2016-01-31 MED ORDER — HYDROCHLOROTHIAZIDE 12.5 MG PO CAPS: 12.5000 mg | ORAL_CAPSULE | Freq: Every day | ORAL | Status: DC

## 2016-01-31 MED ORDER — POTASSIUM CHLORIDE ER 10 MEQ PO TBCR: 10.0000 meq | EXTENDED_RELEASE_TABLET | Freq: Every day | ORAL | Status: DC

## 2016-01-31 MED ORDER — ATORVASTATIN CALCIUM 10 MG PO TABS
10.0000 mg | ORAL_TABLET | Freq: Every day | ORAL | Status: DC
  Administered 2016-01-31: 10 mg via ORAL
  Filled 2016-01-31: qty 1

## 2016-01-31 NOTE — Progress Notes (Signed)
Patient with orders to be discharge home. Discharge instructions given, patient verbalized understanding. Prescriptions given. Patient stable. Patient left in private vehicle.  

## 2016-01-31 NOTE — Consult Note (Addendum)
Sarah Creek [name], MD     [email or web address]          Sarah [name] is an 65 y.o. female.   ASSESSMENT/PLAN: Multiple symptoms including headaches, dizziness, neck pain and presyncope likely due to elevated blood pressure.  Stuttering dysarthria which I in my experience is almost always due to psychosomatic disorders.  RECOMMENDATION:  Low-dose aspirin is reasonable in the short duration for about 3 months but likely not beneficial longer than this given that she has not had a true ischemic event.  Pressure control. Chlorothiazide seems reasonable low at a low dose. She should follow-up with her primary care provider to have her blood pressure checked.      The patient is a 65 year old white female who reports feeling dizzy while into her shift at work a couple days ago. She works at [employer]. She reports developing a neck pain, headaches and dysarthria. She does not report having focal numbness, weakness, chest pain or shortness of breath. She reports that she went home went to bed and woke up the following day. She was up and about when she developed similar complaints but this time around it was quite severe. She was seen at her primary care provider's office and was sent to the emergency room for further evaluation. She reports that dizziness was so severe the second time around that she thought she was going to pass out. She describes the dizziness as a lightheaded sensation. She does not report having spinning sensation. Again, she denies any focal numbness, weakness, chest pain or shortness of breath. She reports that she is not on there and any unusual psychosocial stresses although she does admit being to somewhat stressful about haven't works every Sunday at work. No new medications are reported. The review systems is otherwise negative.   GENERAL: Pleasant overweight female in no acute distress.  HEENT: Normal  ABDOMEN: soft  EXTREMITIES:  No edema   BACK: Normal  SKIN: She is noted to be well tanned. She temperature goes once a week or so as this helps with fibromyalgia.    MENTAL STATUS: Alert and oriented. Language and cognition are generally intact. Judgment and insight normal. She has a persistent stuttering/stammering type dysarthria.  CRANIAL NERVES: Pupils are equal, round and reactive to light and accomodation; extra ocular movements are full, there is no significant nystagmus; visual fields are full; upper and lower facial muscles are normal in strength and symmetric, there is no flattening of the nasolabial folds; tongue is midline; uvula is midline; shoulder elevation is normal.  MOTOR: Normal tone, bulk and strength; no pronator drift.  COORDINATION: Left finger to nose is normal, right finger to nose is normal, No rest tremor; no intention tremor; no postural tremor; no bradykinesia.  REFLEXES: Deep tendon reflexes are symmetrical and normal. Babinski reflexes are flexor bilaterally.   SENSATION: Normal to light touch, temperature, and pinprick.      Blood pressure 133/86, pulse 81, temperature 97.8 F (36.6 C), temperature source Oral, resp. rate 17, height 5' 2"  (1.575 m), weight 163 lb 5.8 oz (74.1 kg), SpO2 98 %.  Past Medical History  Diagnosis Date  . Neck pain, chronic   . Reflux   . Fibromyalgia   . Arthritis     Past Surgical History  Procedure Laterality Date  . Abdominal hysterectomy    . Cholecystectomy    . Breast cyst excision      Family History  Problem Relation Age of Onset  .  Heart disease Mother   . Cancer Maternal Aunt   . Diabetes Maternal Grandmother   . Heart disease Maternal Grandmother     Social History:  reports that she has never smoked. She has never used smokeless tobacco. She reports that she does not drink alcohol or use illicit drugs.  Allergies:  Allergies  Allergen Reactions  . Penicillins Itching    Has patient had a PCN reaction causing immediate  rash, facial/tongue/throat swelling, SOB or lightheadedness with hypotension: Yes Has patient had a PCN reaction causing severe rash involving mucus membranes or skin necrosis: No Has patient had a PCN reaction that required hospitalization No Has patient had a PCN reaction occurring within the last 10 years: No If all of the above answers are "NO", then may proceed with Cephalosporin use.      Medications: Prior to Admission medications   Medication Sig Start Date End Date Taking? Authorizing Provider  acetaminophen (TYLENOL) 650 MG CR tablet Take 650 mg by mouth every 8 (eight) hours as needed for pain.   Yes Historical Provider, MD  esomeprazole (NEXIUM) 40 MG capsule Take 40 mg by mouth daily.    Yes Historical Provider, MD  estradiol (ESTRACE) 1 MG tablet TAKE 1 TABLET (1 MG TOTAL) BY MOUTH DAILY. 08/03/15  Yes Jonnie Kind, MD  oxyCODONE-acetaminophen (PERCOCET/ROXICET) 5-325 MG tablet Take 1 tablet by mouth every 4 (four) hours as needed for moderate pain.   Yes Historical Provider, MD    Scheduled Meds: .  stroke: mapping our early stages of recovery book   Does not apply Once  . antiseptic oral rinse  7 mL Mouth Rinse BID  . aspirin  300 mg Rectal Daily   Or  . aspirin  325 mg Oral Daily  . atorvastatin  10 mg Oral q1800  . enoxaparin (LOVENOX) injection  40 mg Subcutaneous Q24H  . estradiol  1 mg Oral Daily  . pantoprazole  40 mg Oral Daily   Continuous Infusions:  PRN Meds:.acetaminophen, cloNIDine, ibuprofen, senna-docusate     Results for orders placed or performed during the hospital encounter of 01/30/16 (from the past 48 hour(s))  Ethanol     Status: Abnormal   Collection Time: 01/30/16  3:58 PM  Result Value Ref Range   Alcohol, Ethyl (B) 5 (H) <5 mg/dL    Comment:        LOWEST DETECTABLE LIMIT FOR SERUM ALCOHOL IS 5 mg/dL FOR MEDICAL PURPOSES ONLY   Protime-INR     Status: None   Collection Time: 01/30/16  3:58 PM  Result Value Ref Range    Prothrombin Time 13.2 11.6 - 15.2 seconds   INR 0.98 0.00 - 1.49  APTT     Status: None   Collection Time: 01/30/16  3:58 PM  Result Value Ref Range   aPTT 26 24 - 37 seconds  CBC     Status: Abnormal   Collection Time: 01/30/16  3:58 PM  Result Value Ref Range   WBC 10.6 (H) 4.0 - 10.5 K/uL   RBC 4.35 3.87 - 5.11 MIL/uL   Hemoglobin 13.3 12.0 - 15.0 g/dL   HCT 38.5 36.0 - 46.0 %   MCV 88.5 78.0 - 100.0 fL   MCH 30.6 26.0 - 34.0 pg   MCHC 34.5 30.0 - 36.0 g/dL   RDW 12.5 11.5 - 15.5 %   Platelets 243 150 - 400 K/uL  Differential     Status: None   Collection Time: 01/30/16  3:58 PM  Result Value Ref Range   Neutrophils Relative % 69 %   Neutro Abs 7.2 1.7 - 7.7 K/uL   Lymphocytes Relative 26 %   Lymphs Abs 2.8 0.7 - 4.0 K/uL   Monocytes Relative 4 %   Monocytes Absolute 0.5 0.1 - 1.0 K/uL   Eosinophils Relative 1 %   Eosinophils Absolute 0.1 0.0 - 0.7 K/uL   Basophils Relative 0 %   Basophils Absolute 0.0 0.0 - 0.1 K/uL  Comprehensive metabolic panel     Status: Abnormal   Collection Time: 01/30/16  3:58 PM  Result Value Ref Range   Sodium 138 135 - 145 mmol/L   Potassium 3.6 3.5 - 5.1 mmol/L   Chloride 107 101 - 111 mmol/L   CO2 21 (L) 22 - 32 mmol/L   Glucose, Bld 107 (H) 65 - 99 mg/dL   BUN 16 6 - 20 mg/dL   Creatinine, Ser 0.78 0.44 - 1.00 mg/dL   Calcium 9.2 8.9 - 10.3 mg/dL   Total Protein 7.8 6.5 - 8.1 g/dL   Albumin 4.4 3.5 - 5.0 g/dL   AST 21 15 - 41 U/L   ALT 18 14 - 54 U/L   Alkaline Phosphatase 57 38 - 126 U/L   Total Bilirubin 0.4 0.3 - 1.2 mg/dL   GFR calc non Af Amer >60 >60 mL/min   GFR calc Af Amer >60 >60 mL/min    Comment: (NOTE) The eGFR has been calculated using the CKD EPI equation. This calculation has not been validated in all clinical situations. eGFR's persistently <60 mL/min signify possible Chronic Kidney Disease.    Anion gap 10 5 - 15  Urine rapid drug screen (hosp performed)not at [facility]     Status: None   Collection Time:  01/30/16  4:00 PM  Result Value Ref Range   Opiates NONE DETECTED NONE DETECTED   Cocaine NONE DETECTED NONE DETECTED   Benzodiazepines NONE DETECTED NONE DETECTED   Amphetamines NONE DETECTED NONE DETECTED   Tetrahydrocannabinol NONE DETECTED NONE DETECTED   Barbiturates NONE DETECTED NONE DETECTED    Comment:        DRUG SCREEN FOR MEDICAL PURPOSES ONLY.  IF CONFIRMATION IS NEEDED FOR ANY PURPOSE, NOTIFY LAB WITHIN 5 DAYS.        LOWEST DETECTABLE LIMITS FOR URINE DRUG SCREEN Drug Class       Cutoff (ng/mL) Amphetamine      1000 Barbiturate      200 Benzodiazepine   542 Tricyclics       706 Opiates          300 Cocaine          300 THC              50   Urinalysis, Routine w reflex microscopic (not at [facility])     Status: Abnormal   Collection Time: 01/30/16  4:00 PM  Result Value Ref Range   Color, Urine YELLOW YELLOW   APPearance CLEAR CLEAR   Specific Gravity, Urine 1.010 1.005 - 1.030   pH 5.5 5.0 - 8.0   Glucose, UA NEGATIVE NEGATIVE mg/dL   Hgb urine dipstick TRACE (A) NEGATIVE   Bilirubin Urine NEGATIVE NEGATIVE   Ketones, ur NEGATIVE NEGATIVE mg/dL   Protein, ur NEGATIVE NEGATIVE mg/dL   Nitrite NEGATIVE NEGATIVE   Leukocytes, UA NEGATIVE NEGATIVE  Urine microscopic-add on     Status: Abnormal   Collection Time: 01/30/16  4:00 PM  Result Value Ref Range   Squamous  Epithelial / LPF 6-30 (A) NONE SEEN   WBC, UA 0-5 0 - 5 WBC/hpf   RBC / HPF 0-5 0 - 5 RBC/hpf   Bacteria, UA RARE (A) NONE SEEN  I-stat troponin, ED (not at [facility], [facility])     Status: None   Collection Time: 01/30/16  4:05 PM  Result Value Ref Range   Troponin i, poc 0.00 0.00 - 0.08 ng/mL   Comment 3            Comment: Due to the release kinetics of cTnI, a negative result within the first hours of the onset of symptoms does not rule out myocardial infarction with certainty. If myocardial infarction is still suspected, repeat the test at appropriate intervals.   I-Stat Chem 8, ED  (not at [facility],  [facility])     Status: Abnormal   Collection Time: 01/30/16  4:06 PM  Result Value Ref Range   Sodium 140 135 - 145 mmol/L   Potassium 3.8 3.5 - 5.1 mmol/L   Chloride 106 101 - 111 mmol/L   BUN 17 6 - 20 mg/dL   Creatinine, Ser 0.60 0.44 - 1.00 mg/dL   Glucose, Bld 105 (H) 65 - 99 mg/dL   Calcium, Ion 1.11 (L) 1.13 - 1.30 mmol/L   TCO2 21 0 - 100 mmol/L   Hemoglobin 13.9 12.0 - 15.0 g/dL   HCT 41.0 36.0 - 46.0 %  Lipid panel     Status: Abnormal   Collection Time: 01/31/16  4:33 AM  Result Value Ref Range   Cholesterol 181 0 - 200 mg/dL   Triglycerides 197 (H) <150 mg/dL   HDL 37 (L) >40 mg/dL   Total CHOL/HDL Ratio 4.9 RATIO   VLDL 39 0 - 40 mg/dL   LDL Cholesterol 105 (H) 0 - 99 mg/dL    Comment:        Total Cholesterol/HDL:CHD Risk Coronary Heart Disease Risk Table                     Men   Women  1/2 Average Risk   3.4   3.3  Average Risk       5.0   4.4  2 X Average Risk   9.6   7.1  3 X Average Risk  23.4   11.0        Use the calculated Patient Ratio above and the CHD Risk Table to determine the patient's CHD Risk.        ATP III CLASSIFICATION (LDL):  <100     mg/dL   Optimal  100-129  mg/dL   Near or Above                    Optimal  130-159  mg/dL   Borderline  160-189  mg/dL   High  >190     mg/dL   Very High   TSH     Status: None   Collection Time: 01/31/16 12:24 PM  Result Value Ref Range   TSH 1.244 0.350 - 4.500 uIU/mL    Studies/Results:  BRAIN MRI Negative for acute infarct.  Mild to moderate chronic microvascular ischemic changes in the white matter and pons. No cortical infarct.  Mild atrophy. Mild ventricular enlargement consistent with atrophy.  Negative for intracranial hemorrhage. No fluid collection.  Negative for mass or edema. No shift of the midline structures.  Pituitary normal in size. Normal skullbase. Normal orbit.  Paranasal sinuses clear. Mastoid sinus clear.  IMPRESSION: Atrophy and chronic microvascular  ischemia. No acute abnormality   BRAIN MRA  Both vertebral arteries widely patent. PICA patent bilaterally. Basilar widely patent. Superior cerebellar and posterior cerebral arteries patent bilaterally. Irregularity and mild stenosis distal PCA bilaterally.  Cavernous carotid widely patent without stenosis or aneurysm. Anterior and middle cerebral arteries patent bilaterally. Mild stenosis at the right middle cerebral artery bifurcation. No large vessel occlusion.  Negative for cerebral aneurysm.  IMPRESSION: Mild intracranial atherosclerotic disease. No flow limiting stenosis or occlusion.  CAROTID DOPPLER NORMAL      A. Merlene [name], M.D.  Diplomate, Tax adviser of Psychiatry and Neurology ( Neurology). 01/31/2016, 3:43 PM

## 2016-01-31 NOTE — Evaluation (Signed)
Speech Language Pathology Evaluation Patient Details Name: Sarah [name] MRN: [id number] DOB: 1951/01/19 Today's Date: 01/31/2016 Time: [id number] SLP Time Calculation (min) (ACUTE ONLY): 25 min  Problem List:  Patient Active Problem List   Diagnosis Date Noted  . Dyslipidemia 01/31/2016  . CVA (cerebral vascular accident) ([place]) 01/31/2016  . Expressive aphasia 01/30/2016  . Dysarthria 01/30/2016  . Acute CVA (cerebrovascular accident) ([place]) 01/30/2016  . Hypertension, uncontrolled 01/30/2016  . Dizziness   . Headache   . Slurred speech    Past Medical History:  Past Medical History  Diagnosis Date  . Neck pain, chronic   . Reflux   . Fibromyalgia   . Arthritis    Past Surgical History:  Past Surgical History  Procedure Laterality Date  . Abdominal hysterectomy    . Cholecystectomy    . Breast cyst excision     HPI:  Sarah [name] is a 65 y.o. female with hx of GERD, fibromyalgia and mild DJD presenting to ED today with complaints of headache, dizziness, blurred vision and speech difficulties. Pt with persistent speech and language difficulties despite CT and MRI of head both negative for acute intracranial abnormalities. ST to evaluate cognitive linguistics.    Assessment / Plan / Recommendation Clinical Impression  Pt presenting with mild to moderate deficits of speech and language, (more consistently aligned with neurogenic stuttering rather than aphasic presentation) and mild cognitive deficits of unknown etiology. Note no acute changes on MRI or CT of head. Pt reports some improvement of speech since admission to the hospital. Deficits characterized by slowed speaking rate and disfluency of speech including repetitions of part-word, sounds, and sentences. Naming abilities and comprehension of spoken language appeared intact, though pt with slowed processing and slowed production of speech. Pt reports initial acute confusion, while at work. MOCA administered: 19/30  indicative of mild to moderate deficits. These include decreased novel recall and decreased executive function skills. Highest educational level: 10th grade, unsure if pts deficits are consistent with baseline. Recommend skilled ST follow up for cognitive linguistics as pts PLOF is independent with all ADLs.      SLP Assessment  Patient needs continued Speech Lanaguage Pathology Services    Follow Up Recommendations       Frequency and Duration min 1 x/week  1 week      SLP Evaluation Prior Functioning  Cognitive/Linguistic Baseline: Within functional limits Type of Home: House  Lives With: Alone Available Help at Discharge: Family;Available PRN/intermittently Education: 10th grade Vocation: Part time employment   Cognition  Overall Cognitive Status: Within Functional Limits for tasks assessed Arousal/Alertness: Awake/alert Orientation Level: Oriented X4 Attention: Focused Focused Attention: Appears intact Memory: Impaired Memory Impairment: Decreased recall of new information Awareness: Appears intact Problem Solving: Appears intact Executive Function: Organizing Organizing: Impaired Organizing Impairment: Verbal complex;Functional complex Safety/Judgment: Appears intact    Comprehension  Auditory Comprehension Overall Auditory Comprehension: Appears within functional limits for tasks assessed Yes/No Questions: Within Functional Limits Visual Recognition/Discrimination Discrimination: Within Function Limits Reading Comprehension Reading Status: Within funtional limits    Expression Expression Primary Mode of Expression: Nonverbal - gestures Verbal Expression Overall Verbal Expression: Impaired Initiation: Impaired Level of Generative/Spontaneous Verbalization: Word Repetition: Impaired Naming: Impairment Pragmatics: No impairment Written Expression Dominant Hand: Right   Oral / Motor  Oral Motor/Sensory Function Overall Oral Motor/Sensory Function: Within  functional limits Motor Speech Overall Motor Speech: Impaired Respiration: Within functional limits Phonation: Normal Resonance: Within functional limits Articulation: Within functional limitis Motor Planning: Witnin functional limits Motor  Speech Errors: Groping for words;Inconsistent Effective Techniques: Slow rate;Pause   GO          Functional Assessment Tool Used: skilled observation Functional Limitations: Other Speech Language Pathology Spoken Language Expression Current Status [phone]): At least 40 percent but less than 60 percent impaired, limited or restricted Spoken Language Expression Goal Status [phone]): At least 20 percent but less than 40 percent impaired, limited or restricted         [place] [place] Pathologist    Levi Aland 01/31/2016, 4:27 PM

## 2016-01-31 NOTE — Evaluation (Signed)
Occupational Therapy Evaluation Patient Details Name: Sarah [name] MRN: [id number] DOB: [birth year] Today's Date: 01/31/2016    History of Present Illness Sarah [name] is a 65 y.o. female with hx of GERD, fibromyalgia and mild DJD presenting to ED today with complaints of headache, dizziness, blurred vision and speech difficulties. Patient says that yesterday at work she started having a headache then blurred vision and dizziness and "almost passed out". She stopped at [facility] on her way home yest and took her BP, it was "high". She is not on any BP meds and no hx of HTN. Today she still felt poor with HA's and dizziness. After lunch she develeped slurred speech and difficulty speaking, making an effort to get the words out. This persisted and she came to ED at 3:30 and code stroke was initiated w teleneurology. Low NIHSS score so was not given TPA. Denies any hx of prior TIA/ CVA or similar symptoms. Head CT no acute changes.   Clinical Impression   Pt awake, alert, oriented x4 this am, sitting up in chair, agreeable to OT evaluation. Pt reports she is much improved since arrival, continues to have slightly blurred vision and speech is staccato. Pt demonstrates independence in ADL tasks, mod independent for functional mobility. Sensation and coordination is intact bilaterally. Pt is at baseline with ADL completion, no further OT services required at this time.     Follow Up Recommendations  No OT follow up    Equipment Recommendations  None recommended by OT       Precautions / Restrictions Precautions Precautions: None Restrictions Weight Bearing Restrictions: No      Mobility Bed Mobility                  Transfers Overall transfer level: Independent                         ADL Overall ADL's : Modified independent                                             Vision Vision Assessment?: Yes Eye Alignment: Within Functional  Limits Ocular Range of Motion: Within Functional Limits Alignment/Gaze Preference: Within Defined Limits Tracking/Visual Pursuits: Able to track stimulus in all quads without difficulty Saccades: Within functional limits Convergence: Within functional limits Visual Fields: No apparent deficits          Pertinent Vitals/Pain Pain Assessment: No/denies pain     Hand Dominance Right   Extremity/Trunk Assessment Upper Extremity Assessment Upper Extremity Assessment: Overall WFL for tasks assessed   Lower Extremity Assessment Lower Extremity Assessment: Defer to PT evaluation       Communication Communication Communication: Expressive difficulties (staccato speech)   Cognition Arousal/Alertness: Awake/alert Behavior During Therapy: WFL for tasks assessed/performed Overall Cognitive Status: Within Functional Limits for tasks assessed                                Home Living Family/patient expects to be discharged to:: Private residence Living Arrangements: Alone Available Help at Discharge: Family;Available PRN/intermittently               Bathroom Shower/Tub: Tub/shower unit   Bathroom Toilet: Standard     Home Equipment: Cane - single point (does not use)  Prior Functioning/Environment Level of Independence: Independent              End of Session    Activity Tolerance: Patient tolerated treatment well Patient left: in chair;with call bell/phone within reach;with family/visitor present   Time: [id number] OT Time Calculation (min): 17 min Charges:  OT General Charges $OT Visit: 1 Procedure OT Evaluation $OT Eval Low Complexity: 1 Procedure  Guadelupe Sabin, OTR/L  [phone]  01/31/2016, 9:58 AM

## 2016-01-31 NOTE — Discharge Summary (Signed)
Physician Discharge Summary  Sarah [name] [id number] DOB: [birth year] DOA: 01/30/2016  PCP: Purvis Kilts, MD  Admit date: 01/30/2016 Discharge date: 01/31/2016  Time spent: Greater than 30 minutes  Recommendations for Outpatient Follow-up:  1. Recommend follow-up of the patient's blood pressure at the office follow-up appointment.  2. Patient encouraged to adjust her work schedule so that she does not have to work as often.   Discharge Diagnoses:  1. 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 MRI brain imaging was negative for an acute stroke. 2. Stressors at work. 3. Hypertension. 4. Mild dyslipidemia. 5. Headache with dizziness, possibly secondary to elevated blood pressure.   Discharge Condition: Improved.  Diet recommendation: heart healthy.  Filed Weights   01/30/16 1959  Weight: 74.1 kg (163 lb 5.8 oz)    History of present illness:  Patient is a 65 year old with history of GERD, fibromyalgia, and DJD, who presented to the ED on 01/30/2016 with slurred speech, headache, and dizziness. In the ED, she was afebrile and hemodynamically stable, but moderately hypertensive with a blood pressure in the 123456 to Q000111Q systolically. CT of her head revealed chronic atrophic and ischemic changes without acute abnormalities. She was admitted for further evaluation and management.  Hospital Course:  Patient was started on aspirin therapy. Analgesics were ordered as needed for headache and pain. When necessary clonidine was ordered for accelerated blood pressures. For further evaluation, number studies were ordered. MRI of the brain revealed atrophy and chronic microvascular ischemia. MRA of the brain revealed mild intracranial atherosclerotic disease but no flow-limiting stenosis or occlusion. 2-D echocardiogram revealed an LV EF of 55-60% and no regional wall motion abnormalities. Carotid ultrasound revealed no significant ICA stenosis.  Fasting lipid profile revealed a total cholesterol 181, HDL of 37, and LDL of 105. She was initially started on Lipitor, but it was discontinued when she ruled out for an acute stroke. Her TSH was within normal limits. Her vitamin B12 level was actually elevated. Neurologist, Dr. Merlene Laughter was consulted for further evaluation and management recommendations. He believed that her multiple symptoms including headaches, dizziness, and reported neck pain was likely due to her elevated blood pressure on admission. He went on to say that her stuttering dysarthria-in his experience-was almost always due to psychosomatic disorders. He recommended low-dose aspirin. I discussed starting antihypertensive medication for treatment of her hypertension with him. We both thought it would be reasonable to start low-dose hydrochlorothiazide.  At the time of discharge, her blood pressure had trended down to the 130s to the 0000000 systolically. She was discharged on HCTZ 12.5 mg daily, 81 mg aspirin, and potassium chloride. Patient did voice that she had a lot of stressors at work and was  practically working every day. She was instructed to return to work in 3 days following some rest. She was receptive.  Procedures: 2-D echocardiogram 01/31/16:Study Conclusions - Left ventricle: The cavity size was normal. Wall thickness was  increased in a pattern of mild LVH. Systolic function was normal.  The estimated ejection fraction was in the range of 55% to 60%.  Wall motion was normal; there were no regional wall motion  abnormalities. Doppler parameters are consistent with abnormal  left ventricular relaxation (grade 1 diastolic dysfunction).  - Aortic valve: Mildly calcified annulus. Trileaflet.  Consultations:  Neurology, Dr. Merlene Laughter  Discharge Exam: Filed Vitals:   01/31/16 0959 01/31/16 1300  BP: 142/80 133/86  Pulse: 89 81  Temp: 97.7 F (36.5 C) 97.8 F (36.6  C)  Resp: 18 17    General: 65 year old  Caucasian woman sitting up in the chair, in no acute distress. Cardiovascular: S1, S2, no murmurs rubs or gallops. Respiratory:  Clear to auscultation bilaterally. Neurologic/psychiatric: She is alert and oriented 3. Cranial nerves II through XII appear to be grossly intact. She does demonstrate occasional stuttering dysarthria. Strength is 5 over 5 throughout. Sensation and gait intact.  Discharge Instructions   Discharge Instructions    Diet - low sodium heart healthy    Complete by:  As directed      Increase activity slowly    Complete by:  As directed           Current Discharge Medication List    START taking these medications   Details  aspirin EC 81 MG tablet Take 1 tablet (81 mg total) by mouth daily.    hydrochlorothiazide (MICROZIDE) 12.5 MG capsule Take 1 capsule (12.5 mg total) by mouth daily. Qty: 30 capsule, Refills: 3    potassium chloride (K-DUR) 10 MEQ tablet Take 1 tablet (10 mEq total) by mouth daily. Take with  hydrochlorothiazide Qty: 30 tablet, Refills: 3      CONTINUE these medications which have NOT CHANGED   Details  acetaminophen (TYLENOL) 650 MG CR tablet Take 650 mg by mouth every 8 (eight) hours as needed for pain.    esomeprazole (NEXIUM) 40 MG capsule Take 40 mg by mouth daily.     estradiol (ESTRACE) 1 MG tablet TAKE 1 TABLET (1 MG TOTAL) BY MOUTH DAILY. Qty: 30 tablet, Refills: 9    oxyCODONE-acetaminophen (PERCOCET/ROXICET) 5-325 MG tablet Take 1 tablet by mouth every 4 (four) hours as needed for moderate pain.       Allergies  Allergen Reactions  . Penicillins Itching    Has patient had a PCN reaction causing immediate rash, facial/tongue/throat swelling, SOB or lightheadedness with hypotension: Yes Has patient had a PCN reaction causing severe rash involving mucus membranes or skin necrosis: No Has patient had a PCN reaction that required hospitalization No Has patient had a PCN reaction occurring within the last 10 years: No If  all of the above answers are "NO", then may proceed with Cephalosporin use.     Follow-up Information    Follow up with Purvis Kilts, MD. Schedule an appointment as soon as possible for a visit in 1 week.   Specialty:  Family Medicine   Contact information:   [address] [place] [id number] [phone]        The results of significant diagnostics from this hospitalization (including imaging, microbiology, ancillary and laboratory) are listed below for reference.    Significant Diagnostic Studies: Dg Chest 2 View  01/30/2016  CLINICAL DATA:  Patient with weakness and speech impairment. EXAM: CHEST  2 VIEW COMPARISON:  Chest radiograph 11/13/2015 FINDINGS: Monitoring leads overlie the patient. Stable cardiac and mediastinal contours. No consolidative pulmonary opacities. No pleural effusion or pneumothorax. Thoracic spine degenerative changes. IMPRESSION: No active cardiopulmonary disease. Electronically Signed   By: Lovey Newcomer M.D.   On: 01/30/2016 22:40   Mr Brain Wo Contrast  01/31/2016  CLINICAL DATA:  Acute stroke. EXAM: MRI HEAD WITHOUT CONTRAST TECHNIQUE: Multiplanar, multiecho pulse sequences of the brain and surrounding structures were obtained without intravenous contrast. COMPARISON:  CT head 01/30/2016.  MRI 12/18/2004 FINDINGS: Negative for acute infarct. Mild to moderate chronic microvascular ischemic changes in the white matter and pons. No cortical infarct. Mild atrophy.  Mild ventricular enlargement consistent with  atrophy. Negative for intracranial hemorrhage.  No fluid collection. Negative for mass or edema.  No shift of the midline structures. Pituitary normal in size.  Normal skullbase.  Normal orbit. Paranasal sinuses clear.  Mastoid sinus clear. IMPRESSION: Atrophy and chronic microvascular ischemia.  No acute abnormality Electronically Signed   By: Franchot Gallo M.D.   On: 01/31/2016 08:20   US Carotid Bilateral  01/31/2016  CLINICAL DATA:  Stroke,  hypertension, syncope, visual disturbance and hyperlipidemia. EXAM: BILATERAL CAROTID DUPLEX ULTRASOUND TECHNIQUE: Pearline Cables scale imaging, color Doppler and duplex ultrasound were performed of bilateral carotid and vertebral arteries in the neck. COMPARISON:  None. FINDINGS: Criteria: Quantification of carotid stenosis is based on velocity parameters that correlate the residual internal carotid diameter with NASCET-based stenosis levels, using the diameter of the distal internal carotid lumen as the denominator for stenosis measurement. The following velocity measurements were obtained: RIGHT ICA:  66/23 cm/sec CCA:  Q000111Q cm/sec SYSTOLIC ICA/CCA RATIO:  0.7 DIASTOLIC ICA/CCA RATIO:  1.0 ECA:  84 cm/sec LEFT ICA:  72/29 cm/sec CCA:  0000000 cm/sec SYSTOLIC ICA/CCA RATIO:  1.1 DIASTOLIC ICA/CCA RATIO:  1.2 ECA:  85 cm/sec RIGHT CAROTID ARTERY: There is a mild amount of partially calcified plaque at the level of the carotid bulb. No plaque is visualized in the internal carotid artery. Velocities and waveforms are normal and there is no evidence of right ICA stenosis. RIGHT VERTEBRAL ARTERY: Antegrade flow with normal waveform and velocity. LEFT CAROTID ARTERY: A mild amount of partially calcified plaque is present at the level of the carotid bulb as well as proximal internal carotid artery. Velocities and waveforms are normal. Estimated left ICA stenosis is less than 50%. LEFT VERTEBRAL ARTERY: Antegrade flow with normal waveform and velocity. IMPRESSION: Mild amount of plaque at the level of the both carotid bulbs and the proximal left ICA. There is no evidence of right ICA stenosis. Estimated left ICA stenosis is less than 50%. Electronically Signed   By: Aletta Edouard M.D.   On: 01/31/2016 08:50   Mr Jodene Nam Head/brain Wo Cm  01/31/2016  CLINICAL DATA:  Acute CVA EXAM: MRA HEAD WITHOUT CONTRAST TECHNIQUE: Angiographic images of the Circle of Willis were obtained using MRA technique without intravenous contrast.  COMPARISON:  MRI head 01/30/2016 FINDINGS: Both vertebral arteries widely patent. PICA patent bilaterally. Basilar widely patent. Superior cerebellar and posterior cerebral arteries patent bilaterally. Irregularity and mild stenosis distal PCA bilaterally. Cavernous carotid widely patent without stenosis or aneurysm. Anterior and middle cerebral arteries patent bilaterally. Mild stenosis at the right middle cerebral artery bifurcation. No large vessel occlusion. Negative for cerebral aneurysm. IMPRESSION: Mild intracranial atherosclerotic disease. No flow limiting stenosis or occlusion. Electronically Signed   By: Franchot Gallo M.D.   On: 01/31/2016 08:23   Ct Head Code Stroke W/o Cm  01/30/2016  CLINICAL DATA:  Slurred speech for 1 day EXAM: CT HEAD WITHOUT CONTRAST TECHNIQUE: Contiguous axial images were obtained from the base of the skull through the vertex without intravenous contrast. COMPARISON:  None. FINDINGS: Bony calvarium is intact. No findings to suggest acute hemorrhage, acute infarction or space-occupying mass lesion are noted. Scattered areas of decreased attenuation are noted with bowel my bilaterally as well as within the deep white matter consistent with chronic ischemic change. IMPRESSION: Chronic atrophic and ischemic changes without acute abnormality. These results were called by telephone at the time of interpretation on 01/30/2016 at 4:01 pm to Dr. Virgel Manifold , who verbally acknowledged these results. Electronically Signed  By: Inez Catalina M.D.   On: 01/30/2016 15:59    Microbiology: No results found for this or any previous visit (from the past 240 hour(s)).   Labs: Basic Metabolic Panel:  Recent Labs Lab 01/30/16 1558 01/30/16 1606  NA 138 140  K 3.6 3.8  CL 107 106  CO2 21*  --   GLUCOSE 107* 105*  BUN 16 17  CREATININE 0.78 0.60  CALCIUM 9.2  --    Liver Function Tests:  Recent Labs Lab 01/30/16 1558  AST 21  ALT 18  ALKPHOS 57  BILITOT 0.4  PROT 7.8   ALBUMIN 4.4   No results for input(s): LIPASE, AMYLASE in the last 168 hours. No results for input(s): AMMONIA in the last 168 hours. CBC:  Recent Labs Lab 01/30/16 1558 01/30/16 1606  WBC 10.6*  --   NEUTROABS 7.2  --   HGB 13.3 13.9  HCT 38.5 41.0  MCV 88.5  --   PLT 243  --    Cardiac Enzymes: No results for input(s): CKTOTAL, CKMB, CKMBINDEX, TROPONINI in the last 168 hours. BNP: BNP (last 3 results) No results for input(s): BNP in the last 8760 hours.  ProBNP (last 3 results) No results for input(s): PROBNP in the last 8760 hours.  CBG: No results for input(s): GLUCAP in the last 168 hours.     Signed:  , MD.  [facility] 01/31/2016, 5:22 PM

## 2016-01-31 NOTE — Evaluation (Signed)
Physical Therapy Evaluation Patient Details Name: Sarah [name] MRN: [id number] DOB: 12-01-1950 Today's Date: 01/31/2016   History of Present Illness  Sarah [name] is a 65 y.o. female with hx of GERD, fibromyalgia and mild DJD presenting to ED today with complaints of headache, dizziness, blurred vision and speech difficulties. Patient says that yesterday at work she started having a headache then blurred vision and dizziness and "almost passed out". She stopped at [facility] on her way home yest and took her BP, it was "high". She is not on any BP meds and no hx of HTN. Today she still felt poor with HA's and dizziness. After lunch she develeped slurred speech and difficulty speaking, making an effort to get the words out. This persisted and she came to ED at 3:30 and code stroke was initiated w teleneurology. Low NIHSS score so was not given TPA. Denies any hx of prior TIA/ CVA or similar symptoms. Head CT no acute changes.  Clinical Impression  Pt received in bed, and was agreeable to PT evaluation.  Pt demonstrated transfers and gait at independent level, however she demonstrates moderate balance deficits with scoring a 51/56 on the BERG balance test.  Although she was independent with gait, she was very guarded and demonstrated slow cadence - pt states she is normally a very fast walker.  At this time she is recommended for OPPT to continue to treat balance deficits.     Follow Up Recommendations Outpatient PT    Equipment Recommendations  None recommended by PT    Recommendations for Other Services       Precautions / Restrictions Precautions Precautions: None Restrictions Weight Bearing Restrictions: No      Mobility  Bed Mobility Overal bed mobility: Modified Independent                Transfers Overall transfer level: Independent Equipment used: None                Ambulation/Gait Ambulation/Gait assistance: Min guard Ambulation Distance (Feet): 400  Feet Assistive device: None Gait Pattern/deviations: WFL(Within Functional Limits)     General Gait Details: Pt does demonstrate guarded gait with decreased cadence and speed.  Pt states she is normally a fast walker, but she is choosing to go slow so that she doesn't fall.   Stairs            Wheelchair Mobility    Modified Rankin (Stroke Patients Only)       Balance Overall balance assessment: Needs assistance Sitting-balance support: No upper extremity supported       Standing balance support: No upper extremity supported                 High level balance activites: Head turns High Level Balance Comments: Pt demonstrated gait x 38f with vertical and horizontal head turns.  Pt initally stopped when turing head, but once cued to continue gait during head movement, she was able to complete task as asked.  Standardized Balance Assessment Standardized Balance Assessment : Berg Balance Test Berg Balance Test Sit to Stand: Able to stand without using hands and stabilize independently Standing Unsupported: Able to stand safely 2 minutes Sitting with Back Unsupported but Feet Supported on Floor or Stool: Able to sit safely and securely 2 minutes Stand to Sit: Sits safely with minimal use of hands Transfers: Able to transfer safely, minor use of hands Standing Unsupported with Eyes Closed: Able to stand 10 seconds safely Standing Ubsupported with Feet Together:  Able to place feet together independently and stand 1 minute safely From Standing, Reach Forward with Outstretched Arm: Can reach forward >12 cm safely (5") From Standing Position, Pick up Object from Floor: Able to pick up shoe, needs supervision From Standing Position, Turn to Look Behind Over each Shoulder: Looks behind from both sides and weight shifts well Turn 360 Degrees: Able to turn 360 degrees safely but slowly Standing Unsupported, Alternately Place Feet on Step/Stool: Able to stand independently and  safely and complete 8 steps in 20 seconds Standing Unsupported, One Foot in Front: Able to place foot tandem independently and hold 30 seconds Standing on One Leg: Able to lift leg independently and hold 5-10 seconds Total Score: 51         Pertinent Vitals/Pain Pain Assessment: 0-10 Pain Score: 5  Pain Location: HA Pain Descriptors / Indicators: Aching Pain Intervention(s): Limited activity within patient's tolerance    Home Living Family/patient expects to be discharged to:: Private residence Living Arrangements: Alone Available Help at Discharge: Family;Available PRN/intermittently Type of Home: House       Home Layout: Two level (Pt does not utilize the upper level. ) Home Equipment: Cane - single point      Prior Function Level of Independence: Independent         Comments: Pt works, and still drives.     Hand Dominance        Extremity/Trunk Assessment   Upper Extremity Assessment: Defer to OT evaluation           Lower Extremity Assessment: Overall WFL for tasks assessed         Communication   Communication: Expressive difficulties (word finding issues, and slowed speech. )  Cognition Arousal/Alertness: Awake/alert Behavior During Therapy: WFL for tasks assessed/performed Overall Cognitive Status: Within Functional Limits for tasks assessed                      General Comments General comments (skin integrity, edema, etc.): Encouraged pt to use cane for ambulation, especially when on uneven surfaces such as grass, gravel, or concrete due to balance deficits.     Exercises        Assessment/Plan    PT Assessment All further PT needs can be met in the next venue of care  PT Diagnosis Difficulty walking;Abnormality of gait   PT Problem List Decreased balance;Decreased mobility  PT Treatment Interventions Gait training;Functional mobility training;Balance training;Therapeutic activities   PT Goals (Current goals can be found in  the Care Plan section) Acute Rehab PT Goals Patient Stated Goal: pt wants to go home.  PT Goal Formulation: With patient Time For Goal Achievement: 01/31/16 Potential to Achieve Goals: Good    Frequency     Barriers to discharge Decreased caregiver support      Co-evaluation               End of Session Equipment Utilized During Treatment: Gait belt Activity Tolerance: Patient tolerated treatment well Patient left: in bed;with call bell/phone within reach Nurse Communication: Mobility status    Functional Assessment Tool Used: Clinical Judgement.  Functional Limitation: Mobility: Walking and moving around Mobility: Walking and Moving Around Current Status [phone]): At least 1 percent but less than 20 percent impaired, limited or restricted Mobility: Walking and Moving Around Goal Status [phone]): At least 1 percent but less than 20 percent impaired, limited or restricted Mobility: Walking and Moving Around Discharge Status [phone]): At least 1 percent but less than 20 percent  impaired, limited or restricted    Time: 1329-1356 PT Time Calculation (min) (ACUTE ONLY): 27 min   Charges:   PT Evaluation $PT Eval Moderate Complexity: 1 Procedure PT Treatments $Therapeutic Exercise: 8-22 mins   PT G Codes:   PT G-Codes **NOT FOR INPATIENT CLASS** Functional Assessment Tool Used: Clinical Judgement.  Functional Limitation: Mobility: Walking and moving around Mobility: Walking and Moving Around Current Status [phone]): At least 1 percent but less than 20 percent impaired, limited or restricted Mobility: Walking and Moving Around Goal Status [phone]): At least 1 percent but less than 20 percent impaired, limited or restricted Mobility: Walking and Moving Around Discharge Status [phone]): At least 1 percent but less than 20 percent impaired, limited or restricted    Ladoris Gene 01/31/2016, 4:16 PM

## 2016-02-01 LAB — HEMOGLOBIN A1C
HEMOGLOBIN A1C: 6 % — AB (ref 4.8–5.6)
MEAN PLASMA GLUCOSE: 126 mg/dL

## 2016-05-25 ENCOUNTER — Other Ambulatory Visit: Payer: Self-pay | Admitting: Obstetrics and Gynecology

## 2016-05-26 NOTE — Telephone Encounter (Signed)
Refilled thru Feb '18

## 2016-11-15 ENCOUNTER — Other Ambulatory Visit: Payer: Self-pay | Admitting: Obstetrics and Gynecology

## 2016-11-18 NOTE — Telephone Encounter (Signed)
E=refil estradiol x 2 months, needs appt

## 2016-12-19 ENCOUNTER — Other Ambulatory Visit: Payer: Self-pay | Admitting: Obstetrics and Gynecology

## 2017-03-14 ENCOUNTER — Other Ambulatory Visit: Payer: Self-pay | Admitting: Obstetrics and Gynecology

## 2017-03-17 ENCOUNTER — Telehealth: Payer: Self-pay | Admitting: Obstetrics and Gynecology

## 2017-03-17 NOTE — Telephone Encounter (Signed)
Pt called requesting a refill on her estradiol. I advised pt,after confirming with Dr Glo Herring, that she would need to make an appt to be seen in order to get a refill since it had been almost 75mos since her last visit. Pt verbalized understanding.

## 2017-03-17 NOTE — Telephone Encounter (Signed)
Pt called stating that she would like to speak with Dr. Glo Herring Nurse, Patient did not state the reason why. Please contact pt

## 2017-03-17 NOTE — Telephone Encounter (Signed)
Estrace refil x 1 yr

## 2017-03-31 ENCOUNTER — Other Ambulatory Visit: Payer: 59 | Admitting: Obstetrics and Gynecology

## 2017-04-21 ENCOUNTER — Emergency Department (HOSPITAL_COMMUNITY)
Admission: EM | Admit: 2017-04-21 | Discharge: 2017-04-21 | Disposition: A | Discharge status: Home / Self Care | Payer: 59 | Attending: Emergency Medicine | Admitting: Emergency Medicine

## 2017-04-21 ENCOUNTER — Emergency Department (HOSPITAL_COMMUNITY): Payer: 59

## 2017-04-21 ENCOUNTER — Encounter (HOSPITAL_COMMUNITY): Payer: Self-pay | Admitting: Emergency Medicine

## 2017-04-21 DIAGNOSIS — R4781 Slurred speech: Secondary | ICD-10-CM

## 2017-04-21 DIAGNOSIS — I10 Essential (primary) hypertension: Secondary | ICD-10-CM | POA: Insufficient documentation

## 2017-04-21 LAB — URINALYSIS, ROUTINE W REFLEX MICROSCOPIC
Bilirubin Urine: NEGATIVE
GLUCOSE, UA: NEGATIVE mg/dL
Hgb urine dipstick: NEGATIVE
Ketones, ur: NEGATIVE mg/dL
LEUKOCYTES UA: NEGATIVE
Nitrite: NEGATIVE
PH: 7 (ref 5.0–8.0)
Protein, ur: NEGATIVE mg/dL
Specific Gravity, Urine: 1.004 — ABNORMAL LOW (ref 1.005–1.030)

## 2017-04-21 LAB — RAPID URINE DRUG SCREEN, HOSP PERFORMED
Amphetamines: NOT DETECTED
BARBITURATES: NOT DETECTED
BENZODIAZEPINES: NOT DETECTED
COCAINE: NOT DETECTED
Opiates: NOT DETECTED
TETRAHYDROCANNABINOL: NOT DETECTED

## 2017-04-21 LAB — CBC WITH DIFFERENTIAL/PLATELET
Basophils Absolute: 0 K/uL (ref 0.0–0.1)
Basophils Relative: 0 %
Eosinophils Absolute: 0.1 K/uL (ref 0.0–0.7)
Eosinophils Relative: 1 %
HCT: 36.1 % (ref 36.0–46.0)
Hemoglobin: 12.4 g/dL (ref 12.0–15.0)
Lymphocytes Relative: 25 %
Lymphs Abs: 2 K/uL (ref 0.7–4.0)
MCH: 30.5 pg (ref 26.0–34.0)
MCHC: 34.3 g/dL (ref 30.0–36.0)
MCV: 88.9 fL (ref 78.0–100.0)
Monocytes Absolute: 0.4 K/uL (ref 0.1–1.0)
Monocytes Relative: 5 %
Neutro Abs: 5.6 K/uL (ref 1.7–7.7)
Neutrophils Relative %: 69 %
Platelets: 230 K/uL (ref 150–400)
RBC: 4.06 MIL/uL (ref 3.87–5.11)
RDW: 12.6 % (ref 11.5–15.5)
WBC: 8.1 K/uL (ref 4.0–10.5)

## 2017-04-21 LAB — TROPONIN I: Troponin I: <0.03 ng/mL (ref <0.03)

## 2017-04-21 LAB — ETHANOL: Alcohol, Ethyl (B): <5 mg/dL

## 2017-04-21 LAB — COMPREHENSIVE METABOLIC PANEL
ALK PHOS: 62 U/L (ref 38–126)
ALT: 28 U/L (ref 14–54)
ANION GAP: 8 (ref 5–15)
AST: 33 U/L (ref 15–41)
Albumin: 3.8 g/dL (ref 3.5–5.0)
BILIRUBIN TOTAL: 0.5 mg/dL (ref 0.3–1.2)
BUN: 13 mg/dL (ref 6–20)
CO2: 23 mmol/L (ref 22–32)
Calcium: 8.7 mg/dL — ABNORMAL LOW (ref 8.9–10.3)
Chloride: 107 mmol/L (ref 101–111)
Creatinine, Ser: 0.63 mg/dL (ref 0.44–1.00)
Glucose, Bld: 96 mg/dL (ref 65–99)
POTASSIUM: 3.5 mmol/L (ref 3.5–5.1)
Sodium: 138 mmol/L (ref 135–145)
TOTAL PROTEIN: 6.9 g/dL (ref 6.5–8.1)

## 2017-04-21 LAB — CBG MONITORING, ED: Glucose-Capillary: 92 mg/dL (ref 65–99)

## 2017-04-21 NOTE — ED Provider Notes (Signed)
[facility] DEPT Provider Note   CSN: [id number] Arrival date & time: 04/21/17  0857     History   Chief Complaint Chief Complaint  Patient presents with  . Aphasia    HPI Sarah [name] is a 66 y.o. female.  HPI  Pt was seen at 0905. Per pt's co-worker and pt: Pt c/o gradual onset and persistence of "slurred speech" for the past 4 days. Has been associated with no other symptoms. Denies visual changes, no facial droop, no dysphagia, no headache, no CP/SOB, no abd pain, no N/V/D, no focal motor weakness, no tingling/numbness in extremities.   Past Medical History:  Diagnosis Date  . Arthritis   . Fibromyalgia   . Neck pain, chronic   . 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   . Reflux     Patient Active Problem List   Diagnosis Date Noted  . Dyslipidemia 01/31/2016  . Psychosomatic factor in physical condition 01/31/2016  . Aphasia   . Blurred vision   . Essential hypertension   . Expressive aphasia 01/30/2016  . Dysarthria 01/30/2016  . Hypertension, uncontrolled 01/30/2016  . Dizziness   . Headache   . Slurred speech     Past Surgical History:  Procedure Laterality Date  . ABDOMINAL HYSTERECTOMY    . BREAST CYST EXCISION    . CHOLECYSTECTOMY      OB History    No data available       Home Medications    Prior to Admission medications   Medication Sig Start Date End Date Taking? Authorizing Provider  acetaminophen (TYLENOL) 650 MG CR tablet Take 650 mg by mouth every 8 (eight) hours as needed for pain.   Yes [provider]  esomeprazole (NEXIUM) 40 MG capsule Take 40 mg by mouth daily.    Yes [provider]  estradiol (ESTRACE) 1 MG tablet TAKE 1 TABLET (1 MG TOTAL) BY MOUTH DAILY. 03/17/17  Yes Jonnie Kind, MD  oxyCODONE-acetaminophen (PERCOCET/ROXICET) 5-325 MG tablet Take 1 tablet by mouth every 4 (four) hours as needed for  moderate pain.   Yes [provider]    Family History Family History  Problem Relation Age of Onset  . Heart disease Mother   . Diabetes Maternal Grandmother   . Heart disease Maternal Grandmother   . Cancer Maternal Aunt     Social History Social History  Substance Use Topics  . Smoking status: Never Smoker  . Smokeless tobacco: Never Used  . Alcohol use No     Allergies   Penicillins   Review of Systems Review of Systems ROS: Statement: All systems negative except as marked or noted in the HPI; Constitutional: Negative for fever and chills. ; ; Eyes: Negative for eye pain, redness and discharge. ; ; ENMT: Negative for ear pain, hoarseness, nasal congestion, sinus pressure and sore throat. ; ; Cardiovascular: Negative for chest pain, palpitations, diaphoresis, dyspnea and peripheral edema. ; ; Respiratory: Negative for cough, wheezing and stridor. ; ; Gastrointestinal: Negative for nausea, vomiting, diarrhea, abdominal pain, blood in stool, hematemesis, jaundice and rectal bleeding. . ; ; Genitourinary: Negative for dysuria, flank pain and hematuria. ; ; Musculoskeletal: Negative for back pain and neck pain. Negative for swelling and trauma.; ; Skin: Negative for pruritus, rash, abrasions, blisters, bruising and skin lesion.; ; Neuro: +slurred speech. Negative for headache, lightheadedness and neck stiffness. Negative for weakness, altered level of consciousness, altered  mental status, extremity weakness, paresthesias, involuntary movement, seizure and syncope.      Physical Exam Updated Vital Signs BP 140/76   Pulse 63   Temp 97.9 F (36.6 C)   Resp 20   Ht 5\' 2"  (1.575 m)   Wt 68.9 kg (152 lb)   SpO2 100%   BMI 27.80 kg/m   BP 136/75   Pulse 74   Temp 97.9 F (36.6 C)   Resp 17   Ht 5\' 2"  (1.575 m)   Wt 68.9 kg (152 lb)   SpO2 100%   BMI 27.80 kg/m   Physical Exam 0910: Physical examination:  Nursing notes reviewed; Vital signs and O2 SAT  reviewed;  Constitutional: Well developed, Well nourished, Well hydrated, In no acute distress; Head:  Normocephalic, atraumatic; Eyes: EOMI, PERRL, No scleral icterus; ENMT: Mouth and pharynx normal, Mucous membranes moist. Mouth and pharynx without lesions. No tonsillar exudates. No intra-oral edema. No submandibular or sublingual edema. No hoarse voice, no drooling, no stridor. No trismus.; Neck: Supple, Full range of motion, No lymphadenopathy; Cardiovascular: Regular rate and rhythm, No gallop; Respiratory: Breath sounds clear & equal bilaterally, No wheezes.  Speaking full sentences with ease, Normal respiratory effort/excursion; Chest: Nontender, Movement normal; Abdomen: Soft, Nontender, Nondistended, Normal bowel sounds; Genitourinary: No CVA tenderness; Extremities: Pulses normal, No tenderness, No edema, No calf edema or asymmetry.; Neuro: AA&Ox3, Major CN grossly intact. Speech slurred.  No facial droop. Grips equal. Strength 5/5 equal bilat UE's and LE's.  DTR 2/4 equal bilat UE's and LE's.  No gross sensory deficits.  Normal cerebellar testing bilat UE's (finger-nose) and LE's (heel-shin).; Skin: Color normal, Warm, Dry.   ED Treatments / Results  Labs (all labs ordered are listed, but only abnormal results are displayed)   EKG  EKG Interpretation  Date/Time:  Wednesday April 21 2017 09:16:00 EDT Ventricular Rate:  61 PR Interval:    QRS Duration: 96 QT Interval:  450 QTC Calculation: 454 R Axis:   -46 Text Interpretation:  Sinus rhythm LAD, consider left anterior fascicular block Low voltage, precordial leads Abnormal R-wave progression, late transition When compared with ECG of 01/30/2016 No significant change was found Confirmed by [facility]  MD, Nunzio Cory [phone]) on 04/21/2017 11:38:43 AM       Radiology   Procedures Procedures (including critical care time)  Medications Ordered in ED Medications - No data to display   Initial Impression / Assessment and Plan / ED Course    I have reviewed the triage vital signs and the nursing notes.  Pertinent labs & imaging results that were available during my care of the patient were reviewed by me and considered in my medical decision making (see chart for details).  MDM Reviewed: previous chart, nursing note and vitals Reviewed previous: labs, ECG and MRI Interpretation: labs, ECG and MRI   Results for orders placed or performed during the hospital encounter of 04/21/17  Comprehensive metabolic panel  Result Value Ref Range   Sodium 138 135 - 145 mmol/L   Potassium 3.5 3.5 - 5.1 mmol/L   Chloride 107 101 - 111 mmol/L   CO2 23 22 - 32 mmol/L   Glucose, Bld 96 65 - 99 mg/dL   BUN 13 6 - 20 mg/dL   Creatinine, Ser 0.63 0.44 - 1.00 mg/dL   Calcium 8.7 (L) 8.9 - 10.3 mg/dL   Total Protein 6.9 6.5 - 8.1 g/dL   Albumin 3.8 3.5 - 5.0 g/dL   AST 33 15 - 41 U/L  ALT 28 14 - 54 U/L   Alkaline Phosphatase 62 38 - 126 U/L   Total Bilirubin 0.5 0.3 - 1.2 mg/dL   GFR calc non Af Amer >60 >60 mL/min   GFR calc Af Amer >60 >60 mL/min   Anion gap 8 5 - 15  Ethanol  Result Value Ref Range   Alcohol, Ethyl (B) <5 <5 mg/dL  CBC with Differential  Result Value Ref Range   WBC 8.1 4.0 - 10.5 K/uL   RBC 4.06 3.87 - 5.11 MIL/uL   Hemoglobin 12.4 12.0 - 15.0 g/dL   HCT 36.1 36.0 - 46.0 %   MCV 88.9 78.0 - 100.0 fL   MCH 30.5 26.0 - 34.0 pg   MCHC 34.3 30.0 - 36.0 g/dL   RDW 12.6 11.5 - 15.5 %   Platelets 230 150 - 400 K/uL   Neutrophils Relative % 69 %   Neutro Abs 5.6 1.7 - 7.7 K/uL   Lymphocytes Relative 25 %   Lymphs Abs 2.0 0.7 - 4.0 K/uL   Monocytes Relative 5 %   Monocytes Absolute 0.4 0.1 - 1.0 K/uL   Eosinophils Relative 1 %   Eosinophils Absolute 0.1 0.0 - 0.7 K/uL   Basophils Relative 0 %   Basophils Absolute 0.0 0.0 - 0.1 K/uL  Urine rapid drug screen (hosp performed)  Result Value Ref Range   Opiates NONE DETECTED NONE DETECTED   Cocaine NONE DETECTED NONE DETECTED   Benzodiazepines NONE DETECTED  NONE DETECTED   Amphetamines NONE DETECTED NONE DETECTED   Tetrahydrocannabinol NONE DETECTED NONE DETECTED   Barbiturates NONE DETECTED NONE DETECTED  Urinalysis, Routine w reflex microscopic  Result Value Ref Range   Color, Urine STRAW (A) YELLOW   APPearance CLEAR CLEAR   Specific Gravity, Urine 1.004 (L) 1.005 - 1.030   pH 7.0 5.0 - 8.0   Glucose, UA NEGATIVE NEGATIVE mg/dL   Hgb urine dipstick NEGATIVE NEGATIVE   Bilirubin Urine NEGATIVE NEGATIVE   Ketones, ur NEGATIVE NEGATIVE mg/dL   Protein, ur NEGATIVE NEGATIVE mg/dL   Nitrite NEGATIVE NEGATIVE   Leukocytes, UA NEGATIVE NEGATIVE  Troponin I  Result Value Ref Range   Troponin I <0.03 <0.03 ng/mL  CBG monitoring, ED  Result Value Ref Range   Glucose-Capillary 92 65 - 99 mg/dL   Mr Brain Wo Contrast (neuro Protocol) Result Date: 04/21/2017 CLINICAL DATA:  Slurred speech beginning 5 days ago. EXAM: MRI HEAD WITHOUT CONTRAST TECHNIQUE: Multiplanar, multiecho pulse sequences of the brain and surrounding structures were obtained without intravenous contrast. COMPARISON:  MRI brain 01/31/2016. FINDINGS: Brain: Moderate periventricular and subcortical T2 hyperintensities bilaterally are similar the prior study. Moderate atrophy is stable. The diffusion-weighted images demonstrate no acute or subacute infarction. The ventricles are proportionate to the degree of atrophy. No significant extra-axial fluid collection is present. The brainstem and cerebellum are unremarkable. Vascular: Flow is present in the major intracranial arteries. Skull and upper cervical spine: The skullbase is within normal limits. Midline cyst scratched at the craniocervical junction is normal. Midline sagittal structures are within normal limits. Sinuses/Orbits: The paranasal sinuses and mastoid air cells are clear. The globes and orbits are within normal limits. IMPRESSION: 1. Stable moderate atrophy and white matter disease. This likely reflects the sequela of chronic  microvascular ischemia. 2. No acute intracranial abnormality or significant interval change. Electronically Signed   By: San Morelle M.D.   On: 04/21/2017 10:02    1150:  Workup reassuring; no clear indication for admission at this  time.  T/C to [facility] Neuro Dr. Leonel Ramsay, case discussed, including:  HPI, pertinent PM/SHx, VS/PE, dx testing, ED course and treatment:  Agrees no clear indication for admission at this time, pt can be d/c and f/u with Dr. Merlene Laughter.  Dx and testing d/w pt.  Questions answered.  Verb understanding, agreeable to d/c home with outpt f/u.   Final Clinical Impressions(s) / ED Diagnoses   Final diagnoses:  None    New Prescriptions New Prescriptions   No medications on file     Francine Graven, DO 04/26/17 1515

## 2017-04-21 NOTE — Discharge Instructions (Signed)
Take your usual prescriptions as previously directed.  Call your regular medical doctor today to schedule a follow up appointment within the next 2 to 3 days. Call your Neurologist today to schedule a follow up appointment within the next week.  Return to the Emergency Department immediately sooner if worsening.

## 2017-04-21 NOTE — ED Triage Notes (Signed)
Pt c/o slurred speech since Saturday.

## 2017-10-21 ENCOUNTER — Encounter: Payer: Self-pay | Admitting: Obstetrics and Gynecology

## 2017-10-21 ENCOUNTER — Ambulatory Visit (INDEPENDENT_AMBULATORY_CARE_PROVIDER_SITE_OTHER): Payer: 59 | Admitting: Obstetrics and Gynecology

## 2017-10-21 ENCOUNTER — Other Ambulatory Visit: Payer: Self-pay

## 2017-10-21 VITALS — BP 104/68 | HR 85 | Ht 62.5 in | Wt 161.0 lb

## 2017-10-21 DIAGNOSIS — N3941 Urge incontinence: Secondary | ICD-10-CM | POA: Diagnosis not present

## 2017-10-21 DIAGNOSIS — N816 Rectocele: Secondary | ICD-10-CM | POA: Diagnosis not present

## 2017-10-21 DIAGNOSIS — Z01411 Encounter for gynecological examination (general) (routine) with abnormal findings: Secondary | ICD-10-CM | POA: Diagnosis not present

## 2017-10-21 MED ORDER — ESTRADIOL 1 MG PO TABS: ORAL_TABLET | ORAL | 4 refills | Status: DC

## 2017-10-21 NOTE — Progress Notes (Signed)
Assessment:  1  Annual Gyn Exam 2  Urge Incontinence 3  Recurrent small rectocele  Plan:  1. pap smear not indicated s/p total abdominal hysterectomy 2. return in 6 months for gyn f/u re urge incont and rectocele (after retirement) 3    Annual mammogram advised after age 65 4   Rx Vesicare for incontinence, Estradiol Rx renewed  Subjective:  Sarah [name] is a 67 y.o. female No obstetric history on file. who presents for annual exam. No LMP recorded. Patient has had a hysterectomy. The patient has complaints today of incontinence all the time. She has both stress and urge incontinence. She wakes up 5-6 times a night to urinate. She is having problems at work as well. She has not been sexually active for 9 years. She does have difficulty with bowel movements getting stuck. She has had posterior repair previously, and states it has been getting 'bad'. She will be retiring this April. She is still taking the estrace tablets.  Pt has hx of fibromyalgia, arthritis, total abd. Hysterectomy, posterior repair.    The following portions of the patient's history were reviewed and updated as appropriate: allergies, current medications, past family history, past medical history, past social history, past surgical history and problem list. Past Medical History:  Diagnosis Date  . Arthritis   . Fibromyalgia   . Neck pain, chronic   . 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   . Reflux     Past Surgical History:  Procedure Laterality Date  . ABDOMINAL HYSTERECTOMY    . BREAST CYST EXCISION    . CHOLECYSTECTOMY       Current Outpatient Medications:  .  acetaminophen (TYLENOL) 650 MG CR tablet, Take 650 mg by mouth every 8 (eight) hours as needed for pain., Disp: , Rfl:  .  esomeprazole (NEXIUM) 40 MG capsule, Take 40 mg by mouth daily. , Disp: , Rfl:  .  estradiol (ESTRACE) 1 MG tablet, TAKE 1  TABLET (1 MG TOTAL) BY MOUTH DAILY., Disp: 30 tablet, Rfl: 11 .  lisinopril (PRINIVIL,ZESTRIL) 10 MG tablet, Take 10 mg by mouth daily., Disp: , Rfl:  .  oxyCODONE-acetaminophen (PERCOCET/ROXICET) 5-325 MG tablet, Take 1 tablet by mouth every 4 (four) hours as needed for moderate pain., Disp: , Rfl:   Review of Systems Constitutional: negative Gastrointestinal: negative Genitourinary: negative  Objective:  BP 104/68 (BP Location: Right Arm, Patient Position: Sitting, Cuff Size: Normal)   Pulse 85   Ht 5' 2.5" (1.588 m)   Wt 161 lb (73 kg)   BMI 28.98 kg/m    BMI: Body mass index is 28.98 kg/m.  General Appearance: Alert, appropriate appearance for age. No acute distress HEENT: Grossly normal Cardiovascular: RRR; normal S1, S2, no murmur Lungs: CTA bilaterally Back: No CVAT Breast Exam: No masses or nodes.No dimpling, nipple retraction or discharge. Gastrointestinal: Soft, non-tender, no masses or organomegaly Pelvic Exam:  External genitalia: normal general appearance Vaginal: normal mucosa without prolapse or lesions, good vaginal and bladder support, atrophic tissues, cuff is well healed Cervix: removed surgically Adnexa: removed surgically Uterus: removed surgically  Rectovaginal: normal rectal, no masses and guaiac negative stool obtained Lymphatic Exam: Non-palpable nodes in neck, clavicular, axillary, or inguinal regions  Skin: no rash or abnormalities Neurologic: Normal gait and speech, no tremor  Psychiatric: Alert and oriented, appropriate affect.  Urinalysis:Not done  Mallory Shirk. MD Pgr [phone] 3:05 PM   By  signing my name below, I, Izna Ahmed, attest that this documentation has been prepared under the direction and in the presence of Jonnie Kind, MD. Electronically Signed: Jabier Gauss, Medical Scribe. 10/21/17. 3:05 PM.  I personally performed the services described in this documentation, which was SCRIBED in my presence. The recorded information  has been reviewed and considered accurate. It has been edited as necessary during review. Jonnie Kind, MD

## 2017-10-21 NOTE — Patient Instructions (Signed)

## 2018-03-30 ENCOUNTER — Other Ambulatory Visit: Payer: Self-pay | Admitting: Obstetrics and Gynecology

## 2018-04-19 DIAGNOSIS — M79672 Pain in left foot: Secondary | ICD-10-CM | POA: Diagnosis not present

## 2018-04-19 DIAGNOSIS — M76822 Posterior tibial tendinitis, left leg: Secondary | ICD-10-CM | POA: Diagnosis not present

## 2018-05-06 ENCOUNTER — Telehealth: Payer: Self-pay | Admitting: *Deleted

## 2018-05-06 NOTE — Telephone Encounter (Signed)
Pharmacy made aware Estradiol tablets [place] approved.

## 2018-05-10 DIAGNOSIS — M76822 Posterior tibial tendinitis, left leg: Secondary | ICD-10-CM | POA: Diagnosis not present

## 2018-05-10 DIAGNOSIS — M79672 Pain in left foot: Secondary | ICD-10-CM | POA: Diagnosis not present

## 2018-06-02 DIAGNOSIS — B070 Plantar wart: Secondary | ICD-10-CM | POA: Diagnosis not present

## 2018-06-02 DIAGNOSIS — M79672 Pain in left foot: Secondary | ICD-10-CM | POA: Diagnosis not present

## 2018-06-10 DIAGNOSIS — Z0001 Encounter for general adult medical examination with abnormal findings: Secondary | ICD-10-CM | POA: Diagnosis not present

## 2018-06-10 DIAGNOSIS — Z6830 Body mass index (BMI) 30.0-30.9, adult: Secondary | ICD-10-CM | POA: Diagnosis not present

## 2018-06-10 DIAGNOSIS — Z1389 Encounter for screening for other disorder: Secondary | ICD-10-CM | POA: Diagnosis not present

## 2018-06-10 DIAGNOSIS — E6609 Other obesity due to excess calories: Secondary | ICD-10-CM | POA: Diagnosis not present

## 2018-06-10 DIAGNOSIS — K625 Hemorrhage of anus and rectum: Secondary | ICD-10-CM | POA: Diagnosis not present

## 2018-06-10 DIAGNOSIS — R7309 Other abnormal glucose: Secondary | ICD-10-CM | POA: Diagnosis not present

## 2018-06-13 DIAGNOSIS — M150 Primary generalized (osteo)arthritis: Secondary | ICD-10-CM | POA: Diagnosis not present

## 2018-06-13 DIAGNOSIS — M797 Fibromyalgia: Secondary | ICD-10-CM | POA: Diagnosis not present

## 2018-06-13 DIAGNOSIS — E669 Obesity, unspecified: Secondary | ICD-10-CM | POA: Diagnosis not present

## 2018-06-13 DIAGNOSIS — M5030 Other cervical disc degeneration, unspecified cervical region: Secondary | ICD-10-CM | POA: Diagnosis not present

## 2018-06-13 DIAGNOSIS — M2550 Pain in unspecified joint: Secondary | ICD-10-CM | POA: Diagnosis not present

## 2018-06-13 DIAGNOSIS — Z6830 Body mass index (BMI) 30.0-30.9, adult: Secondary | ICD-10-CM | POA: Diagnosis not present

## 2018-06-16 ENCOUNTER — Other Ambulatory Visit (HOSPITAL_COMMUNITY): Payer: Self-pay | Admitting: Family Medicine

## 2018-06-16 DIAGNOSIS — Z1239 Encounter for other screening for malignant neoplasm of breast: Secondary | ICD-10-CM

## 2018-06-16 DIAGNOSIS — E2839 Other primary ovarian failure: Secondary | ICD-10-CM

## 2018-06-21 DIAGNOSIS — M79672 Pain in left foot: Secondary | ICD-10-CM | POA: Diagnosis not present

## 2018-06-21 DIAGNOSIS — M7662 Achilles tendinitis, left leg: Secondary | ICD-10-CM | POA: Diagnosis not present

## 2018-06-27 ENCOUNTER — Other Ambulatory Visit (HOSPITAL_COMMUNITY): Payer: 59

## 2018-06-27 ENCOUNTER — Ambulatory Visit (HOSPITAL_COMMUNITY): Payer: 59

## 2018-07-05 DIAGNOSIS — R1310 Dysphagia, unspecified: Secondary | ICD-10-CM | POA: Diagnosis not present

## 2018-07-05 DIAGNOSIS — K625 Hemorrhage of anus and rectum: Secondary | ICD-10-CM | POA: Diagnosis not present

## 2018-07-05 DIAGNOSIS — K219 Gastro-esophageal reflux disease without esophagitis: Secondary | ICD-10-CM | POA: Diagnosis not present

## 2018-07-06 ENCOUNTER — Ambulatory Visit (HOSPITAL_COMMUNITY)
Admission: RE | Admit: 2018-07-06 | Discharge: 2018-07-06 | Disposition: A | Discharge status: Home / Self Care | Payer: PPO | Source: Ambulatory Visit | Attending: Family Medicine | Admitting: Family Medicine

## 2018-07-06 DIAGNOSIS — E2839 Other primary ovarian failure: Secondary | ICD-10-CM | POA: Insufficient documentation

## 2018-07-06 DIAGNOSIS — Z780 Asymptomatic menopausal state: Secondary | ICD-10-CM | POA: Diagnosis not present

## 2018-07-06 DIAGNOSIS — Z13820 Encounter for screening for osteoporosis: Secondary | ICD-10-CM | POA: Diagnosis not present

## 2018-07-06 DIAGNOSIS — Z1239 Encounter for other screening for malignant neoplasm of breast: Secondary | ICD-10-CM

## 2018-07-06 DIAGNOSIS — Z1231 Encounter for screening mammogram for malignant neoplasm of breast: Secondary | ICD-10-CM | POA: Diagnosis not present

## 2018-07-12 DIAGNOSIS — M7662 Achilles tendinitis, left leg: Secondary | ICD-10-CM | POA: Diagnosis not present

## 2018-07-12 DIAGNOSIS — M79672 Pain in left foot: Secondary | ICD-10-CM | POA: Diagnosis not present

## 2018-07-28 ENCOUNTER — Ambulatory Visit: Payer: PPO

## 2018-07-28 DIAGNOSIS — D124 Benign neoplasm of descending colon: Secondary | ICD-10-CM | POA: Diagnosis not present

## 2018-07-28 DIAGNOSIS — R1310 Dysphagia, unspecified: Secondary | ICD-10-CM | POA: Diagnosis not present

## 2018-07-28 DIAGNOSIS — K625 Hemorrhage of anus and rectum: Secondary | ICD-10-CM | POA: Diagnosis not present

## 2018-07-28 DIAGNOSIS — D123 Benign neoplasm of transverse colon: Secondary | ICD-10-CM | POA: Diagnosis not present

## 2018-07-28 DIAGNOSIS — K635 Polyp of colon: Secondary | ICD-10-CM | POA: Diagnosis not present

## 2018-07-28 DIAGNOSIS — K2930 Chronic superficial gastritis without bleeding: Secondary | ICD-10-CM | POA: Diagnosis not present

## 2018-07-28 DIAGNOSIS — K228 Other specified diseases of esophagus: Secondary | ICD-10-CM | POA: Diagnosis not present

## 2018-07-28 DIAGNOSIS — K314 Gastric diverticulum: Secondary | ICD-10-CM | POA: Diagnosis not present

## 2018-08-03 DIAGNOSIS — K228 Other specified diseases of esophagus: Secondary | ICD-10-CM | POA: Diagnosis not present

## 2018-08-03 DIAGNOSIS — D123 Benign neoplasm of transverse colon: Secondary | ICD-10-CM | POA: Diagnosis not present

## 2018-08-03 DIAGNOSIS — D124 Benign neoplasm of descending colon: Secondary | ICD-10-CM | POA: Diagnosis not present

## 2018-08-03 DIAGNOSIS — K2930 Chronic superficial gastritis without bleeding: Secondary | ICD-10-CM | POA: Diagnosis not present

## 2018-08-29 DIAGNOSIS — I10 Essential (primary) hypertension: Secondary | ICD-10-CM | POA: Diagnosis not present

## 2018-08-29 DIAGNOSIS — E6609 Other obesity due to excess calories: Secondary | ICD-10-CM | POA: Diagnosis not present

## 2018-08-29 DIAGNOSIS — Z6830 Body mass index (BMI) 30.0-30.9, adult: Secondary | ICD-10-CM | POA: Diagnosis not present

## 2018-09-30 DIAGNOSIS — E6609 Other obesity due to excess calories: Secondary | ICD-10-CM | POA: Diagnosis not present

## 2018-09-30 DIAGNOSIS — Z1389 Encounter for screening for other disorder: Secondary | ICD-10-CM | POA: Diagnosis not present

## 2018-09-30 DIAGNOSIS — Z6830 Body mass index (BMI) 30.0-30.9, adult: Secondary | ICD-10-CM | POA: Diagnosis not present

## 2018-09-30 DIAGNOSIS — J069 Acute upper respiratory infection, unspecified: Secondary | ICD-10-CM | POA: Diagnosis not present

## 2018-10-27 ENCOUNTER — Other Ambulatory Visit (HOSPITAL_COMMUNITY): Payer: Self-pay | Admitting: Family Medicine

## 2018-10-27 ENCOUNTER — Ambulatory Visit (HOSPITAL_COMMUNITY)
Admission: RE | Admit: 2018-10-27 | Discharge: 2018-10-27 | Disposition: A | Discharge status: Home / Self Care | Payer: PPO | Source: Ambulatory Visit | Attending: Family Medicine | Admitting: Family Medicine

## 2018-10-27 DIAGNOSIS — R059 Cough, unspecified: Secondary | ICD-10-CM

## 2018-10-27 DIAGNOSIS — R05 Cough: Secondary | ICD-10-CM | POA: Insufficient documentation

## 2018-10-27 DIAGNOSIS — Z6830 Body mass index (BMI) 30.0-30.9, adult: Secondary | ICD-10-CM | POA: Diagnosis not present

## 2018-10-27 DIAGNOSIS — M797 Fibromyalgia: Secondary | ICD-10-CM | POA: Diagnosis not present

## 2018-10-27 DIAGNOSIS — R7309 Other abnormal glucose: Secondary | ICD-10-CM | POA: Diagnosis not present

## 2018-10-27 DIAGNOSIS — G8929 Other chronic pain: Secondary | ICD-10-CM | POA: Diagnosis not present

## 2018-10-27 DIAGNOSIS — J302 Other seasonal allergic rhinitis: Secondary | ICD-10-CM | POA: Diagnosis not present

## 2018-10-27 DIAGNOSIS — I10 Essential (primary) hypertension: Secondary | ICD-10-CM | POA: Diagnosis not present

## 2018-10-27 DIAGNOSIS — E7849 Other hyperlipidemia: Secondary | ICD-10-CM | POA: Diagnosis not present

## 2018-10-27 DIAGNOSIS — I639 Cerebral infarction, unspecified: Secondary | ICD-10-CM | POA: Diagnosis not present

## 2018-10-27 DIAGNOSIS — E6609 Other obesity due to excess calories: Secondary | ICD-10-CM | POA: Diagnosis not present

## 2018-10-27 DIAGNOSIS — Z1389 Encounter for screening for other disorder: Secondary | ICD-10-CM | POA: Diagnosis not present

## 2018-10-27 DIAGNOSIS — Z0001 Encounter for general adult medical examination with abnormal findings: Secondary | ICD-10-CM | POA: Diagnosis not present

## 2018-11-03 DIAGNOSIS — M9901 Segmental and somatic dysfunction of cervical region: Secondary | ICD-10-CM | POA: Diagnosis not present

## 2018-11-03 DIAGNOSIS — M5137 Other intervertebral disc degeneration, lumbosacral region: Secondary | ICD-10-CM | POA: Diagnosis not present

## 2018-11-03 DIAGNOSIS — M9902 Segmental and somatic dysfunction of thoracic region: Secondary | ICD-10-CM | POA: Diagnosis not present

## 2018-11-03 DIAGNOSIS — M9903 Segmental and somatic dysfunction of lumbar region: Secondary | ICD-10-CM | POA: Diagnosis not present

## 2018-11-07 DIAGNOSIS — M9901 Segmental and somatic dysfunction of cervical region: Secondary | ICD-10-CM | POA: Diagnosis not present

## 2018-11-07 DIAGNOSIS — M9903 Segmental and somatic dysfunction of lumbar region: Secondary | ICD-10-CM | POA: Diagnosis not present

## 2018-11-07 DIAGNOSIS — M9902 Segmental and somatic dysfunction of thoracic region: Secondary | ICD-10-CM | POA: Diagnosis not present

## 2018-11-07 DIAGNOSIS — M5137 Other intervertebral disc degeneration, lumbosacral region: Secondary | ICD-10-CM | POA: Diagnosis not present

## 2018-11-17 DIAGNOSIS — D225 Melanocytic nevi of trunk: Secondary | ICD-10-CM | POA: Diagnosis not present

## 2018-11-17 DIAGNOSIS — L981 Factitial dermatitis: Secondary | ICD-10-CM | POA: Diagnosis not present

## 2018-11-17 DIAGNOSIS — L820 Inflamed seborrheic keratosis: Secondary | ICD-10-CM | POA: Diagnosis not present

## 2018-11-22 DIAGNOSIS — E6609 Other obesity due to excess calories: Secondary | ICD-10-CM | POA: Diagnosis not present

## 2018-11-22 DIAGNOSIS — Z6830 Body mass index (BMI) 30.0-30.9, adult: Secondary | ICD-10-CM | POA: Diagnosis not present

## 2018-11-22 DIAGNOSIS — I10 Essential (primary) hypertension: Secondary | ICD-10-CM | POA: Diagnosis not present

## 2018-12-15 ENCOUNTER — Other Ambulatory Visit: Payer: Self-pay | Admitting: Gastroenterology

## 2018-12-15 DIAGNOSIS — R1319 Other dysphagia: Secondary | ICD-10-CM

## 2018-12-15 DIAGNOSIS — K219 Gastro-esophageal reflux disease without esophagitis: Secondary | ICD-10-CM | POA: Diagnosis not present

## 2018-12-15 DIAGNOSIS — K314 Gastric diverticulum: Secondary | ICD-10-CM | POA: Diagnosis not present

## 2018-12-15 DIAGNOSIS — R1310 Dysphagia, unspecified: Secondary | ICD-10-CM

## 2018-12-16 ENCOUNTER — Ambulatory Visit
Admission: RE | Admit: 2018-12-16 | Discharge: 2018-12-16 | Disposition: A | Discharge status: Home / Self Care | Payer: PPO | Source: Ambulatory Visit | Attending: Gastroenterology | Admitting: Gastroenterology

## 2018-12-16 ENCOUNTER — Other Ambulatory Visit: Payer: Self-pay | Admitting: Gastroenterology

## 2018-12-16 DIAGNOSIS — R1310 Dysphagia, unspecified: Secondary | ICD-10-CM | POA: Diagnosis not present

## 2018-12-16 DIAGNOSIS — K314 Gastric diverticulum: Secondary | ICD-10-CM

## 2018-12-16 DIAGNOSIS — R1319 Other dysphagia: Secondary | ICD-10-CM

## 2018-12-16 DIAGNOSIS — K219 Gastro-esophageal reflux disease without esophagitis: Secondary | ICD-10-CM

## 2019-02-15 DIAGNOSIS — Z9889 Other specified postprocedural states: Secondary | ICD-10-CM | POA: Diagnosis not present

## 2019-02-15 DIAGNOSIS — K219 Gastro-esophageal reflux disease without esophagitis: Secondary | ICD-10-CM | POA: Diagnosis not present

## 2019-02-22 ENCOUNTER — Other Ambulatory Visit: Payer: Self-pay | Admitting: General Surgery

## 2019-02-22 DIAGNOSIS — Z9889 Other specified postprocedural states: Secondary | ICD-10-CM

## 2019-02-28 ENCOUNTER — Other Ambulatory Visit: Payer: Self-pay

## 2019-02-28 ENCOUNTER — Ambulatory Visit
Admission: RE | Admit: 2019-02-28 | Discharge: 2019-02-28 | Disposition: A | Discharge status: Home / Self Care | Payer: PPO | Source: Ambulatory Visit | Attending: General Surgery | Admitting: General Surgery

## 2019-02-28 DIAGNOSIS — K219 Gastro-esophageal reflux disease without esophagitis: Secondary | ICD-10-CM | POA: Diagnosis not present

## 2019-02-28 DIAGNOSIS — Z9889 Other specified postprocedural states: Secondary | ICD-10-CM

## 2019-02-28 MED ORDER — IOPAMIDOL (ISOVUE-300) INJECTION 61%
100.0000 mL | Freq: Once | INTRAVENOUS | Status: AC | PRN
  Administered 2019-02-28: 16:00:00 100 mL via INTRAVENOUS

## 2019-03-01 DIAGNOSIS — K219 Gastro-esophageal reflux disease without esophagitis: Secondary | ICD-10-CM | POA: Diagnosis not present

## 2019-03-01 DIAGNOSIS — Z9889 Other specified postprocedural states: Secondary | ICD-10-CM | POA: Diagnosis not present

## 2019-03-14 ENCOUNTER — Ambulatory Visit: Payer: Self-pay | Admitting: General Surgery

## 2019-03-14 NOTE — H&P (Signed)
Sarah [name] Documented: 03/01/2019 10:47 AM Location: [facility] Patient #: [id number] DOB: 02/28/1951 Widowed / Language: English / Race: Refused to Report/Unreported Female   History of Present Illness ( M.  MD; 03/01/2019 12:08 PM) The patient is a 68 year old female who presents for evaluation of reflux esophagitis. Patient comes in for follow-up today after being initially met virtually about 2 weeks ago. She was initially referred for a fundal diverticulum however upon talking with her she had had a prior laparoscopic Nissen fundoplication in the 1990s. On her initial imaging it appears that her fundoplication has become unwrapped. We also did not have all of her records at her initial video visit. She comes in today to meet in person as well as to review her most recent imaging as well as a CT scan that she had done yesterday. In review of her symptoms she states that nothing really has worsened over the past few months this is more or less how her symptoms have been going for the past few years. She does not eat anything after solid after 7 PM she will complain of pressure in her mid chest throughout the day in the evening. She states that she will belch of water. She states that at times it feels like food gets hung in her lower chest and this is only with solids. She reports getting fuller on a smaller amount of food-this is a little bit of a change for her or the past year or 2. In previous years she can eat chicken and dumplings but now she can't because it'll reproduce her symptoms. She takes reflux medication once a day. She has a daily bowel movement. She denies any regurgitation of solid foods. She will occasionally vomit clear fluid or phlegm. She'll have an occasional sensation of pills getting stuck. She will have some acid sensation in the middle the night. She has nocturia. She states that she thinks that she had her Nissen done at [facility] in  the mid 90s. She had an esophagram in March which showed some mild dysmotility and this fundal diverticulum but in discussing her surgical history what the reading radiologist he states that is consistent with her wrap becoming undone. She also had a CT scan yesterday which demonstrated that part of her wrap is extending up posteriorly laterally toward the spleen. No evidence of hiatal hernia on either the esophagram or CT. She lives alone. She had a remote stroke many years ago. She denies any TIAs or amaurosis.  she a BRAVO ph probe study done by Dr Mann as part of a study. I spoke with Dr Mann who is going to try to get results from that company and send to us.   02/16/19 The patient is referred by Dr Karki for evaluation of a fundal diverticulum. The patient reports a long-standing history of reflux for many years. She states that has worsened over the past year by requiring more medication usage. She states that she has reflux all the time. She has it at night as well as during the daytime. She states that sometimes water comes back up. She also states that food and liquids come back up but she states after additional questioning that she rarely regurgitates solid food. She will have some mucus and phlegm. She reports early satiety. She reports a pressure in her upper abdomen. Sometimes it feels like food gets stuck. She does not eat anything solid after 7 PM. She will occasionally wake up at   night with heartburn. She takes Prilosec every morning and then if she is having a bad day she will take a second dose in the afternoon. She reportedly had an upper endoscopy by the referring gastroenterologist in October which I don't have a copy of. She ended up seeing Dr. Mann as part of some study apparently. I don't have any office notes just an endoscopy report. It appears that she probably had what appears to be an esophageal probe placed for pH monitoring. In this report there was  mention of a medium-sized diverticulum in the fundus. The probably probe was removed by snare polypectomy. She then had an esophagram March 13 that showed nonspecific motility disorder, patulous GE junction with a fundal diverticulum as well as the fact that a 13 mm barium tablet passed without issue. She did have the bravo probe in during this esophagram.  When I ask her about her prior abdominal surgeries she states that she has had a cholecystectomy and that she had some type of heartburn or reflux surgery in the 90s. She states that they wrapped something around her esophagus. She states that she has multiple tiny incisions in the upper abdomen. When I ask if she had her that her Nissen fundoplication she states that that sounds like what she was told that they did   Denies chest pain, chest tightness, shortness of breath, orthopnea. Some dyspnea on exertion. No TIAs or amaurosis fugax.    Problem List/Past Medical ( M. , MD; 03/01/2019 12:14 PM) S/P LAPAROSCOPIC FUNDOPLICATION (Z98.890)  1990s  Past Surgical History ( M. , MD; 03/01/2019 12:14 PM) Breast Biopsy  Bilateral. Foot Surgery  Bilateral. Gallbladder Surgery - Open   Diagnostic Studies History ( M. , MD; 03/01/2019 12:14 PM) Colonoscopy  within last year Mammogram  within last year Pap Smear  1-5 years ago  Allergies (Kelsey Phillips, CMA; 03/01/2019 10:48 AM) Penicillins  Allergies Reconciled   Medication History (Kelsey Phillips, CMA; 03/01/2019 10:48 AM) Prevacid (15MG Capsule DR, Oral) Active. Estradiol (1MG Tablet, Oral) Active. hydroCHLOROthiazide (25MG Tablet, Oral) Active. ALPRAZolam (0.25MG Tablet, Oral) Active. Medications Reconciled  Social History ( M. , MD; 03/01/2019 12:14 PM) Caffeine use  Coffee. No drug use  Tobacco use  Never smoker.  Family History ( M. , MD; 03/01/2019 12:14 PM) Arthritis  Mother. Diabetes Mellitus   Mother. Hypertension  Mother.  Pregnancy / Birth History ( M. , MD; 03/01/2019 12:14 PM) Age of menopause  <47 Gravida  1 Maternal age  26-30 Para  1  Other Problems ( M. , MD; 03/01/2019 12:14 PM) Arthritis  Back Pain  GASTROESOPHAGEAL REFLUX DISEASE WITHOUT ESOPHAGITIS (K21.9)     Review of Systems ( M.  MD; 03/01/2019 12:02 PM) General Present- Weight Gain. Not Present- Appetite Loss, Chills, Fatigue, Fever, Night Sweats and Weight Loss. Skin Not Present- Change in Wart/Mole, Dryness, Hives, Jaundice, New Lesions, Non-Healing Wounds, Rash and Ulcer. HEENT Present- Seasonal Allergies. Not Present- Earache, Hearing Loss, Hoarseness, Nose Bleed, Oral Ulcers, Ringing in the Ears, Sinus Pain, Sore Throat, Visual Disturbances, Wears glasses/contact lenses and Yellow Eyes. Respiratory Not Present- Bloody sputum, Chronic Cough, Difficulty Breathing, Snoring and Wheezing. Breast Not Present- Breast Mass, Breast Pain, Nipple Discharge and Skin Changes. Cardiovascular Not Present- Chest Pain, Difficulty Breathing Lying Down, Leg Cramps, Palpitations, Rapid Heart Rate, Shortness of Breath and Swelling of Extremities. Gastrointestinal Not Present- Abdominal Pain, Bloating, Bloody Stool, Change in Bowel Habits, Chronic diarrhea, Constipation, Difficulty Swallowing, Excessive gas, Gets full quickly at meals, Hemorrhoids,   Indigestion, Nausea, Rectal Pain and Vomiting. Female Genitourinary Not Present- Frequency, Nocturia, Painful Urination, Pelvic Pain and Urgency. Musculoskeletal Present- Back Pain. Not Present- Joint Pain, Joint Stiffness, Muscle Pain, Muscle Weakness and Swelling of Extremities. Neurological Present- Headaches. Not Present- Decreased Memory, Fainting, Numbness, Seizures, Tingling, Tremor, Trouble walking and Weakness. Psychiatric Not Present- Anxiety, Bipolar, Change in Sleep Pattern, Depression, Fearful and Frequent crying. Endocrine Present- Hot  flashes. Not Present- Cold Intolerance, Excessive Hunger, Hair Changes, Heat Intolerance and New Diabetes. Hematology Not Present- Blood Thinners, Easy Bruising, Excessive bleeding, Gland problems, HIV and Persistent Infections.  Vitals (Kelsey Phillips CMA; 03/01/2019 10:48 AM) 03/01/2019 10:47 AM Weight: 164.6 lb Height: 62in Body Surface Area: 1.76 m Body Mass Index: 30.11 kg/m  Temp.: 98.3F  Pulse: 114 (Regular)  BP: 116/82 (Sitting, Left Arm, Standard)       Physical Exam ( M.  MD; 03/01/2019 12:02 PM) General Mental Status-Alert. General Appearance-Consistent with stated age. Hydration-Well hydrated. Voice-Normal.  Head and Neck Head-normocephalic, atraumatic with no lesions or palpable masses. Trachea-midline. Thyroid Gland Characteristics - normal size and consistency.  Eye Eyeball - Bilateral-Normal. Sclera/Conjunctiva - Bilateral-No scleral icterus.  ENMT Ears Pinna - Bilateral - no bony growth in lateral aspect of ear canal, no edema. Nose and Sinuses External Inspection of the Nose - symmetric, no deformities observed. Mouth and Throat Lips - Upper Lip - Note: normal lips.  Chest and Lung Exam Chest and lung exam reveals -quiet, even and easy respiratory effort with no use of accessory muscles and on auscultation, normal breath sounds, no adventitious sounds and normal vocal resonance. Inspection Chest Wall - Normal. Back - normal.  Breast - Did not examine.  Cardiovascular Cardiovascular examination reveals -normal heart sounds, regular rate and rhythm with no murmurs and normal pedal pulses bilaterally.  Abdomen Inspection Inspection of the abdomen reveals - No Hernias. Skin - Scar - Note: well healed trocar scars; lower midline scar. Palpation/Percussion Palpation and Percussion of the abdomen reveal - Soft, Non Tender, No Rebound tenderness, No Rigidity (guarding) and No  hepatosplenomegaly. Auscultation Auscultation of the abdomen reveals - Bowel sounds normal.  Peripheral Vascular Upper Extremity Palpation - Pulses bilaterally normal.  Neurologic Neurologic evaluation reveals -alert and oriented x 3 with no impairment of recent or remote memory. Mental Status-Normal.  Neuropsychiatric The patient's mood and affect are described as -normal. Judgment and Insight-insight is appropriate concerning matters relevant to self.  Musculoskeletal Normal Exam - Left-Upper Extremity Strength Normal and Lower Extremity Strength Normal. Normal Exam - Right-Upper Extremity Strength Normal and Lower Extremity Strength Normal.  Lymphatic Head & Neck  General Head & Neck Lymphatics: Bilateral - Description - Normal. Axillary - Did not examine. Femoral & Inguinal - Did not examine.    Assessment & Plan ( M.  MD; 03/01/2019 12:14 PM) GASTROESOPHAGEAL REFLUX DISEASE WITHOUT ESOPHAGITIS (K21.9) Impression: It appears her Nissen fundoplication has become undone. Her wrap is no longer intact. On CT imaging there is evidence of contrast and part of the wrap extending posterior lateral toward the spleen. I'm not sure she is getting some discomfort from having fluid filll that part of the wrap as well as the fact that it was endoscopically visible on 2 endoscopies where it appeared to be a blind diverticulum. We reviewed her imaging studies. Using diagrams I showed her what a typical Nissen fundoplication looks like as well as but hers currently looks like. We discussed ongoing medical management versus surgical intervention. She is already using correct eating techniques and behaviors.   With respect to surgical intervention we discussed laparoscopic redo Nissen fundoplication. I explained that we would attempt to restore the stomach to normal anatomical position and then rewrap the stomach around the distal esophagus in a loose wrap. We discussed risk of  surgery including but not limited to bleeding, infection, injury to surrounding structures, blood clot formation, perioperative cardiac and pulmonary events, postoperative fundoplication issues such as gas bloat, dysphagia, ongoing reflux, inability to vomit, nausea etc. We discussed operating during ongoing coronavirus pandemic. We discussed that should she contract coronavirus perioperatively her risk for complications specifically pulmonary as well as mortality is increased. We also discussed that she is at higher risk for injury to surrounding structures as well as bleeding because of prior foregut surgery. I advised her that I wanted her to think about next steps as well as to see if she has any additional questions. I have asked her to call us in a week or 2 should she have any additional questions as well as to let us know how she would like to proceed Current Plans The patient was instructed to call back in 2 weeks with progress S/P LAPAROSCOPIC FUNDOPLICATION (Z98.890) Story: 1990s Impression: In speaking with the patient it appears she has had a prior laparoscopic fundoplication. My guess is that of fundal diverticulum that is seen on esophagram and upper endoscopy is actually part of her wrap. appears her wrap has become undone when look at all the imaging. See above   M. , MD, FACS General, Bariatric, & Minimally Invasive Surgery [facility]  

## 2019-03-14 NOTE — H&P (View-Only) (Signed)
Sarah [name] Documented: 03/01/2019 10:47 AM Location: [facility] Patient #: [id number] DOB: Oct 17, 1950 Widowed / Language: Sarah [name] / Race: Refused to Report/Unreported Female   History of Present Illness Sarah Hiss M.  MD; 03/01/2019 12:08 PM) The patient is a 68 year old female who presents for evaluation of reflux esophagitis. Patient comes in for follow-up today after being initially met virtually about 2 weeks ago. She was initially referred for a fundal diverticulum however upon talking with her she had had a prior laparoscopic Nissen fundoplication in the 0932T. On her initial imaging it appears that her fundoplication has become unwrapped. We also did not have all of her records at her initial video visit. She comes in today to meet in person as well as to review her most recent imaging as well as a CT scan that she had done yesterday. In review of her symptoms she states that nothing really has worsened over the past few months this is more or less how her symptoms have been going for the past few years. She does not eat anything after solid after 7 PM she will complain of pressure in her mid chest throughout the day in the evening. She states that she will belch of water. She states that at times it feels like food gets hung in her lower chest and this is only with solids. She reports getting fuller on a smaller amount of food-this is a little bit of a change for her or the past year or 2. In previous years she can eat chicken and dumplings but now she can't because it'll reproduce her symptoms. She takes reflux medication once a day. She has a daily bowel movement. She denies any regurgitation of solid foods. She will occasionally vomit clear fluid or phlegm. She'll have an occasional sensation of pills getting stuck. She will have some acid sensation in the middle the night. She has nocturia. She states that she thinks that she had her Nissen done at [facility] in  the mid 90s. She had an esophagram in March which showed some mild dysmotility and this fundal diverticulum but in discussing her surgical history what the reading radiologist he states that is consistent with her wrap becoming undone. She also had a CT scan yesterday which demonstrated that part of her wrap is extending up posteriorly laterally toward the spleen. No evidence of hiatal hernia on either the esophagram or CT. She lives alone. She had a remote stroke many years ago. She denies any TIAs or amaurosis.  she a BRAVO ph probe study done by Dr Collene Mares as part of a study. I spoke with Dr Collene Mares who is going to try to get results from that company and send to [place].   02/16/19 The patient is referred by Dr Therisa Doyne for evaluation of a fundal diverticulum. The patient reports a long-standing history of reflux for many years. She states that has worsened over the past year by requiring more medication usage. She states that she has reflux all the time. She has it at night as well as during the daytime. She states that sometimes water comes back up. She also states that food and liquids come back up but she states after additional questioning that she rarely regurgitates solid food. She will have some mucus and phlegm. She reports early satiety. She reports a pressure in her upper abdomen. Sometimes it feels like food gets stuck. She does not eat anything solid after 7 PM. She will occasionally wake up at  night with heartburn. She takes Prilosec every morning and then if she is having a bad day she will take a second dose in the afternoon. She reportedly had an upper endoscopy by the referring gastroenterologist in October which I don't have a copy of. She ended up seeing Dr. Collene Mares as part of some study apparently. I don't have any office notes just an endoscopy report. It appears that she probably had what appears to be an esophageal probe placed for pH monitoring. In this report there was  mention of a medium-sized diverticulum in the fundus. The probably probe was removed by snare polypectomy. She then had an esophagram March 13 that showed nonspecific motility disorder, patulous GE junction with a fundal diverticulum as well as the fact that a 13 mm barium tablet passed without issue. She did have the bravo probe in during this esophagram.  When I ask her about her prior abdominal surgeries she states that she has had a cholecystectomy and that she had some type of heartburn or reflux surgery in the 90s. She states that they wrapped something around her esophagus. She states that she has multiple tiny incisions in the upper abdomen. When I ask if she had her that her Nissen fundoplication she states that that sounds like what she was told that they did   Denies chest pain, chest tightness, shortness of breath, orthopnea. Some dyspnea on exertion. No TIAs or amaurosis fugax.    Problem List/Past Medical Sarah Hiss M. Redmond Pulling, MD; 03/01/2019 12:14 PM) S/P LAPAROSCOPIC FUNDOPLICATION (Z61.096)  [id number]  Past Surgical History Sarah Hiss M. Redmond Pulling, MD; 03/01/2019 12:14 PM) Breast Biopsy  Bilateral. Foot Surgery  Bilateral. Gallbladder Surgery - Open   Diagnostic Studies History Sarah Hiss M. Redmond Pulling, MD; 03/01/2019 12:14 PM) Colonoscopy  within last year Mammogram  within last year Pap Smear  1-5 years ago  Allergies Emeline Gins, [place]; 03/01/2019 10:48 AM) Penicillins  Allergies Reconciled   Medication History Emeline Gins, CMA; 03/01/2019 10:48 AM) Prevacid (15MG Capsule DR, Oral) Active. Estradiol (1MG Tablet, Oral) Active. hydroCHLOROthiazide (25MG Tablet, Oral) Active. ALPRAZolam (0.25MG Tablet, Oral) Active. Medications Reconciled  Social History Sarah Hiss M. Redmond Pulling, MD; 03/01/2019 12:14 PM) Caffeine use  Coffee. No drug use  Tobacco use  Never smoker.  Family History Sarah Hiss M. Redmond Pulling, MD; 03/01/2019 12:14 PM) Arthritis  Mother. Diabetes Mellitus   Mother. Hypertension  Mother.  Pregnancy / Birth History Sarah Hiss M. Redmond Pulling, MD; 03/01/2019 12:14 PM) Age of menopause  <45 Gravida  1 Maternal age  41-30 Para  1  Other Problems Leighton Ruff. Redmond Pulling, MD; 03/01/2019 12:14 PM) Arthritis  Back Pain  GASTROESOPHAGEAL REFLUX DISEASE WITHOUT ESOPHAGITIS (K21.9)     Review of Systems Sarah Hiss M.  MD; 03/01/2019 12:02 PM) General Present- Weight Gain. Not Present- Appetite Loss, Chills, Fatigue, Fever, Night Sweats and Weight Loss. Skin Not Present- Change in Wart/Mole, Dryness, Hives, Jaundice, New Lesions, Non-Healing Wounds, Rash and Ulcer. HEENT Present- Seasonal Allergies. Not Present- Earache, Hearing Loss, Hoarseness, Nose Bleed, Oral Ulcers, Ringing in the Ears, Sinus Pain, Sore Throat, Visual Disturbances, Wears glasses/contact lenses and Yellow Eyes. Respiratory Not Present- Bloody sputum, Chronic Cough, Difficulty Breathing, Snoring and Wheezing. Breast Not Present- Breast Mass, Breast Pain, Nipple Discharge and Skin Changes. Cardiovascular Not Present- Chest Pain, Difficulty Breathing Lying Down, Leg Cramps, Palpitations, Rapid Heart Rate, Shortness of Breath and Swelling of Extremities. Gastrointestinal Not Present- Abdominal Pain, Bloating, Bloody Stool, Change in Bowel Habits, Chronic diarrhea, Constipation, Difficulty Swallowing, Excessive gas, Gets full quickly at meals, Hemorrhoids,  Indigestion, Nausea, Rectal Pain and Vomiting. Female Genitourinary Not Present- Frequency, Nocturia, Painful Urination, Pelvic Pain and Urgency. Musculoskeletal Present- Back Pain. Not Present- Joint Pain, Joint Stiffness, Muscle Pain, Muscle Weakness and Swelling of Extremities. Neurological Present- Headaches. Not Present- Decreased Memory, Fainting, Numbness, Seizures, Tingling, Tremor, Trouble walking and Weakness. Psychiatric Not Present- Anxiety, Bipolar, Change in Sleep Pattern, Depression, Fearful and Frequent crying. Endocrine Present- Hot  flashes. Not Present- Cold Intolerance, Excessive Hunger, Hair Changes, Heat Intolerance and New Diabetes. Hematology Not Present- Blood Thinners, Easy Bruising, Excessive bleeding, Gland problems, HIV and Persistent Infections.  Vitals Emeline Gins CMA; 03/01/2019 10:48 AM) 03/01/2019 10:47 AM Weight: 164.6 lb Height: 62in Body Surface Area: 1.76 m Body Mass Index: 30.11 kg/m  Temp.: 98.46F  Pulse: 114 (Regular)  BP: 116/82 (Sitting, Left Arm, Standard)       Physical Exam Sarah Hiss M.  MD; 03/01/2019 12:02 PM) General Mental Status-Alert. General Appearance-Consistent with stated age. Hydration-Well hydrated. Voice-Normal.  Head and Neck Head-normocephalic, atraumatic with no lesions or palpable masses. Trachea-midline. Thyroid Gland Characteristics - normal size and consistency.  Eye Eyeball - Bilateral-Normal. Sclera/Conjunctiva - Bilateral-No scleral icterus.  ENMT Ears Pinna - Bilateral - no bony growth in lateral aspect of ear canal, no edema. Nose and Sinuses External Inspection of the Nose - symmetric, no deformities observed. Mouth and Throat Lips - Upper Lip - Note: normal lips.  Chest and Lung Exam Chest and lung exam reveals -quiet, even and easy respiratory effort with no use of accessory muscles and on auscultation, normal breath sounds, no adventitious sounds and normal vocal resonance. Inspection Chest Wall - Normal. Back - normal.  Breast - Did not examine.  Cardiovascular Cardiovascular examination reveals -normal heart sounds, regular rate and rhythm with no murmurs and normal pedal pulses bilaterally.  Abdomen Inspection Inspection of the abdomen reveals - No Hernias. Skin - Scar - Note: well healed trocar scars; lower midline scar. Palpation/Percussion Palpation and Percussion of the abdomen reveal - Soft, Non Tender, No Rebound tenderness, No Rigidity (guarding) and No  hepatosplenomegaly. Auscultation Auscultation of the abdomen reveals - Bowel sounds normal.  Peripheral Vascular Upper Extremity Palpation - Pulses bilaterally normal.  Neurologic Neurologic evaluation reveals -alert and oriented x 3 with no impairment of recent or remote memory. Mental Status-Normal.  Neuropsychiatric The patient's mood and affect are described as -normal. Judgment and Insight-insight is appropriate concerning matters relevant to self.  Musculoskeletal Normal Exam - Left-Upper Extremity Strength Normal and Lower Extremity Strength Normal. Normal Exam - Right-Upper Extremity Strength Normal and Lower Extremity Strength Normal.  Lymphatic Head & Neck  General Head & Neck Lymphatics: Bilateral - Description - Normal. Axillary - Did not examine. Femoral & Inguinal - Did not examine.    Assessment & Plan Sarah Hiss M.  MD; 03/01/2019 12:14 PM) GASTROESOPHAGEAL REFLUX DISEASE WITHOUT ESOPHAGITIS (K21.9) Impression: It appears her Nissen fundoplication has become undone. Her wrap is no longer intact. On CT imaging there is evidence of contrast and part of the wrap extending posterior lateral toward the spleen. I'm not sure she is getting some discomfort from having fluid filll that part of the wrap as well as the fact that it was endoscopically visible on 2 endoscopies where it appeared to be a blind diverticulum. We reviewed her imaging studies. Using diagrams I showed her what a typical Nissen fundoplication looks like as well as but hers currently looks like. We discussed ongoing medical management versus surgical intervention. She is already using correct eating techniques and behaviors.  With respect to surgical intervention we discussed laparoscopic redo Nissen fundoplication. I explained that we would attempt to restore the stomach to normal anatomical position and then rewrap the stomach around the distal esophagus in a loose wrap. We discussed risk of  surgery including but not limited to bleeding, infection, injury to surrounding structures, blood clot formation, perioperative cardiac and pulmonary events, postoperative fundoplication issues such as gas bloat, dysphagia, ongoing reflux, inability to vomit, nausea etc. We discussed operating during ongoing coronavirus pandemic. We discussed that should she contract coronavirus perioperatively her risk for complications specifically pulmonary as well as mortality is increased. We also discussed that she is at higher risk for injury to surrounding structures as well as bleeding because of prior foregut surgery. I advised her that I wanted her to think about next steps as well as to see if she has any additional questions. I have asked her to call us in a week or 2 should she have any additional questions as well as to let us know how she would like to proceed Current Plans The patient was instructed to call back in 2 weeks with progress S/P LAPAROSCOPIC FUNDOPLICATION (P36.681) Story: 1990s Impression: In speaking with the patient it appears she has had a prior laparoscopic fundoplication. My guess is that of fundal diverticulum that is seen on esophagram and upper endoscopy is actually part of her wrap. appears her wrap has become undone when look at all the imaging. See above  Leighton Ruff. Redmond Pulling, MD, FACS General, Bariatric, & Minimally Invasive Surgery [facility], [place]

## 2019-03-22 NOTE — Progress Notes (Signed)
CHEST XRAY 10-27-18 E[OC ECHO 01-31-16 EPIC

## 2019-03-22 NOTE — Patient Instructions (Addendum)
YOU ARE REQUIRED TO BE TESTED FOR COVID-19 PRIOR TO YOUR SURGERY . YOUR TEST MUST BE COMPLETED ON Thursday June18th. TESTING IS LOCATED AT [place] . FAILURE TO COMPLETE TESTING MAY RESULT IN CANCELLATION OF YOUR SURGERY.  ONCE YOUR COVID TEST IS COMPLETED, PLEASE BEGIN THE QUARANTINE INSTRUCTIONS AS OUTLINED IN YOUR HANDOUT.                 Sarah [name]       Your procedure is scheduled on: 03-27-2019    Report to [facility] Main  Entrance    Report to [place] at 530 AM      Call this number if you have problems the morning of surgery [phone]    Remember: . BRUSH YOUR TEETH MORNING OF SURGERY AND RINSE YOUR MOUTH OUT, NO CHEWING GUM CANDY OR MINTS.    Do not eat food After Midnight. YOU MAY HAVE CLEAR LIQUIDS FROM MIDNIGHT UNTIL 4:30AM. At 4:30AM Please finish the prescribed Pre-Surgery ENSURE drink. Nothing by mouth after you finish the ENSURE drink !    CLEAR LIQUID DIET   Foods Allowed                                                                     Foods Excluded  Coffee and tea, regular and decaf                             liquids that you cannot  Plain Jell-O in any flavor                                             see through such as: Fruit ices (not with fruit pulp)                                     milk, soups, orange juice  Iced Popsicles                                    All solid food Carbonated beverages, regular and diet                                    Cranberry, grape and apple juices Sports drinks like Gatorade Lightly seasoned clear broth or consume(fat free) Sugar, honey syrup  Sample Menu Breakfast                                Lunch                                     Supper Cranberry juice  Beef broth                            Chicken broth Jell-O                                     Grape juice                           Apple juice Coffee or tea                         Jell-O                                      Popsicle                                                Coffee or tea                        Coffee or tea  _____________________________________________________________________     Take these medicines the morning of surgery with A SIP OF WATER: ESTRADIOL (ESTRACE), NEXIUM                                 You may not have any metal on your body including hair pins and              piercings  Do not wear jewelry, make-up, lotions, powders or perfumes, deodorant             Do not wear nail polish.  Do not shave  48 hours prior to surgery.                Do not bring valuables to the hospital. [place].  Contacts, dentures or bridgework may not be worn into surgery.  Leave suitcase in the car. After surgery it may be brought to your room.                  Please read over the following fact sheets you were given: _____________________________________________________________________             [facility] - Preparing for Surgery Before surgery, you can play an important role.  Because skin is not sterile, your skin needs to be as free of germs as possible.  You can reduce the number of germs on your skin by washing with CHG (chlorahexidine gluconate) soap before surgery.  CHG is an antiseptic cleaner which kills germs and bonds with the skin to continue killing germs even after washing. Please DO NOT use if you have an allergy to CHG or antibacterial soaps.  If your skin becomes reddened/irritated stop using the CHG and inform your nurse when you arrive at Short Stay. Do not shave (including legs and underarms) for at least 48 hours prior to the first CHG shower.  You may shave your face/neck. Please follow these  instructions carefully:  1.  Shower with CHG Soap the night before surgery and the  morning of Surgery.  2.  If you choose to wash your hair, wash your hair first as usual  with your  normal  shampoo.  3.  After you shampoo, rinse your hair and body thoroughly to remove the  shampoo.                           4.  Use CHG as you would any other liquid soap.  You can apply chg directly  to the skin and wash                       Gently with a scrungie or clean washcloth.  5.  Apply the CHG Soap to your body ONLY FROM THE NECK DOWN.   Do not use on face/ open                           Wound or open sores. Avoid contact with eyes, ears mouth and genitals (private parts).                       Wash face,  Genitals (private parts) with your normal soap.             6.  Wash thoroughly, paying special attention to the area where your surgery  will be performed.  7.  Thoroughly rinse your body with warm water from the neck down.  8.  DO NOT shower/wash with your normal soap after using and rinsing off  the CHG Soap.                9.  Pat yourself dry with a clean towel.            10.  Wear clean pajamas.            11.  Place clean sheets on your bed the night of your first shower and do not  sleep with pets. Day of Surgery : Do not apply any lotions/deodorants the morning of surgery.  Please wear clean clothes to the hospital/surgery center.  FAILURE TO FOLLOW THESE INSTRUCTIONS MAY RESULT IN THE CANCELLATION OF YOUR SURGERY PATIENT SIGNATURE_________________________________  NURSE SIGNATURE__________________________________  ________________________________________________________________________

## 2019-03-23 ENCOUNTER — Encounter (HOSPITAL_COMMUNITY): Payer: Self-pay

## 2019-03-23 ENCOUNTER — Other Ambulatory Visit: Payer: Self-pay

## 2019-03-23 ENCOUNTER — Encounter (HOSPITAL_COMMUNITY)
Admission: RE | Admit: 2019-03-23 | Discharge: 2019-03-23 | Disposition: A | Discharge status: Home / Self Care | Payer: PPO | Source: Ambulatory Visit | Attending: General Surgery | Admitting: General Surgery

## 2019-03-23 ENCOUNTER — Other Ambulatory Visit (HOSPITAL_COMMUNITY)
Admission: RE | Admit: 2019-03-23 | Discharge: 2019-03-23 | Disposition: A | Discharge status: Home / Self Care | Payer: PPO | Source: Ambulatory Visit | Attending: General Surgery | Admitting: General Surgery

## 2019-03-23 DIAGNOSIS — I498 Other specified cardiac arrhythmias: Secondary | ICD-10-CM | POA: Insufficient documentation

## 2019-03-23 DIAGNOSIS — K219 Gastro-esophageal reflux disease without esophagitis: Secondary | ICD-10-CM | POA: Diagnosis not present

## 2019-03-23 DIAGNOSIS — R9431 Abnormal electrocardiogram [ECG] [EKG]: Secondary | ICD-10-CM | POA: Diagnosis not present

## 2019-03-23 DIAGNOSIS — Z01818 Encounter for other preprocedural examination: Secondary | ICD-10-CM | POA: Diagnosis not present

## 2019-03-23 HISTORY — DX: Prediabetes: R73.03

## 2019-03-23 HISTORY — DX: Cerebral infarction, unspecified: I63.9

## 2019-03-23 HISTORY — DX: Low back pain, unspecified: M54.50

## 2019-03-23 HISTORY — DX: Unspecified disorder of nose and nasal sinuses: J34.9

## 2019-03-23 HISTORY — DX: Migraine, unspecified, not intractable, without status migrainosus: G43.909

## 2019-03-23 HISTORY — DX: Cardiac arrhythmia, unspecified: I49.9

## 2019-03-23 LAB — COMPREHENSIVE METABOLIC PANEL
ALT: 26 U/L (ref 0–44)
AST: 28 U/L (ref 15–41)
Albumin: 4.1 g/dL (ref 3.5–5.0)
Alkaline Phosphatase: 63 U/L (ref 38–126)
Anion gap: 11 (ref 5–15)
BUN: 20 mg/dL (ref 8–23)
CO2: 24 mmol/L (ref 22–32)
Calcium: 9.3 mg/dL (ref 8.9–10.3)
Chloride: 105 mmol/L (ref 98–111)
Creatinine, Ser: 0.72 mg/dL (ref 0.44–1.00)
GFR calc Af Amer: >60 mL/min (ref >60)
GFR calc non Af Amer: >60 mL/min (ref >60)
Glucose, Bld: 96 mg/dL (ref 70–99)
Potassium: 3.5 mmol/L (ref 3.5–5.1)
Sodium: 140 mmol/L (ref 135–145)
Total Bilirubin: 0.6 mg/dL (ref 0.3–1.2)
Total Protein: 7.4 g/dL (ref 6.5–8.1)

## 2019-03-23 LAB — CBC WITH DIFFERENTIAL/PLATELET
Abs Immature Granulocytes: 0.04 10*3/uL (ref 0.00–0.07)
Basophils Absolute: 0.1 10*3/uL (ref 0.0–0.1)
Basophils Relative: 1 %
Eosinophils Absolute: 0.1 10*3/uL (ref 0.0–0.5)
Eosinophils Relative: 2 %
HCT: 39.2 % (ref 36.0–46.0)
Hemoglobin: 12.8 g/dL (ref 12.0–15.0)
Immature Granulocytes: 1 %
Lymphocytes Relative: 30 %
Lymphs Abs: 2.6 10*3/uL (ref 0.7–4.0)
MCH: 29.7 pg (ref 26.0–34.0)
MCHC: 32.7 g/dL (ref 30.0–36.0)
MCV: 91 fL (ref 80.0–100.0)
Monocytes Absolute: 0.4 10*3/uL (ref 0.1–1.0)
Monocytes Relative: 5 %
Neutro Abs: 5.6 10*3/uL (ref 1.7–7.7)
Neutrophils Relative %: 61 %
Platelets: 237 10*3/uL (ref 150–400)
RBC: 4.31 MIL/uL (ref 3.87–5.11)
RDW: 13 % (ref 11.5–15.5)
WBC: 8.9 10*3/uL (ref 4.0–10.5)
nRBC: 0 % (ref 0.0–0.2)

## 2019-03-23 LAB — SARS CORONAVIRUS 2 (TAT 6-24 HRS): SARS Coronavirus 2: NEGATIVE

## 2019-03-23 LAB — HEMOGLOBIN A1C
Hgb A1c MFr Bld: 6 % — ABNORMAL HIGH (ref 4.8–5.6)
Mean Plasma Glucose: 125.5 mg/dL

## 2019-03-23 LAB — ABO/RH: ABO/RH(D): A POS

## 2019-03-23 MED ORDER — ENSURE PRE-SURGERY PO LIQD
296.0000 mL | Freq: Once | ORAL | Status: DC
  Filled 2019-03-23: qty 296

## 2019-03-24 ENCOUNTER — Other Ambulatory Visit: Payer: Self-pay | Admitting: Obstetrics and Gynecology

## 2019-03-24 DIAGNOSIS — Z01818 Encounter for other preprocedural examination: Secondary | ICD-10-CM | POA: Diagnosis not present

## 2019-03-26 MED ORDER — GENTAMICIN SULFATE 40 MG/ML IJ SOLN
5.0000 mg/kg | INTRAVENOUS | Status: AC
  Administered 2019-03-27: 300 mg via INTRAVENOUS
  Filled 2019-03-26: qty 7.5

## 2019-03-26 MED ORDER — BUPIVACAINE LIPOSOME 1.3 % IJ SUSP
20.0000 mL | Freq: Once | INTRAMUSCULAR | Status: DC
  Filled 2019-03-26: qty 20

## 2019-03-26 MED ORDER — CLINDAMYCIN PHOSPHATE 900 MG/50ML IV SOLN
900.0000 mg | INTRAVENOUS | Status: AC
  Administered 2019-03-27: 900 mg via INTRAVENOUS
  Filled 2019-03-26: qty 50

## 2019-03-26 NOTE — Anesthesia Preprocedure Evaluation (Addendum)
Anesthesia Evaluation  Patient identified by MRN, date of birth, ID band Patient awake    Reviewed: Allergy & Precautions, H&P , NPO status , Patient's Chart, lab work & pertinent test results  Airway Mallampati: I  TM Distance: >3 FB Neck ROM: Full    Dental no notable dental hx. (+) Teeth Intact, Dental Advisory Given SMALL MOUTH:   Pulmonary neg pulmonary ROS,    Pulmonary exam normal breath sounds clear to auscultation       Cardiovascular Exercise Tolerance: Good hypertension, Pt. on medications negative cardio ROS Normal cardiovascular exam+ dysrhythmias  Rhythm:Regular Rate:Normal     Neuro/Psych  Headaches,  Neuromuscular disease CVA, Residual Symptoms negative neurological ROS  negative psych ROS   GI/Hepatic negative GI ROS, Neg liver ROS, GERD  Medicated,  Endo/Other  negative endocrine ROS  Renal/GU negative Renal ROS  negative genitourinary   Musculoskeletal negative musculoskeletal ROS (+) Arthritis , Osteoarthritis,  Fibromyalgia -  Abdominal   Peds negative pediatric ROS (+)  Hematology negative hematology ROS (+)   Anesthesia Other Findings   Reproductive/Obstetrics negative OB ROS                            Anesthesia Physical Anesthesia Plan  ASA: III  Anesthesia Plan: General   Post-op Pain Management:    Induction: Intravenous  PONV Risk Score and Plan: 3 and Ondansetron, Dexamethasone and Treatment may vary due to age or medical condition  Airway Management Planned: Oral ETT  Additional Equipment:   Intra-op Plan:   Post-operative Plan: Extubation in OR  Informed Consent: I have reviewed the patients History and Physical, chart, labs and discussed the procedure including the risks, benefits and alternatives for the proposed anesthesia with the patient or authorized representative who has indicated his/her understanding and acceptance.       Plan  Discussed with: Anesthesiologist, CRNA and Surgeon  Anesthesia Plan Comments: (  )       Anesthesia Quick Evaluation

## 2019-03-27 ENCOUNTER — Encounter (HOSPITAL_COMMUNITY)
Admission: RE | Disposition: A | Discharge status: Home / Self Care | Payer: Self-pay | Source: Home / Self Care | Attending: General Surgery

## 2019-03-27 ENCOUNTER — Ambulatory Visit (HOSPITAL_COMMUNITY): Payer: PPO | Admitting: Physician Assistant

## 2019-03-27 ENCOUNTER — Encounter (HOSPITAL_COMMUNITY): Payer: Self-pay

## 2019-03-27 ENCOUNTER — Observation Stay (HOSPITAL_COMMUNITY)
Admission: RE | Admit: 2019-03-27 | Discharge: 2019-03-28 | Disposition: A | Discharge status: Home / Self Care | Payer: PPO | Attending: General Surgery | Admitting: General Surgery

## 2019-03-27 ENCOUNTER — Ambulatory Visit (HOSPITAL_COMMUNITY): Payer: PPO | Admitting: Anesthesiology

## 2019-03-27 ENCOUNTER — Other Ambulatory Visit: Payer: Self-pay

## 2019-03-27 DIAGNOSIS — Z8673 Personal history of transient ischemic attack (TIA), and cerebral infarction without residual deficits: Secondary | ICD-10-CM | POA: Diagnosis not present

## 2019-03-27 DIAGNOSIS — K9189 Other postprocedural complications and disorders of digestive system: Principal | ICD-10-CM | POA: Insufficient documentation

## 2019-03-27 DIAGNOSIS — K219 Gastro-esophageal reflux disease without esophagitis: Secondary | ICD-10-CM | POA: Insufficient documentation

## 2019-03-27 DIAGNOSIS — K449 Diaphragmatic hernia without obstruction or gangrene: Secondary | ICD-10-CM | POA: Diagnosis not present

## 2019-03-27 DIAGNOSIS — I10 Essential (primary) hypertension: Secondary | ICD-10-CM | POA: Insufficient documentation

## 2019-03-27 DIAGNOSIS — Z79899 Other long term (current) drug therapy: Secondary | ICD-10-CM | POA: Diagnosis not present

## 2019-03-27 DIAGNOSIS — Y838 Other surgical procedures as the cause of abnormal reaction of the patient, or of later complication, without mention of misadventure at the time of the procedure: Secondary | ICD-10-CM | POA: Diagnosis not present

## 2019-03-27 DIAGNOSIS — Z79890 Hormone replacement therapy (postmenopausal): Secondary | ICD-10-CM | POA: Insufficient documentation

## 2019-03-27 DIAGNOSIS — Z9889 Other specified postprocedural states: Secondary | ICD-10-CM | POA: Diagnosis present

## 2019-03-27 DIAGNOSIS — I639 Cerebral infarction, unspecified: Secondary | ICD-10-CM | POA: Diagnosis not present

## 2019-03-27 HISTORY — PX: LAPAROSCOPIC NISSEN FUNDOPLICATION: SHX1932

## 2019-03-27 LAB — TYPE AND SCREEN
ABO/RH(D): A POS
Antibody Screen: NEGATIVE

## 2019-03-27 SURGERY — FUNDOPLICATION, NISSEN, LAPAROSCOPIC
Anesthesia: General | Site: Abdomen

## 2019-03-27 MED ORDER — MORPHINE SULFATE (PF) 2 MG/ML IV SOLN
1.0000 mg | INTRAVENOUS | Status: DC | PRN
  Administered 2019-03-27: 2 mg via INTRAVENOUS
  Filled 2019-03-27: qty 1

## 2019-03-27 MED ORDER — BUPIVACAINE-EPINEPHRINE (PF) 0.25% -1:200000 IJ SOLN
INTRAMUSCULAR | Status: AC
  Filled 2019-03-27: qty 30

## 2019-03-27 MED ORDER — HYDROMORPHONE HCL 2 MG/ML IJ SOLN
INTRAMUSCULAR | Status: AC
  Filled 2019-03-27: qty 1

## 2019-03-27 MED ORDER — ROCURONIUM BROMIDE 100 MG/10ML IV SOLN
INTRAVENOUS | Status: DC | PRN
  Administered 2019-03-27 (×2): 20 mg via INTRAVENOUS
  Administered 2019-03-27: 50 mg via INTRAVENOUS

## 2019-03-27 MED ORDER — OXYCODONE HCL 5 MG PO TABS: 5.0000 mg | ORAL_TABLET | Freq: Once | ORAL | Status: DC | PRN

## 2019-03-27 MED ORDER — DEXAMETHASONE SODIUM PHOSPHATE 4 MG/ML IJ SOLN: 4.0000 mg | INTRAMUSCULAR | Status: DC

## 2019-03-27 MED ORDER — PHENYLEPHRINE HCL (PRESSORS) 10 MG/ML IV SOLN
INTRAVENOUS | Status: AC
  Filled 2019-03-27: qty 1

## 2019-03-27 MED ORDER — PROPOFOL 10 MG/ML IV BOLUS
INTRAVENOUS | Status: AC
  Filled 2019-03-27: qty 20

## 2019-03-27 MED ORDER — SUGAMMADEX SODIUM 200 MG/2ML IV SOLN
INTRAVENOUS | Status: AC
  Filled 2019-03-27: qty 2

## 2019-03-27 MED ORDER — HYDRALAZINE HCL 20 MG/ML IJ SOLN: 10.0000 mg | INTRAMUSCULAR | Status: DC | PRN

## 2019-03-27 MED ORDER — SIMETHICONE 80 MG PO CHEW: 80.0000 mg | CHEWABLE_TABLET | Freq: Four times a day (QID) | ORAL | Status: DC | PRN

## 2019-03-27 MED ORDER — HEPARIN SODIUM (PORCINE) 5000 UNIT/ML IJ SOLN
5000.0000 [IU] | Freq: Once | INTRAMUSCULAR | Status: AC
  Administered 2019-03-27: 5000 [IU] via SUBCUTANEOUS
  Filled 2019-03-27: qty 1

## 2019-03-27 MED ORDER — LIDOCAINE 2% (20 MG/ML) 5 ML SYRINGE
INTRAMUSCULAR | Status: AC
  Filled 2019-03-27: qty 10

## 2019-03-27 MED ORDER — MIDAZOLAM HCL 5 MG/5ML IJ SOLN
INTRAMUSCULAR | Status: DC | PRN
  Administered 2019-03-27: 2 mg via INTRAVENOUS

## 2019-03-27 MED ORDER — ACETAMINOPHEN 325 MG PO TABS: 325.0000 mg | ORAL_TABLET | ORAL | Status: DC | PRN

## 2019-03-27 MED ORDER — FENTANYL CITRATE (PF) 100 MCG/2ML IJ SOLN
INTRAMUSCULAR | Status: AC
  Filled 2019-03-27: qty 2

## 2019-03-27 MED ORDER — KETAMINE HCL 10 MG/ML IJ SOLN
INTRAMUSCULAR | Status: DC | PRN
  Administered 2019-03-27: 15 mg via INTRAVENOUS
  Administered 2019-03-27: 10 mg via INTRAVENOUS

## 2019-03-27 MED ORDER — ACETAMINOPHEN 160 MG/5ML PO SOLN
1000.0000 mg | Freq: Three times a day (TID) | ORAL | Status: DC
  Administered 2019-03-27 – 2019-03-28 (×3): 1000 mg via ORAL
  Filled 2019-03-27 (×3): qty 40.6

## 2019-03-27 MED ORDER — HYDROMORPHONE HCL 1 MG/ML IJ SOLN
INTRAMUSCULAR | Status: DC | PRN
  Administered 2019-03-27: 0.5 mg via INTRAVENOUS

## 2019-03-27 MED ORDER — BUPIVACAINE-EPINEPHRINE 0.25% -1:200000 IJ SOLN
INTRAMUSCULAR | Status: DC | PRN
  Administered 2019-03-27: 30 mL

## 2019-03-27 MED ORDER — ONDANSETRON HCL 4 MG/2ML IJ SOLN: 4.0000 mg | Freq: Once | INTRAMUSCULAR | Status: DC | PRN

## 2019-03-27 MED ORDER — DEXAMETHASONE SODIUM PHOSPHATE 10 MG/ML IJ SOLN
INTRAMUSCULAR | Status: DC | PRN
  Administered 2019-03-27: 10 mg via INTRAVENOUS

## 2019-03-27 MED ORDER — BUPIVACAINE LIPOSOME 1.3 % IJ SUSP
INTRAMUSCULAR | Status: DC | PRN
  Administered 2019-03-27: 20 mL

## 2019-03-27 MED ORDER — EPHEDRINE SULFATE-NACL 50-0.9 MG/10ML-% IV SOSY
PREFILLED_SYRINGE | INTRAVENOUS | Status: DC | PRN
  Administered 2019-03-27: 10 mg via INTRAVENOUS

## 2019-03-27 MED ORDER — GABAPENTIN 300 MG PO CAPS
300.0000 mg | ORAL_CAPSULE | ORAL | Status: AC
  Administered 2019-03-27: 300 mg via ORAL
  Filled 2019-03-27: qty 1

## 2019-03-27 MED ORDER — LIDOCAINE 2% (20 MG/ML) 5 ML SYRINGE
INTRAMUSCULAR | Status: DC | PRN
  Administered 2019-03-27: 100 mg via INTRAVENOUS

## 2019-03-27 MED ORDER — ACETAMINOPHEN 500 MG PO TABS
1000.0000 mg | ORAL_TABLET | ORAL | Status: AC
  Administered 2019-03-27: 1000 mg via ORAL
  Filled 2019-03-27: qty 2

## 2019-03-27 MED ORDER — ONDANSETRON HCL 4 MG/2ML IJ SOLN
INTRAMUSCULAR | Status: DC | PRN
  Administered 2019-03-27: 4 mg via INTRAVENOUS

## 2019-03-27 MED ORDER — LACTATED RINGERS IV SOLN
INTRAVENOUS | Status: DC
  Administered 2019-03-27 (×2): via INTRAVENOUS

## 2019-03-27 MED ORDER — OXYCODONE HCL 5 MG/5ML PO SOLN
5.0000 mg | Freq: Once | ORAL | Status: DC | PRN
  Filled 2019-03-27: qty 5

## 2019-03-27 MED ORDER — FENTANYL CITRATE (PF) 100 MCG/2ML IJ SOLN
25.0000 ug | INTRAMUSCULAR | Status: DC | PRN
  Administered 2019-03-27 (×2): 50 ug via INTRAVENOUS

## 2019-03-27 MED ORDER — ONDANSETRON HCL 4 MG/2ML IJ SOLN
4.0000 mg | Freq: Four times a day (QID) | INTRAMUSCULAR | Status: DC | PRN
  Administered 2019-03-27: 4 mg via INTRAVENOUS
  Filled 2019-03-27: qty 2

## 2019-03-27 MED ORDER — KETAMINE HCL 10 MG/ML IJ SOLN
INTRAMUSCULAR | Status: AC
  Filled 2019-03-27: qty 1

## 2019-03-27 MED ORDER — SODIUM CHLORIDE 0.9 % IR SOLN
Status: DC | PRN
  Administered 2019-03-27: 1000 mL

## 2019-03-27 MED ORDER — PROPOFOL 10 MG/ML IV BOLUS
INTRAVENOUS | Status: DC | PRN
  Administered 2019-03-27: 150 mg via INTRAVENOUS

## 2019-03-27 MED ORDER — MEPERIDINE HCL 50 MG/ML IJ SOLN: 6.2500 mg | INTRAMUSCULAR | Status: DC | PRN

## 2019-03-27 MED ORDER — SODIUM CHLORIDE 0.9 % IV SOLN
INTRAVENOUS | Status: DC | PRN
  Administered 2019-03-27: 60 ug/min via INTRAVENOUS
  Administered 2019-03-27: 50 ug/min via INTRAVENOUS

## 2019-03-27 MED ORDER — GABAPENTIN 100 MG PO CAPS
200.0000 mg | ORAL_CAPSULE | Freq: Two times a day (BID) | ORAL | Status: DC
  Administered 2019-03-27 – 2019-03-28 (×2): 200 mg via ORAL
  Filled 2019-03-27 (×2): qty 2

## 2019-03-27 MED ORDER — ENSURE MAX PROTEIN PO LIQD
2.0000 [oz_av] | ORAL | Status: DC
  Administered 2019-03-28 (×5): 2 [oz_av] via ORAL

## 2019-03-27 MED ORDER — FENTANYL CITRATE (PF) 100 MCG/2ML IJ SOLN
INTRAMUSCULAR | Status: DC | PRN
  Administered 2019-03-27: 50 ug via INTRAVENOUS
  Administered 2019-03-27: 100 ug via INTRAVENOUS
  Administered 2019-03-27 (×2): 50 ug via INTRAVENOUS

## 2019-03-27 MED ORDER — LACTATED RINGERS IV SOLN
INTRAVENOUS | Status: AC | PRN
  Administered 2019-03-27: 1000 mL

## 2019-03-27 MED ORDER — OXYCODONE HCL 5 MG/5ML PO SOLN
5.0000 mg | ORAL | Status: DC | PRN
  Administered 2019-03-27 – 2019-03-28 (×3): 10 mg via ORAL
  Filled 2019-03-27 (×3): qty 10

## 2019-03-27 MED ORDER — PHENYLEPHRINE 40 MCG/ML (10ML) SYRINGE FOR IV PUSH (FOR BLOOD PRESSURE SUPPORT)
PREFILLED_SYRINGE | INTRAVENOUS | Status: DC | PRN
  Administered 2019-03-27 (×2): 80 ug via INTRAVENOUS

## 2019-03-27 MED ORDER — MIDAZOLAM HCL 2 MG/2ML IJ SOLN
INTRAMUSCULAR | Status: AC
  Filled 2019-03-27: qty 2

## 2019-03-27 MED ORDER — PANTOPRAZOLE SODIUM 40 MG IV SOLR
40.0000 mg | Freq: Every day | INTRAVENOUS | Status: DC
  Administered 2019-03-27: 40 mg via INTRAVENOUS
  Filled 2019-03-27: qty 40

## 2019-03-27 MED ORDER — SCOPOLAMINE 1 MG/3DAYS TD PT72
1.0000 | MEDICATED_PATCH | TRANSDERMAL | Status: DC
  Administered 2019-03-27: 1.5 mg via TRANSDERMAL
  Filled 2019-03-27: qty 1

## 2019-03-27 MED ORDER — FENTANYL CITRATE (PF) 250 MCG/5ML IJ SOLN
INTRAMUSCULAR | Status: AC
  Filled 2019-03-27: qty 5

## 2019-03-27 MED ORDER — ACETAMINOPHEN 160 MG/5ML PO SOLN: 325.0000 mg | ORAL | Status: DC | PRN

## 2019-03-27 MED ORDER — CHLORHEXIDINE GLUCONATE CLOTH 2 % EX PADS: 6.0000 | MEDICATED_PAD | Freq: Once | CUTANEOUS | Status: DC

## 2019-03-27 MED ORDER — SUGAMMADEX SODIUM 200 MG/2ML IV SOLN
INTRAVENOUS | Status: DC | PRN
  Administered 2019-03-27: 200 mg via INTRAVENOUS

## 2019-03-27 MED ORDER — PROMETHAZINE HCL 25 MG/ML IJ SOLN: 12.5000 mg | Freq: Four times a day (QID) | INTRAMUSCULAR | Status: DC | PRN

## 2019-03-27 MED ORDER — KETOROLAC TROMETHAMINE 15 MG/ML IJ SOLN
15.0000 mg | Freq: Three times a day (TID) | INTRAMUSCULAR | Status: DC | PRN
  Administered 2019-03-27: 15 mg via INTRAVENOUS
  Filled 2019-03-27: qty 1

## 2019-03-27 MED ORDER — SUCCINYLCHOLINE CHLORIDE 200 MG/10ML IV SOSY
PREFILLED_SYRINGE | INTRAVENOUS | Status: DC | PRN
  Administered 2019-03-27: 140 mg via INTRAVENOUS

## 2019-03-27 MED ORDER — ONDANSETRON HCL 4 MG/2ML IJ SOLN
INTRAMUSCULAR | Status: AC
  Filled 2019-03-27: qty 2

## 2019-03-27 MED ORDER — ACETAMINOPHEN 500 MG PO TABS: 1000.0000 mg | ORAL_TABLET | Freq: Three times a day (TID) | ORAL | Status: DC

## 2019-03-27 MED ORDER — DIPHENHYDRAMINE HCL 50 MG/ML IJ SOLN: 12.5000 mg | Freq: Three times a day (TID) | INTRAMUSCULAR | Status: DC | PRN

## 2019-03-27 MED ORDER — ENOXAPARIN SODIUM 30 MG/0.3ML ~~LOC~~ SOLN
30.0000 mg | Freq: Two times a day (BID) | SUBCUTANEOUS | Status: DC
  Administered 2019-03-27 – 2019-03-28 (×2): 30 mg via SUBCUTANEOUS
  Filled 2019-03-27 (×2): qty 0.3

## 2019-03-27 MED ORDER — KCL IN DEXTROSE-NACL 20-5-0.45 MEQ/L-%-% IV SOLN
INTRAVENOUS | Status: DC
  Administered 2019-03-27 – 2019-03-28 (×3): via INTRAVENOUS
  Filled 2019-03-27 (×3): qty 1000

## 2019-03-27 SURGICAL SUPPLY — 45 items
APPLIER CLIP ROT 10 11.4 M/L (STAPLE)
BENZOIN TINCTURE PRP APPL 2/3 (GAUZE/BANDAGES/DRESSINGS) IMPLANT
CABLE HIGH FREQUENCY MONO STRZ (ELECTRODE) IMPLANT
CLIP APPLIE ROT 10 11.4 M/L (STAPLE) IMPLANT
COVER WAND RF STERILE (DRAPES) IMPLANT
DECANTER SPIKE VIAL GLASS SM (MISCELLANEOUS) ×2 IMPLANT
DEVICE SUT QUICK LOAD TK 5 (STAPLE) IMPLANT
DEVICE SUT TI-KNOT TK 5X26 (MISCELLANEOUS) ×1 IMPLANT
DEVICE SUTURE ENDOST 10MM (ENDOMECHANICALS) ×2 IMPLANT
DISSECTOR BLUNT TIP ENDO 5MM (MISCELLANEOUS) ×2 IMPLANT
DRAIN PENROSE 18X1/2 LTX STRL (DRAIN) ×2 IMPLANT
ELECT L-HOOK LAP 45CM DISP (ELECTROSURGICAL) ×2
ELECT PENCIL ROCKER SW 15FT (MISCELLANEOUS) IMPLANT
ELECT REM PT RETURN 15FT ADLT (MISCELLANEOUS) ×2 IMPLANT
ELECTRODE L-HOOK LAP 45CM DISP (ELECTROSURGICAL) ×1 IMPLANT
GLOVE BIO SURGEON STRL SZ7.5 (GLOVE) ×2 IMPLANT
GLOVE BIOGEL PI IND STRL 7.0 (GLOVE) IMPLANT
GLOVE BIOGEL PI INDICATOR 7.0 (GLOVE)
GLOVE INDICATOR 8.0 STRL GRN (GLOVE) ×2 IMPLANT
GOWN STRL REUS W/TWL LRG LVL3 (GOWN DISPOSABLE) ×2 IMPLANT
GOWN STRL REUS W/TWL XL LVL3 (GOWN DISPOSABLE) ×6 IMPLANT
KIT BASIN OR (CUSTOM PROCEDURE TRAY) ×2 IMPLANT
KIT TURNOVER KIT A (KITS) IMPLANT
NS IRRIG 1000ML POUR BTL (IV SOLUTION) ×2 IMPLANT
PACK UNIVERSAL I (CUSTOM PROCEDURE TRAY) ×2 IMPLANT
SCISSORS LAP 5X45 EPIX DISP (ENDOMECHANICALS) ×2 IMPLANT
SET IRRIG TUBING LAPAROSCOPIC (IRRIGATION / IRRIGATOR) ×2 IMPLANT
SET TUBE SMOKE EVAC HIGH FLOW (TUBING) ×2 IMPLANT
SHEARS HARMONIC ACE PLUS 36CM (ENDOMECHANICALS) ×2 IMPLANT
SLEEVE XCEL OPT CAN 5 100 (ENDOMECHANICALS) ×6 IMPLANT
STAPLER VISISTAT 35W (STAPLE) IMPLANT
STRIP CLOSURE SKIN 1/2X4 (GAUZE/BANDAGES/DRESSINGS) IMPLANT
SUT ETHIBOND 0 36 GRN (SUTURE) ×3 IMPLANT
SUT SURGIDAC NAB ES-9 0 48 120 (SUTURE) ×8 IMPLANT
SUT VIC AB 4-0 SH 18 (SUTURE) ×2 IMPLANT
TIP INNERVISION DETACH 40FR (MISCELLANEOUS) IMPLANT
TIP INNERVISION DETACH 50FR (MISCELLANEOUS) IMPLANT
TIP INNERVISION DETACH 56FR (MISCELLANEOUS) ×1 IMPLANT
TIPS INNERVISION DETACH 40FR (MISCELLANEOUS)
TOWEL OR NON WOVEN STRL DISP B (DISPOSABLE) IMPLANT
TRAY FOLEY MTR SLVR 16FR STAT (SET/KITS/TRAYS/PACK) ×2 IMPLANT
TRAY LAPAROSCOPIC (CUSTOM PROCEDURE TRAY) ×2 IMPLANT
TROCAR BLADELESS OPT 5 100 (ENDOMECHANICALS) ×2 IMPLANT
TROCAR XCEL BLUNT TIP 100MML (ENDOMECHANICALS) IMPLANT
TROCAR XCEL NON-BLD 11X100MML (ENDOMECHANICALS) ×2 IMPLANT

## 2019-03-27 NOTE — Interval H&P Note (Signed)
History and Physical Interval Note:  03/27/2019 7:30 AM  Sarah [name]  has presented today for surgery, with the diagnosis of GERD, H/O NISSEN FUNDOPLICATION.  The various methods of treatment have been discussed with the patient and family. After consideration of risks, benefits and other options for treatment, the patient has consented to  Procedure(s): DIAGNOSTIC LAPAROSCOPY WITH REDO FUNDOPLICATION, POSSIBLE HIATAL HERNIA REPAIR, POSSIBLE UPPER ENDOSCOPY (N/A) as a surgical intervention.  The patient's history has been reviewed, patient examined, no change in status, stable for surgery.  I have reviewed the patient's chart and labs.  Questions were answered to the patient's satisfaction.    Leighton Ruff. Redmond Pulling, MD, FACS General, Bariatric, & Minimally Invasive Surgery [facility] [place]  Greer Pickerel

## 2019-03-27 NOTE — Anesthesia Postprocedure Evaluation (Signed)
Anesthesia Post Note  Patient: Sarah [name]  Procedure(s) Performed: laparoscopic repair of rercurrent hiatal hernia and redo nissen fundoplication UPPER ENDOSCOPY (N/A Abdomen)     Patient location during evaluation: PACU Anesthesia Type: General Level of consciousness: awake and alert Pain management: pain level controlled Vital Signs Assessment: post-procedure vital signs reviewed and stable Respiratory status: spontaneous breathing, nonlabored ventilation, respiratory function stable and patient connected to nasal cannula oxygen Cardiovascular status: blood pressure returned to baseline and stable Postop Assessment: no apparent nausea or vomiting Anesthetic complications: no    Last Vitals:  Vitals:   03/27/19 1200 03/27/19 1222  BP: 129/84 135/85  Pulse: 88 96  Resp: 11 15  Temp: 36.4 C (!) 36.4 C  SpO2: 97% 100%    Last Pain:  Vitals:   03/27/19 1222  TempSrc: Oral  PainSc:                  ,

## 2019-03-27 NOTE — Brief Op Note (Signed)
03/27/2019  10:35 AM  PATIENT:  Etheleen Sia  68 y.o. female  PRE-OPERATIVE DIAGNOSIS:  GERD, H/O LAP NISSEN FUNDOPLICATION with slipped wrap  POST-OPERATIVE DIAGNOSIS:  Same + recurrent hiatal hernia  PROCEDURE:  Procedure(s): laparoscopic repair of recurrent hiatal hernia and redo nissen fundoplication UPPER ENDOSCOPY (N/A)  SURGEON:  Surgeon(s) and Role:    Greer Pickerel, MD - Primary    * Alphonsa Overall, MD - Assisting  PHYSICIAN ASSISTANT:   ASSISTANTS: see above   ANESTHESIA:   general  EBL:  25 mL   BLOOD ADMINISTERED:none  DRAINS: none   LOCAL MEDICATIONS USED:  MARCAINE    and OTHER exparel TAP blocks  SPECIMEN:  No Specimen  DISPOSITION OF SPECIMEN:  N/A  COUNTS:  YES  TOURNIQUET:  * No tourniquets in log *  DICTATION: .Other Dictation: Dictation Number [id number]  PLAN OF CARE: Admit for overnight observation  PATIENT DISPOSITION:  PACU - hemodynamically stable.   Delay start of Pharmacological VTE agent (>24hrs) due to surgical blood loss or risk of bleeding: no  Leighton Ruff. Redmond Pulling, MD, FACS General, Bariatric, & Minimally Invasive Surgery [facility], [place]

## 2019-03-27 NOTE — Anesthesia Procedure Notes (Signed)
Procedure Name: Intubation Date/Time: 03/27/2019 7:45 AM Performed by: Gwyndolyn Saxon, CRNA Pre-anesthesia Checklist: Patient identified, Emergency Drugs available, Suction available and Patient being monitored Patient Re-evaluated:Patient Re-evaluated prior to induction Oxygen Delivery Method: Circle system utilized Preoxygenation: Pre-oxygenation with 100% oxygen Induction Type: IV induction and Rapid sequence Ventilation: Mask ventilation without difficulty Laryngoscope Size: Miller and 2 Grade View: Grade I Tube type: Oral Tube size: 7.0 mm Number of attempts: 1 Airway Equipment and Method: Patient positioned with wedge pillow and Stylet Placement Confirmation: ETT inserted through vocal cords under direct vision,  positive ETCO2 and breath sounds checked- equal and bilateral Secured at: 20 cm Tube secured with: Tape Dental Injury: Teeth and Oropharynx as per pre-operative assessment

## 2019-03-27 NOTE — Transfer of Care (Signed)
Immediate Anesthesia Transfer of Care Note  Patient: Sarah [name]  Procedure(s) Performed: laparoscopic repair of rercurrent hiatal hernia and redo nissen fundoplication UPPER ENDOSCOPY (N/A Abdomen)  Patient Location: PACU  Anesthesia Type:General  Level of Consciousness: drowsy  Airway & Oxygen Therapy: Patient Spontanous Breathing and Patient connected to face mask oxygen  Post-op Assessment: Report given to RN and Post -op Vital signs reviewed and stable  Post vital signs: Reviewed and stable  Last Vitals:  Vitals Value Taken Time  BP    Temp    Pulse    Resp    SpO2      Last Pain:  Vitals:   03/27/19 0552  TempSrc:   PainSc: 0-No pain         Complications: No apparent anesthesia complications

## 2019-03-27 NOTE — Op Note (Signed)
NAME: Sarah [name], Sarah [name] MEDICAL RECORD [id number] ACCOUNT [id number] DATE OF BIRTH:24-Dec-1950 FACILITY: [facility] LOCATION: [facility] PHYSICIAN: Ronnie Derby, MD  OPERATIVE REPORT  DATE OF PROCEDURE:  03/27/2019  PREOPERATIVE DIAGNOSIS:   1.  Gastroesophageal reflux disease. 2.  History of laparoscopic Nissen fundoplication in the 6295M with a slipped wrap.    POSTOPERATIVE DIAGNOSES: 1.  Gastroesophageal reflux disease. 2.  History of laparoscopic Nissen fundoplication in the 8413K with a slipped wrap.   3.  Recurrent hiatal hernia.  PROCEDURES:   1.  Laparoscopic repair of recurrent hiatal hernia and redo Nissen fundoplication. 2.  Upper endoscopy.  SURGEON:  Leighton Ruff. Redmond Pulling, M.D.  ASSISTANT:  Alphonsa Overall, M.D.  ANESTHESIA:  General.  ESTIMATED BLOOD LOSS:  25 mL.  SPECIMEN:  None.  FINDINGS:  The patient had some mild scarring in her foregut around her hiatus and along the greater curvature and around the left and right crura from her prior fundoplication.  Her wrap had become undone.  There was minimal scar tissue within the  actual mediastinum.  That portion of the procedure was relatively straightforward mobilization of the intrathoracic esophagus.  We were able to mobilize the esophagus and get the GE junction well below the hiatus.  The crura were reapproximated with  several interrupted 0 Ethibond sutures secured with a titanium tie knot.  The wrap was done over a 51 French lighted bougie.  The wrap was approximately 3 cm in length and was slightly floppy.  INDICATIONS:  The patient is a 68 year old female who was referred initially what was felt to be a fundal diverticulum seen on upper endoscopy and upper GI imaging.  The patient had a longstanding history of severe GERD.  Upon further questioning the  patient, it appeared that she had a prior foregut procedure in the 1990s.  She described what appeared to have been a laparoscopic Nissen fundoplication.  An operative  note  could not be located.  Once we obtain this history, the upper GI and upper  endoscopy images were more consistent with a wrap that had become undone.  There was perhaps a subtlety of a recurrent hiatal hernia as well as seen on CT imaging as well as on upper endoscopy.  We discussed surgical options such as going in laparoscopic  trying to mobilize and undo her prior wrap and dissecting out the crura and seeing if there is recurrent hiatal hernia and repairing that.  We discussed that she would be at increased risk for enterotomy or gastrotomy, recurrence of her hiatal hernia,  failure to ameliorate her symptoms, a gas bloat, dysphagia as well as the typical risk of a general anesthetic procedure.  These were documented in the medical record.  Please see those notes.  DESCRIPTION OF PROCEDURE:  The patient underwent ERAS protocol.  She received preoperative Tylenol, gabapentin as well as a preoperative shake.  She received 5000 units of subcutaneous heparin.  She was then taken to OR 1 at [facility] and  placed supine on the operating room table.  General endotracheal anesthesia was established.  Sequential compression devices were placed.  A Foley catheter was placed.  Her arms were tucked at her side with the appropriate padding.  Her abdomen was  prepped and draped in the usual standard surgical fashion.  She received IV antibiotic prior to skin incision.  A surgical timeout was performed.  I decided to gain access to the abdomen using the Optiview technique.  A small incision was made in the  left upper quadrant just below the left subcostal margin with a 15 blade.  Then, using a 0 degree 5 mm laparoscope, I advanced it through all layers  of the abdominal wall and carefully entered the abdominal cavity.  Pneumoperitoneum was smoothly established up to a patient pressure of 15 mmHg without any change in vital signs.  The laparoscope was advanced and abdominal cavity was surveilled.   There  were no adhesions to the anterior abdominal wall.  The patient was placed in reverse Trendelenburg.  Additional trocars were placed slightly above and to the left of the umbilicus in the right lateral abdominal wall and in the left lateral abdominal wall  with 5 mm trocars under direct visualization and a 12 mm trocar in the right mid abdomen, all under direct visualization.  Bilateral lateral abdominal wall Exparel and Marcaine TAP block was placed laparoscopically.  A Nathanson liver retractor was then  placed underneath the left lobe of the liver.  Her liver was slightly fatty.  There were some mild adhesions underneath the left lobe of the liver to the upper stomach and gastrohepatic area.  There appeared to be some scar tissue around the upper  stomach just below the hiatus.  It appeared that there had been a prior mobilization along the greater curvature of the stomach.  We came across what we identified to be a partial wrap that was further down on the stomach, probably about 5 cm below the  hiatus.  I decided to incise the gastrohepatic ligament first and start to work toward the right crura of the diaphragm.  Some adhesions were taken down underneath the left lobe of the liver with hook electrocautery as well as with EndoShears with  electrocautery.  The caudate lobe was identified and then we subsequently identified the right crura of the diaphragm.  At this point, I decided to approach the left crura of the diaphragm from the left upper quadrant.  The stomach was grasped and  rotated toward the right.  We took down some scar tissue along the greater curvature of the stomach from where it had previously mobilized.  This was done with Harmonic scalpel.  We were able to come up posteriorly underneath the stomach and identify the  left crus of the diaphragm.  Adhesions were taken down with scissors with and without electrocautery as well as with Harmonic scalpel.  The left crus of the diaphragm  was identified.  We were able to mobilize it caudad and then we came across what  appeared to be a stitch in the crura of the diaphragm inferiorly.  We thought this could be one of the hiatal sutures placed by the prior surgery.  We were able to lift up and get behind the left upper stomach & the distal esophagus and get into the  mediastinum.  It was a fairly avascular and had minimal scar tissue.  At this point, I decided to go back to the right side.  My partner left his surgical prestige on the left side.  We were able to identify it on the right side.  We did take down that  suture and we were able to now be completely around the distal esophagus and upper stomach.  A Penrose was placed around the distal esophagus and upper stomach and secured to itself, so my partner could lift up on the distal esophagus and stomach.  This  exposed the left and right crura of the diaphragm.  At this point, I started  my mediastinal dissection and mobilized some intrathoracic esophagus.  Again, as stated before, there is almost normal tissue in this plane meaning, no scar tissue.  This was  done with a gentle blunt dissection.  The posterior vagus nerve had been identified and preserved.  At this point, we felt that we had achieved adequate intraabdominal esophageal length.  It appeared that the probable GE junction was below the diaphragm.   The integrity of the left and right crura muscles was good.  At this point, the Penrose was let down and we started to identify the prior wrap and mobilize that.  We identified the wrap and lifted  it up and would leave a plane underneath the wrap.  We  took down the wrap using scissors with hematologic cautery.  It appeared that the wrap had become loosened and was probably was on the upper stomach as opposed to the lower esophagus.  A clip had been placed during the previous surgery and that was  removed.  At this point, the wrap was completely done and the stomach was back in its  normal anatomic position.  I decided to take down a few more short gastrics on the greater curvature of the stomach.    At this point, my partner scrubbed out and performed an upper endoscopy.  Please see his note for further details.    We identified the GE junction.  The GE junction was still at the level of the hiatus.  The rest of the endoscopy was normal.  There was no evidence of injury to the esophagus or proximal stomach.  At this point, the stomach was desufflated and the  endoscope was removed.  My partner scrubbed back in.  We then resecured the Penrose around the esophagus.  I continued mobilizing more of the intrathoracic esophagus by taking down surrounding thin tissue with Harmonic scalpel, staying away from the  esophagus.  I was able to mobilize an additional 2 cm and get the GE junction  2 cm below the hiatus.  At this point, I started to reapproximate the crura of the diaphragm.  This was done with 3 interrupted 0 Ethibond sutures.  Each were secured with a  titanium tie knot.  At this point, the CRNA pass the 56-French lighted bougie down the esophagus down the stomach.  This was done with direct visualization as confirm laparoscopically.  I then performed a typical Nissen fundoplication.  The shoeshine  maneuver was performed and was not under tension.  A total of 4 interrupted 0 Ethibond sutures were placed three of the sutures had a bite of esophagus and each of these sutures were secured with a titanium tie knot.  The lighted bougie was removed.   There was no tension on the stomach and the esophagus stayed within the abdominal cavity without any tension.  I then decided to place an additional crural suture posteriorly so there were a total of 4 crural sutures.  At this point, the diaphragm was  well approximated.  It had accommodated a 56-French bougie.  The pneumoperitoneum was released.  Trocars were removed.  Skin incisions were closed with 4-0 Monocryl followed by Dermabond.   All needle, instrument and sponge counts were correct x2.    There were no immediate complications.  The patient tolerated the procedure well.  She was taken to recovery room in stable condition.  AN/NUANCE  D:03/27/2019 T:03/27/2019 JOB:[id number]

## 2019-03-28 ENCOUNTER — Other Ambulatory Visit: Payer: Self-pay | Admitting: *Deleted

## 2019-03-28 ENCOUNTER — Encounter (HOSPITAL_COMMUNITY): Payer: Self-pay | Admitting: General Surgery

## 2019-03-28 ENCOUNTER — Observation Stay (HOSPITAL_COMMUNITY): Payer: PPO

## 2019-03-28 DIAGNOSIS — K9189 Other postprocedural complications and disorders of digestive system: Secondary | ICD-10-CM | POA: Diagnosis not present

## 2019-03-28 DIAGNOSIS — R140 Abdominal distension (gaseous): Secondary | ICD-10-CM | POA: Diagnosis not present

## 2019-03-28 LAB — COMPREHENSIVE METABOLIC PANEL
ALT: 63 U/L — ABNORMAL HIGH (ref 0–44)
AST: 77 U/L — ABNORMAL HIGH (ref 15–41)
Albumin: 3.2 g/dL — ABNORMAL LOW (ref 3.5–5.0)
Alkaline Phosphatase: 52 U/L (ref 38–126)
Anion gap: 9 (ref 5–15)
BUN: 17 mg/dL (ref 8–23)
CO2: 21 mmol/L — ABNORMAL LOW (ref 22–32)
Calcium: 7.4 mg/dL — ABNORMAL LOW (ref 8.9–10.3)
Chloride: 101 mmol/L (ref 98–111)
Creatinine, Ser: 0.73 mg/dL (ref 0.44–1.00)
GFR calc Af Amer: >60 mL/min (ref >60)
GFR calc non Af Amer: >60 mL/min (ref >60)
Glucose, Bld: 148 mg/dL — ABNORMAL HIGH (ref 70–99)
Potassium: 4 mmol/L (ref 3.5–5.1)
Sodium: 131 mmol/L — ABNORMAL LOW (ref 135–145)
Total Bilirubin: 0.3 mg/dL (ref 0.3–1.2)
Total Protein: 6.1 g/dL — ABNORMAL LOW (ref 6.5–8.1)

## 2019-03-28 LAB — CBC WITH DIFFERENTIAL/PLATELET
Abs Immature Granulocytes: 0.06 10*3/uL (ref 0.00–0.07)
Basophils Absolute: 0 10*3/uL (ref 0.0–0.1)
Basophils Relative: 0 %
Eosinophils Absolute: 0 10*3/uL (ref 0.0–0.5)
Eosinophils Relative: 0 %
HCT: 32.8 % — ABNORMAL LOW (ref 36.0–46.0)
Hemoglobin: 10.7 g/dL — ABNORMAL LOW (ref 12.0–15.0)
Immature Granulocytes: 0 %
Lymphocytes Relative: 12 %
Lymphs Abs: 1.9 10*3/uL (ref 0.7–4.0)
MCH: 30.5 pg (ref 26.0–34.0)
MCHC: 32.6 g/dL (ref 30.0–36.0)
MCV: 93.4 fL (ref 80.0–100.0)
Monocytes Absolute: 0.8 10*3/uL (ref 0.1–1.0)
Monocytes Relative: 5 %
Neutro Abs: 13.1 10*3/uL — ABNORMAL HIGH (ref 1.7–7.7)
Neutrophils Relative %: 83 %
Platelets: 191 10*3/uL (ref 150–400)
RBC: 3.51 MIL/uL — ABNORMAL LOW (ref 3.87–5.11)
RDW: 13.5 % (ref 11.5–15.5)
WBC: 15.8 10*3/uL — ABNORMAL HIGH (ref 4.0–10.5)
nRBC: 0 % (ref 0.0–0.2)

## 2019-03-28 MED ORDER — ACETAMINOPHEN 325 MG PO TABS: 650.0000 mg | ORAL_TABLET | Freq: Four times a day (QID) | ORAL | 0 refills | Status: AC

## 2019-03-28 MED ORDER — OXYCODONE HCL 5 MG PO TABS: 5.0000 mg | ORAL_TABLET | Freq: Four times a day (QID) | ORAL | 0 refills | Status: DC | PRN

## 2019-03-28 MED ORDER — ESTRADIOL 1 MG PO TABS: 1.0000 mg | ORAL_TABLET | Freq: Every day | ORAL | 4 refills | Status: DC

## 2019-03-28 MED ORDER — IOHEXOL 300 MG/ML  SOLN
50.0000 mL | Freq: Once | INTRAMUSCULAR | Status: AC | PRN
  Administered 2019-03-28: 50 mL via ORAL

## 2019-03-28 NOTE — Progress Notes (Signed)
Discharge instructions given to pt and all questions were answered. Pt taken down via wheelchair and was picked up by her sister. 

## 2019-03-28 NOTE — Discharge Instructions (Signed)
[facility]: POST OP INSTRUCTIONS Always review your discharge instruction sheet given to you by the facility where your surgery was performed. IF YOU HAVE DISABILITY OR FAMILY LEAVE FORMS, YOU MUST BRING THEM TO THE OFFICE FOR PROCESSING.   DO NOT GIVE THEM TO YOUR DOCTOR.  PAIN CONTROL  1. First take acetaminophen (Tylenol) AND/or ibuprofen (Advil) to control your pain after surgery.  Follow directions on package.  Taking acetaminophen (Tylenol) and/or ibuprofen (Advil) regularly after surgery will help to control your pain and lower the amount of prescription pain medication you may need.  You should not take more than 3,000 mg (3 grams) of acetaminophen (Tylenol) in 24 hours.  You should not take ibuprofen (Advil), aleve, motrin, naprosyn or other NSAIDS if you have a history of stomach ulcers or chronic kidney disease.  2. A prescription for pain medication may be given to you upon discharge.  Take your pain medication as prescribed, if you still have uncontrolled pain after taking acetaminophen (Tylenol) or ibuprofen (Advil). 3. Use ice packs to help control pain. 4. If you need a refill on your pain medication, please contact your pharmacy.  They will contact our office to request authorization. Prescriptions will not be filled after 5pm or on week-ends.  HOME MEDICATIONS 5. Take your usually prescribed medications unless otherwise directed.  DIET 6. SEE INSTRUCTIONS BELOW  CONSTIPATION 7. It is common to experience some constipation after surgery and if you are taking pain medication.  Increasing fluid intake and taking a stool softener (such as Colace) will usually help or prevent this problem from occurring.  A mild laxative (Milk of Magnesia or Miralax) should be taken according to package instructions if there are no bowel movements after 48 hours.  WOUND/INCISION CARE 8. Most patients will experience some swelling and bruising in the  area of the incisions.  Ice packs will help.  Swelling and bruising can take several days to resolve.  9. Unless discharge instructions indicate otherwise, follow guidelines below  a. STERI-STRIPS - you may remove your outer bandages 48 hours after surgery, and you may shower at that time.  You have steri-strips (small skin tapes) in place directly over the incision.  These strips should be left on the skin for 7-10 days.   b. DERMABOND/SKIN GLUE - you may shower in 24 hours.  The glue will flake off over the next 2-3 weeks. 10. Any sutures or staples will be removed at the office during your follow-up visit.  ACTIVITIES 11. You may resume regular (light) daily activities beginning the next day--such as daily self-care, walking, climbing stairs--gradually increasing activities as tolerated.  You may have sexual intercourse when it is comfortable.  Refrain from any heavy lifting or straining until approved by your doctor. a. You may drive when you are no longer taking prescription pain medication, you can comfortably wear a seatbelt, and you can safely maneuver your car and apply brakes.  FOLLOW-UP 12. You should see your doctor in the office for a follow-up appointment approximately 2-3 weeks after your surgery.  You should have been given your post-op/follow-up appointment when your surgery was scheduled.  If you did not receive a post-op/follow-up appointment, make sure that you call for this appointment within a day or two after you arrive home to insure a convenient appointment time.  OTHER INSTRUCTIONS 13. SEE DIET INSTRUCTIONS BELOW  WHEN TO CALL YOUR DOCTOR: 1. Fever over 101.0 2. Inability to urinate 3. Continued bleeding from  incision. 4. Increased pain, redness, or drainage from the incision. 5. Increasing abdominal pain  The clinic staff is available to answer your questions during regular business hours.  Please dont hesitate to call and ask to speak to one of the nurses for  clinical concerns.  If you have a medical emergency, go to the nearest emergency room or call 911.  A surgeon from [facility] is always on call at the hospital. [address] [place], [place], [place]  [address] ? [address], [place], [place]   [address] [phone] ? [phone] ? FAX [phone] Web site: [email or web address]     Managing Your Pain After Surgery Without Opioids    Thank you for participating in our program to help patients manage their pain after surgery without opioids. This is part of our effort to provide you with the best care possible, without exposing you or your family to the risk that opioids pose.  What pain can I expect after surgery? You can expect to have some pain after surgery. This is normal. The pain is typically worse the day after surgery, and quickly begins to get better. Many studies have found that many patients are able to manage their pain after surgery with Over-the-Counter (OTC) medications such as Tylenol and Motrin. If you have a condition that does not allow you to take Tylenol or Motrin, notify your surgical team.  How will I manage my pain? The best strategy for controlling your pain after surgery is around the clock pain control with Tylenol (acetaminophen) and Motrin (ibuprofen or Advil). Alternating these medications with each other allows you to maximize your pain control. In addition to Tylenol and Motrin, you can use heating pads or ice packs on your incisions to help reduce your pain.  How will I alternate your regular strength over-the-counter pain medication? You will take a dose of pain medication every three hours. ; Start by taking 650 mg of Tylenol (2 pills of 325 mg) ; 3 hours later take 600 mg of Motrin (3 pills of 200 mg) ; 3 hours after taking the Motrin take 650 mg of Tylenol ; 3 hours after that take 600 mg of Motrin.   - 1 -  See example - if your first dose of Tylenol  is at 12:00 PM   12:00 PM Tylenol 650 mg (2 pills of 325 mg)  3:00 PM Motrin 600 mg (3 pills of 200 mg)  6:00 PM Tylenol 650 mg (2 pills of 325 mg)  9:00 PM Motrin 600 mg (3 pills of 200 mg)  Continue alternating every 3 hours   We recommend that you follow this schedule around-the-clock for at least 3 days after surgery, or until you feel that it is no longer needed. Use the table on the last page of this handout to keep track of the medications you are taking. Important: Do not take more than 3000mg  of Tylenol or 2300mg  of Motrin in a 24-hour period. Do not take ibuprofen/Motrin if you have a history of bleeding stomach ulcers, severe kidney disease, &/or actively taking a blood thinner  What if I still have pain? If you have pain that is not controlled with the over-the-counter pain medications (Tylenol and Motrin or Advil) you might have what we call breakthrough pain. You will receive a prescription for a small amount of an opioid pain medication such as Oxycodone, Tramadol, or Tylenol with Codeine. Use these opioid pills in the first 24 hours after surgery  if you have breakthrough pain. Do not take more than 1 pill every 4-6 hours.  If you still have uncontrolled pain after using all opioid pills, don't hesitate to call our staff using the number provided. We will help make sure you are managing your pain in the best way possible, and if necessary, we can provide a prescription for additional pain medication.   Day 1    Time  Name of Medication Number of pills taken  Amount of Acetaminophen  Pain Level   Comments  AM PM       AM PM       AM PM       AM PM       AM PM       AM PM       AM PM       AM PM       Total Daily amount of Acetaminophen Do not take more than  3,000 mg per day      Day 2    Time  Name of Medication Number of pills taken  Amount of Acetaminophen  Pain Level   Comments  AM PM       AM PM       AM PM       AM PM       AM PM       AM PM        AM PM       AM PM       Total Daily amount of Acetaminophen Do not take more than  3,000 mg per day      Day 3    Time  Name of Medication Number of pills taken  Amount of Acetaminophen  Pain Level   Comments  AM PM       AM PM       AM PM       AM PM          AM PM       AM PM       AM PM       AM PM       Total Daily amount of Acetaminophen Do not take more than  3,000 mg per day      Day 4    Time  Name of Medication Number of pills taken  Amount of Acetaminophen  Pain Level   Comments  AM PM       AM PM       AM PM       AM PM       AM PM       AM PM       AM PM       AM PM       Total Daily amount of Acetaminophen Do not take more than  3,000 mg per day      Day 5    Time  Name of Medication Number of pills taken  Amount of Acetaminophen  Pain Level   Comments  AM PM       AM PM       AM PM       AM PM       AM PM       AM PM       AM PM       AM PM       Total Daily amount of Acetaminophen  Do not take more than  3,000 mg per day       Day 6    Time  Name of Medication Number of pills taken  Amount of Acetaminophen  Pain Level  Comments  AM PM       AM PM       AM PM       AM PM       AM PM       AM PM       AM PM       AM PM       Total Daily amount of Acetaminophen Do not take more than  3,000 mg per day      Day 7    Time  Name of Medication Number of pills taken  Amount of Acetaminophen  Pain Level   Comments  AM PM       AM PM       AM PM       AM PM       AM PM       AM PM       AM PM       AM PM       Total Daily amount of Acetaminophen Do not take more than  3,000 mg per day        For additional information about how and where to safely dispose of unused opioid medications - [employer]  Disclaimer: This document contains information and/or instructional materials adapted from [place] for the typical patient with your condition. It does not replace  medical advice from your health care provider because your experience may differ from that of the typical patient. Talk to your health care provider if you have any questions about this document, your condition or your treatment plan. Adapted from [place] (Stomach Fundoplication, Hiatal Hernia repair, Achalasia surgery, etc)  ######################################################################  EAT Start with a pureed / full liquid diet (see below) Gradually transition to a high fiber diet with a fiber supplement over the next month after discharge.    WALK Walk an hour a day.  Control your pain to do that.    CONTROL PAIN Control pain so that you can walk, sleep, tolerate sneezing/coughing, go up/down stairs.  HAVE A BOWEL MOVEMENT DAILY Keep your bowels regular to avoid problems.  OK to try a laxative to override constipation.  OK to use an antidairrheal to slow down diarrhea.  Call if not better after 2 tries  CALL IF YOU HAVE PROBLEMS/CONCERNS Call if you are still struggling despite following these instructions. Call if you have concerns not answered by these instructions  ######################################################################   After your esophageal surgery, expect some sticking with swallowing over the next 1-2 months.    If food sticks when you eat, it is called "dysphagia".  This is due to swelling around your esophagus at the wrap & hiatal diaphragm repair.  It will gradually ease off over the next few months.  To help you through this temporary phase, we start you out on a pureed (blenderized) diet.  Your first meal in the hospital was thin liquids.  You should have been given a FULL LIQUID diet by the time you left the hospital.  We ask that you stay on a full liquid diet for the next few days. If full liquids are going well (minimal pain, no nausea, no sticking sensation or cramps with drinking), then you  can progress to PUREED DIET next Monday. We ask patients to stay on a pureed diet for the first 2-3 weeks to avoid anything getting "stuck" near your recent surgery.  Don't be alarmed if your ability to swallow doesn't progress according to this plan.  Everyone is different and some diets can advance more or less quickly.     Some BASIC RULES to follow are:  Maintain an upright position whenever eating or drinking.  Take small bites - just a teaspoon size bite at a time.  Eat slowly.  It may also help to eat only one food at a time.  Consider nibbling through smaller, more frequent meals & avoid the urge to eat BIG meals  Do not push through feelings of fullness, nausea, or bloatedness  Do not mix solid foods and liquids in the same mouthful  Try not to "wash foods down" with large gulps of liquids.  Avoid carbonated (bubbly/fizzy) drinks.    Avoid foods that make you feel gassy or bloated.  Start with bland foods first.  Wait on trying greasy, fried, or spicy meals until you are tolerating more bland solids well.  Understand that it will be hard to burp and belch at first.  This gradually improves with time.  Expect to be more gassy/flatulent/bloated initially.  Walking will help your body manage it better.  Consider using medications for bloating that contain simethicone such as  Maalox or Gas-X   Eat in a relaxed atmosphere & minimize distractions.  Avoid talking while eating.    Do not use straws.  Following each meal, sit in an upright position (90 degree angle) for 60 to 90 minutes.  Going for a short walk can help as well  If food does stick, don't panic.  Try to relax and let the food pass on its own.  Sipping WARM LIQUID such as strong hot black tea can also help slide it down.   Be gradual in changes & use common sense:  -If you easily tolerating a certain "level" of foods, advance to the next level gradually -If you are having trouble swallowing a particular food,  then avoid it.   -If food is sticking when you advance your diet, go back to thinner previous diet (the lower LEVEL) for 1-2 days.   Full Liquid Diet A full liquid diet refers to fluids and foods that are liquid or will become liquid at room temperature. This diet should only be used for a short period of time to help you recover from illness or surgery. Your health care provider or dietitian will help you determine when it is safe to eat regular foods. What are tips for following this plan?     Reading food labels  Check food labels of nutrition shakes for the amount of protein. Look for nutrition shakes that have at least 8-10 grams of protein in each serving.  Look for drinks, such as milks and juices, that are "fortified" or "enriched." This means that vitamins and minerals have been added. Shopping  Buy premade nutritional shakes to keep on hand.  To vary your choices, buy different flavors of milks and shakes. Meal planning  Choose flavors and foods that you enjoy.  To make sure you get enough energy from food (calories): ? Eat 3 full liquid meals each day. Have a liquid snack between each meal. ? Drink 6-8 ounces (177-237 ml) of a nutrition supplement shake with meals or as snacks. ? Add protein powder, powdered milk, milk,  or yogurt to shakes to increase the amount of protein.  Drink at least one serving a day of citrus fruit juice or fruit juice that has vitamin C added. General guidelines  Before starting the full-liquid diet, check with your health care provider to know what foods you should avoid. These may include full-fat or high-fiber liquids.  You may have any liquid or food that becomes a liquid at room temperature. The food is considered a liquid if it can be poured off a spoon at room temperature.  Do not drink alcohol unless approved by your health care provider.  This diet gives you most of the nutrients that you need for energy, but you may not get enough  of certain vitamins, minerals, and fiber. Make sure to talk to your health care provider or dietitian about: ? How many calories you need to eat get day. ? How much fluid you should have each day. ? Taking a multivitamin or a nutritional supplement. What foods are allowed? The items listed may not be a complete list. Talk with your dietitian about what dietary choices are best for you. Grains Thin hot cereal, such as cream of wheat. Soft-cooked pasta or rice pured in soup. Vegetables Pulp-free tomato or vegetable juice. Vegetables pured in soup. Fruits Fruit juice without pulp. Strained fruit pures (seeds and skins removed). Meats and other protein foods Beef, chicken, and fish broths. Powdered protein supplements. Dairy Milk and milk-based beverages, including milk shakes and instant breakfast mixes. Smooth yogurt. Pured cottage cheese. Beverages Water. Coffee and tea (caffeinated or decaffeinated). Cocoa. Liquid nutritional supplements. Soft drinks. Nondairy milks, such as almond, coconut, rice, or soy milk. Fats and oils Melted margarine and butter. Cream. Canola, almond, avocado, corn, grapeseed, sunflower, and sesame oils. Gravy. Sweets and desserts Custard. Pudding. Flavored gelatin. Smooth ice cream (without nuts or candy pieces). Sherbet. Popsicles. [place] ice. Pudding pops. Seasoning and other foods Salt and pepper. Spices. Cocoa powder. Vinegar. Ketchup. Yellow mustard. Smooth sauces, such as [place], cheese sauce, or white sauce. Soy sauce. Cream soups. Strained soups. Syrup. Honey. Jelly (without fruit pieces). What foods are not allowed? The items listed may not be a complete list. Talk with your dietitian about what dietary choices are best for you. Grains Whole grains. Pasta. Rice. Cold cereal. Bread. Crackers. Vegetables All whole fresh, frozen, or canned vegetables. Fruits All whole fresh, frozen, or canned fruits. Meats and other protein foods All cuts of  meat, poultry, and fish. Eggs. Tofu and soy protein. Nuts and nut butters. Lunch meat. Sausage. Dairy Hard cheese. Yogurt with fruit chunks. Fats and oils Coconut oil. Palm oil. Lard. Cold butter. Sweets and desserts Ice cream or other frozen desserts that have any solids in them or on top, such as nuts, chocolate chips, and pieces of cookies. Cakes. Cookies. Candy. Seasoning and other foods Stone-ground mustards. Soups with chunks or pieces. Summary  A full liquid diet refers to fluids and foods that are liquid or will become liquid at room temperature.  This diet should only be used for a short period of time to help you recover from illness or surgery. Ask your health care provider or dietitian when it is safe for you to eat regular foods.  To make sure you get enough calories and nutrients, eat 3 meals each day with snacks between. Drink premade nutrition supplement shakes or add protein powder to homemade shakes. Take a vitamin and mineral supplement as told by your health care provider. This information is not  intended to replace advice given to you by your health care provider. Make sure you discuss any questions you have with your health care provider. Document Released: 09/21/2005 Document Revised: 11/04/2016 Document Reviewed: 11/04/2016 Elsevier Interactive Patient Education  2019 Delta 1 = PUREED DIET  Starting Monday June 29 th for  1 WEEKS   -Foods in this group are pureed or blenderized to a smooth, mashed potato-like consistency.  -If necessary, the pureed foods can keep their shape with the addition of a thickening agent.   -Meat should be pureed to a smooth, pasty consistency.  Hot broth or gravy may be added to the pureed meat, approximately 1 oz. of liquid per 3 oz. serving of meat. -CAUTION:  If any foods do not puree into a smooth consistency, swallowing will be more difficult.  (For example, nuts or seeds sometimes do not blend well.)  Hot Foods  Cold Foods  Pureed scrambled eggs and cheese Pureed cottage cheese  Baby cereals Thickened juices and nectars  Thinned cooked cereals (no lumps) Thickened milk or eggnog  Pureed [place] toast or pancakes Ensure  Mashed potatoes Ice cream  Pureed parsley, au gratin, scalloped potatoes, candied sweet potatoes Fruit or [place] ice, sherbet  Pureed buttered or alfredo noodles Plain yogurt  Pureed vegetables (no corn or peas) Instant breakfast  Pureed soups and creamed soups Smooth pudding, mousse, custard  Pureed scalloped apples Whipped gelatin  Gravies Sugar, syrup, honey, jelly  Sauces, cheese, tomato, barbecue, white, creamed Cream  Any baby food Creamer  Alcohol in moderation (not beer or champagne) Margarine  Coffee or tea Mayonnaise   Ketchup, mustard   Apple sauce   SAMPLE MENU:  PUREED DIET Breakfast Lunch Dinner   Orange juice, 1/2 cup  Cream of wheat, 1/2 cup  Pineapple juice, 1/2 cup  Pureed [place], barley soup, 3/4 cup  Pureed Hawaiian chicken, 3 oz   Scrambled eggs, mashed or blended with cheese, 1/2 cup  Tea or coffee, 1 cup   Whole milk, 1 cup   Non-dairy creamer, 2 Tbsp.  Mashed potatoes, 1/2 cup  Pureed cooled broccoli, 1/2 cup  Apple sauce, 1/2 cup  Coffee or tea  Mashed potatoes, 1/2 cup  Pureed spinach, 1/2 cup  Frozen yogurt, 1/2 cup  Tea or coffee      LEVEL 2 = SOFT DIET  After your first 3 weeks, you can advance to a soft diet.   Keep on this diet until everything goes down easily.  Hot Foods Cold Foods  White fish Cottage cheese  Stuffed fish Junior baby fruit  Baby food meals Semi thickened juices  Minced soft cooked, scrambled, poached eggs nectars  Souffle & omelets Ripe mashed bananas  Cooked cereals Canned fruit, pineapple sauce, milk  potatoes Milkshake  Buttered or Alfredo noodles Custard  Cooked cooled vegetable Puddings, including tapioca  Sherbet Yogurt  Vegetable soup or alphabet soup Fruit ice, [place] ice    Gravies Whipped gelatin  Sugar, syrup, honey, jelly Junior baby desserts  Sauces:  Cheese, creamed, barbecue, tomato, white Cream  Coffee or tea Margarine   SAMPLE MENU:  LEVEL 2 Breakfast Lunch Dinner   Orange juice, 1/2 cup  Oatmeal, 1/2 cup  Scrambled eggs with cheese, 1/2 cup  Decaffeinated tea, 1 cup  Whole milk, 1 cup  Non-dairy creamer, 2 Tbsp  Pineapple juice, 1/2 cup  Minced beef, 3 oz  Gravy, 2 Tbsp  Mashed potatoes, 1/2 cup  Minced fresh broccoli, 1/2 cup  Applesauce, 1/2 cup  Coffee, 1 cup  [place], barley soup, 3/4 cup  Minced Hawaiian chicken, 3 oz  Mashed potatoes, 1/2 cup  Cooked spinach, 1/2 cup  Frozen yogurt, 1/2 cup  Non-dairy creamer, 2 Tbsp      LEVEL 3 = CHOPPED DIET  -After all the foods in level 2 (soft diet) are passing through well you should advance up to more chopped foods.  -It is still important to cut these foods into small pieces and eat slowly.  Hot Foods Cold Foods  Poultry Cottage cheese  Chopped Swedish meatballs Yogurt  Meat salads (ground or flaked meat) Milk  Flaked fish (tuna) Milkshakes  Poached or scrambled eggs Soft, cold, dry cereal  Souffles and omelets Fruit juices or nectars  Cooked cereals Chopped canned fruit  Chopped [place] toast or pancakes Canned fruit cocktail  Noodles or pasta (no rice) Pudding, mousse, custard  Cooked vegetables (no frozen peas, corn, or mixed vegetables) Green salad  Canned small sweet peas Ice cream  Creamed soup or vegetable soup Fruit ice, [place] ice  Pureed vegetable soup or alphabet soup Non-dairy creamer  Ground scalloped apples Margarine  Gravies Mayonnaise  Sauces:  Cheese, creamed, barbecue, tomato, white Ketchup  Coffee or tea Mustard   SAMPLE MENU:  LEVEL 3 Breakfast Lunch Dinner   Orange juice, 1/2 cup  Oatmeal, 1/2 cup  Scrambled eggs with cheese, 1/2 cup  Decaffeinated tea, 1 cup  Whole milk, 1 cup  Non-dairy creamer, 2 Tbsp  Ketchup, 1  Tbsp  Margarine, 1 tsp  Salt, 1/4 tsp  Sugar, 2 tsp  Pineapple juice, 1/2 cup  Ground beef, 3 oz  Gravy, 2 Tbsp  Mashed potatoes, 1/2 cup  Cooked spinach, 1/2 cup  Applesauce, 1/2 cup  Decaffeinated coffee  Whole milk  Non-dairy creamer, 2 Tbsp  Margarine, 1 tsp  Salt, 1/4 tsp  Pureed [place], barley soup, 3/4 cup  Barbecue chicken, 3 oz  Mashed potatoes, 1/2 cup  Ground fresh broccoli, 1/2 cup  Frozen yogurt, 1/2 cup  Decaffeinated tea, 1 cup  Non-dairy creamer, 2 Tbsp  Margarine, 1 tsp  Salt, 1/4 tsp  Sugar, 1 tsp    LEVEL 4:  REGULAR FOODS  -Foods in this group are soft, moist, regularly textured foods.   -This level includes meat and breads, which tend to be the hardest things to swallow.   -Eat very slowly, chew well and continue to avoid carbonated drinks. -most people are at this level in 4-6 weeks  Hot Foods Cold Foods  Baked fish or skinned Soft cheeses - cottage cheese  Souffles and omelets Cream cheese  Eggs Yogurt  Stuffed shells Milk  Spaghetti with meat sauce Milkshakes  Cooked cereal Cold dry cereals (no nuts, dried fruit, coconut)  [place] toast or pancakes Crackers  Buttered toast Fruit juices or nectars  Noodles or pasta (no rice) Canned fruit  Potatoes (all types) Ripe bananas  Soft, cooked vegetables (no corn, lima, or baked beans) Peeled, ripe, fresh fruit  Creamed soups or vegetable soup Cakes (no nuts, dried fruit, coconut)  Canned chicken noodle soup Plain doughnuts  Gravies Ice cream  Bacon dressing Pudding, mousse, custard  Sauces:  Cheese, creamed, barbecue, tomato, white Fruit ice, [place] ice, sherbet  Decaffeinated tea or coffee Whipped gelatin  Pork chops Regular gelatin   Canned fruited gelatin molds   Sugar, syrup, honey, jam, jelly   Cream   Non-dairy   Margarine   Oil   Mayonnaise   Ketchup  Mustard   TROUBLESHOOTING IRREGULAR BOWELS  1) Avoid extremes of bowel movements (no bad  constipation/diarrhea)  2) Miralax 17gm mixed in 8oz. water or juice-daily. May use BID as needed.  3) Gas-x,Phazyme, etc. as needed for gas & bloating.  4) Soft,bland diet. No spicy,greasy,fried foods.  5) Prilosec over-the-counter as needed  6) May hold gluten/wheat products from diet to see if symptoms improve.  7) May try probiotics (Align, Activa, etc) to help calm the bowels down  7) If symptoms become worse call back immediately.    If you have any questions please call our office at [place]: [phone].

## 2019-03-28 NOTE — Discharge Summary (Signed)
Physician Discharge Summary  Sarah [name] GYI:[id number] DOB: April 15, 1951 DOA: 03/27/2019  PCP: Sharilyn Sites, MD  Admit date: 03/27/2019 Discharge date: 03/28/2019  Recommendations for Outpatient Follow-up:   Follow-up Information    Greer Pickerel, MD. Schedule an appointment as soon as possible for a visit in 3 week(s).   Specialty: General Surgery Why: for postop appt Contact information: [address] [place] [address] [phone]          Discharge Diagnoses:  Active Problems:   S/P Nissen fundoplication (without gastrostomy tube) procedure GERD/recurrent hiatal hernia, h/o slipped nissen  Surgical Procedure: Laparoscopic repair of recurrent hiatal hernia and redo Nissen fundoplication, upper endoscopy  Discharge Condition: Good Disposition: Home  Diet recommendation: Full liquids  Filed Weights   03/27/19 0552  Weight: 74.8 kg     Hospital Course:  The patient was admitted for a planned attempts at repair of her slipped Nissen fundoplication from the 6967E.  She was found to have a recurrent hiatal hernia and a slipped wrap.  Please see operative note for further details.  ERAS protocol was used. She was given subcutaneous heparin prior to surgery.  Chemical DVT prophylaxis was continued after surgery.  She was started on liquid several hours after surgery which she tolerated.  She had minimal nausea and pain.  On postoperative day 1 she was tolerating a liquid diet.  Her white count did bump slightly however she had no fever or tachycardia.  She looked well.  An upper GI was performed to evaluate and document her anatomy after her revision.  There is no evidence of leak.  She was tolerating a diet felt safe for discharge.  We discussed discharge instructions including diet progression.  BP 125/74 (BP Location: Left Arm)   Pulse 67   Temp 97.9 F (36.6 C) (Oral)   Resp 16   Ht 5\' 2"  (1.575 m)   Wt 74.8 kg   SpO2 94%   BMI 30.18 kg/m   Gen:  alert, NAD, non-toxic appearing Pupils: equal, no scleral icterus Pulm: Lungs clear to auscultation, symmetric chest rise CV: regular rate and rhythm Abd: soft, min tender; nondistended. . No cellulitis. No incisional hernia Ext: no edema, no calf tenderness Skin: no rash, no jaundice   Discharge Instructions  Discharge Instructions    Call MD for:   Complete by: As directed    Temperature >101   Call MD for:  hives   Complete by: As directed    Call MD for:  persistant dizziness or light-headedness   Complete by: As directed    Call MD for:  persistant nausea and vomiting   Complete by: As directed    Call MD for:  redness, tenderness, or signs of infection (pain, swelling, redness, odor or green/yellow discharge around incision site)   Complete by: As directed    Call MD for:  severe uncontrolled pain   Complete by: As directed    Diet full liquid   Complete by: As directed    Discharge instructions   Complete by: As directed    See [facility] discharge instructions   Increase activity slowly   Complete by: As directed      Allergies as of 03/28/2019      Reactions   Penicillins Itching   Has patient had a PCN reaction causing immediate rash, facial/tongue/throat swelling, SOB or lightheadedness with hypotension: Yes Has patient had a PCN reaction causing severe rash involving mucus membranes or skin necrosis: No Has  patient had a PCN reaction that required hospitalization No Has patient had a PCN reaction occurring within the last 10 years: No If all of the above answers are "NO", then may proceed with Cephalosporin use.      Medication List    STOP taking these medications   acetaminophen 650 MG CR tablet Commonly known as: TYLENOL Replaced by: acetaminophen 325 MG tablet   lisinopril 10 MG tablet Commonly known as: ZESTRIL   oxyCODONE-acetaminophen 5-325 MG tablet Commonly known as: PERCOCET/ROXICET     TAKE these medications   acetaminophen 325 MG  tablet Commonly known as: Tylenol Take 2 tablets (650 mg total) by mouth every 6 (six) hours for 5 days. Replaces: acetaminophen 650 MG CR tablet   esomeprazole 40 MG capsule Commonly known as: NEXIUM Take 40 mg by mouth daily.   estradiol 1 MG tablet Commonly known as: ESTRACE TAKE 1 TABLET BY MOUTH EVERY DAY What changed:   how much to take  how to take this  when to take this  additional instructions   hydrochlorothiazide 25 MG tablet Commonly known as: HYDRODIURIL Take 25 mg by mouth daily.   oxyCODONE 5 MG immediate release tablet Commonly known as: Oxy IR/ROXICODONE Take 1 tablet (5 mg total) by mouth every 6 (six) hours as needed for severe pain.      Follow-up Information    Greer Pickerel, MD. Schedule an appointment as soon as possible for a visit in 3 week(s).   Specialty: General Surgery Why: for postop appt Contact information: [address] [place] [address] [phone]            The results of significant diagnostics from this hospitalization (including imaging, microbiology, ancillary and laboratory) are listed below for reference.    Significant Diagnostic Studies: Ct Abdomen Pelvis W Contrast  Result Date: 02/28/2019 CLINICAL DATA:  Gastroesophageal reflux, prior laparoscopic fundoplication. EXAM: CT ABDOMEN AND PELVIS WITH CONTRAST TECHNIQUE: Multidetector CT imaging of the abdomen and pelvis was performed using the standard protocol following bolus administration of intravenous contrast. CONTRAST:  167mL ISOVUE-300 IOPAMIDOL (ISOVUE-300) INJECTION 61% COMPARISON:  06/02/2009 CT abdomen; esophagram from 12/16/2018 FINDINGS: Lower chest: No significant degree of distal esophageal wall thickening. Hepatobiliary: Prior cholecystectomy.  Otherwise unremarkable. Pancreas: Unremarkable Spleen: Unremarkable Adrenals/Urinary Tract: 5 mm hypodense lesion posteriorly along the left mid upper kidney, technically too small to characterize  although statistically highly likely to be benign. The adrenal glands appear normal. Stomach/Bowel: The fundoplication appears at least partially unwrapped, with a diverticular like outpouching extending posterior to the proximal stomach on image 16/2, similar but not as distended to the appearance on 06/02/2009. Sigmoid colon diverticulosis with scattered diverticula elsewhere in the colon. Vascular/Lymphatic: Aortoiliac atherosclerotic vascular disease. No pathologic adenopathy. Reproductive: Small cystic lesion along the left upper margin of the vaginal cuff, currently measuring about 2.3 by 1.2 by 1.9 cm, and previously about 1.9 by 1.0 by 0.8 cm back on 06/02/2009. I favor this as being a small incidental benign postoperative cystic lesion or Gartner duct cyst. The uterus is absent. Other: No supplemental non-categorized findings. Musculoskeletal: No significant lumbar impingement. Small umbilical hernia contains adipose tissue. IMPRESSION: 1. Patient's fundoplication is likely partially unwrapped, with the wrap extending posterior to the gastric fundus but not appearing to tightly wraparound the GE junction. However, this is a chronic appearance and not dissimilar to the 2010 exam. 2. No significant distal esophageal wall thickening is currently apparent. 3. Small likely benign cystic lesion along the vaginal  cuff is larger than in 2010 but highly likely to be benign. 4. Sigmoid colon diverticulosis. 5.  Aortic Atherosclerosis (ICD10-I70.0). Electronically Signed   By: Van Clines M.D.   On: 02/28/2019 16:43   Dg Duanne Limerick W Single Cm (sol Or Thin Ba)  Result Date: 03/28/2019 CLINICAL DATA:  68 year old female postoperative day 1 status post redo Nissen fundoplication. Tolerating p.o. liquids to this point. EXAM: UPPER GI SERIES WITHOUT KUB TECHNIQUE: Routine upper GI series was performed with 50 milliliters Omnipaque 300 FLUOROSCOPY TIME:  Fluoroscopy Time:  0 minutes 36 seconds Radiation Exposure Index  (if provided by the fluoroscopic device): 8.7 mGy Number of Acquired Spot Images: 0 COMPARISON:  CT Abdomen and Pelvis 05/09/2025. FINDINGS: A single contrast study was undertaken with water-soluble contrast and the patient tolerated this well and without difficulty. No obstruction to the forward flow of contrast throughout the esophagus and into the stomach. Satisfactory postoperative appearance of the gastroesophageal junction with no delay in contrast passage (series 2, image 48). No contrast leak. Mild gas distended stomach. IMPRESSION: Satisfactory postoperative appearance of redo-Nissen. Electronically Signed   By: Genevie Ann M.D.   On: 03/28/2019 11:28    Labs: Basic Metabolic Panel: Recent Labs  Lab 03/23/19 1024 03/28/19 0440  NA 140 131*  K 3.5 4.0  CL 105 101  CO2 24 21*  GLUCOSE 96 148*  BUN 20 17  CREATININE 0.72 0.73  CALCIUM 9.3 7.4*   Liver Function Tests: Recent Labs  Lab 03/23/19 1024 03/28/19 0440  AST 28 77*  ALT 26 63*  ALKPHOS 63 52  BILITOT 0.6 0.3  PROT 7.4 6.1*  ALBUMIN 4.1 3.2*    CBC: Recent Labs  Lab 03/23/19 1024 03/28/19 0440  WBC 8.9 15.8*  NEUTROABS 5.6 13.1*  HGB 12.8 10.7*  HCT 39.2 32.8*  MCV 91.0 93.4  PLT 237 191    CBG: No results for input(s): GLUCAP in the last 168 hours.  Active Problems:   S/P Nissen fundoplication (without gastrostomy tube) procedure   Time coordinating discharge: 15 min  Signed:  Gayland Curry, MD [facility], [place] [phone] 03/28/2019, 3:43 PM

## 2019-04-26 DIAGNOSIS — M9903 Segmental and somatic dysfunction of lumbar region: Secondary | ICD-10-CM | POA: Diagnosis not present

## 2019-04-26 DIAGNOSIS — M9902 Segmental and somatic dysfunction of thoracic region: Secondary | ICD-10-CM | POA: Diagnosis not present

## 2019-04-26 DIAGNOSIS — M9901 Segmental and somatic dysfunction of cervical region: Secondary | ICD-10-CM | POA: Diagnosis not present

## 2019-04-26 DIAGNOSIS — M5137 Other intervertebral disc degeneration, lumbosacral region: Secondary | ICD-10-CM | POA: Diagnosis not present

## 2019-04-27 DIAGNOSIS — M546 Pain in thoracic spine: Secondary | ICD-10-CM | POA: Diagnosis not present

## 2019-04-27 DIAGNOSIS — M5137 Other intervertebral disc degeneration, lumbosacral region: Secondary | ICD-10-CM | POA: Diagnosis not present

## 2019-04-27 DIAGNOSIS — M9903 Segmental and somatic dysfunction of lumbar region: Secondary | ICD-10-CM | POA: Diagnosis not present

## 2019-04-27 DIAGNOSIS — M9901 Segmental and somatic dysfunction of cervical region: Secondary | ICD-10-CM | POA: Diagnosis not present

## 2019-04-27 DIAGNOSIS — Z1389 Encounter for screening for other disorder: Secondary | ICD-10-CM | POA: Diagnosis not present

## 2019-04-27 DIAGNOSIS — Z23 Encounter for immunization: Secondary | ICD-10-CM | POA: Diagnosis not present

## 2019-04-27 DIAGNOSIS — M9902 Segmental and somatic dysfunction of thoracic region: Secondary | ICD-10-CM | POA: Diagnosis not present

## 2019-04-27 DIAGNOSIS — Z6828 Body mass index (BMI) 28.0-28.9, adult: Secondary | ICD-10-CM | POA: Diagnosis not present

## 2019-04-27 DIAGNOSIS — E663 Overweight: Secondary | ICD-10-CM | POA: Diagnosis not present

## 2019-04-27 DIAGNOSIS — M62838 Other muscle spasm: Secondary | ICD-10-CM | POA: Diagnosis not present

## 2019-05-08 DIAGNOSIS — E663 Overweight: Secondary | ICD-10-CM | POA: Diagnosis not present

## 2019-05-08 DIAGNOSIS — M5412 Radiculopathy, cervical region: Secondary | ICD-10-CM | POA: Diagnosis not present

## 2019-05-08 DIAGNOSIS — Z6827 Body mass index (BMI) 27.0-27.9, adult: Secondary | ICD-10-CM | POA: Diagnosis not present

## 2019-05-08 DIAGNOSIS — M542 Cervicalgia: Secondary | ICD-10-CM | POA: Diagnosis not present

## 2019-05-31 DIAGNOSIS — F419 Anxiety disorder, unspecified: Secondary | ICD-10-CM | POA: Diagnosis not present

## 2019-06-19 ENCOUNTER — Other Ambulatory Visit (HOSPITAL_COMMUNITY): Payer: Self-pay | Admitting: Obstetrics and Gynecology

## 2019-06-19 DIAGNOSIS — Z1231 Encounter for screening mammogram for malignant neoplasm of breast: Secondary | ICD-10-CM

## 2019-07-10 ENCOUNTER — Other Ambulatory Visit: Payer: Self-pay

## 2019-07-10 ENCOUNTER — Ambulatory Visit (HOSPITAL_COMMUNITY)
Admission: RE | Admit: 2019-07-10 | Discharge: 2019-07-10 | Disposition: A | Discharge status: Home / Self Care | Payer: PPO | Source: Ambulatory Visit | Attending: Obstetrics and Gynecology | Admitting: Obstetrics and Gynecology

## 2019-07-10 DIAGNOSIS — Z1231 Encounter for screening mammogram for malignant neoplasm of breast: Secondary | ICD-10-CM | POA: Diagnosis not present

## 2019-07-31 ENCOUNTER — Emergency Department (HOSPITAL_COMMUNITY)
Admission: EM | Admit: 2019-07-31 | Discharge: 2019-07-31 | Disposition: A | Discharge status: Home / Self Care | Payer: PPO | Attending: Emergency Medicine | Admitting: Emergency Medicine

## 2019-07-31 ENCOUNTER — Emergency Department (HOSPITAL_COMMUNITY): Payer: PPO

## 2019-07-31 ENCOUNTER — Encounter (HOSPITAL_COMMUNITY): Payer: Self-pay | Admitting: Emergency Medicine

## 2019-07-31 DIAGNOSIS — R52 Pain, unspecified: Secondary | ICD-10-CM | POA: Diagnosis not present

## 2019-07-31 DIAGNOSIS — M79604 Pain in right leg: Secondary | ICD-10-CM | POA: Diagnosis not present

## 2019-07-31 DIAGNOSIS — Y929 Unspecified place or not applicable: Secondary | ICD-10-CM | POA: Diagnosis not present

## 2019-07-31 DIAGNOSIS — M79601 Pain in right arm: Secondary | ICD-10-CM | POA: Insufficient documentation

## 2019-07-31 DIAGNOSIS — M25511 Pain in right shoulder: Secondary | ICD-10-CM | POA: Diagnosis not present

## 2019-07-31 DIAGNOSIS — S79911A Unspecified injury of right hip, initial encounter: Secondary | ICD-10-CM | POA: Diagnosis not present

## 2019-07-31 DIAGNOSIS — M25561 Pain in right knee: Secondary | ICD-10-CM | POA: Diagnosis not present

## 2019-07-31 DIAGNOSIS — R0789 Other chest pain: Secondary | ICD-10-CM | POA: Diagnosis not present

## 2019-07-31 DIAGNOSIS — R110 Nausea: Secondary | ICD-10-CM | POA: Diagnosis not present

## 2019-07-31 DIAGNOSIS — Y939 Activity, unspecified: Secondary | ICD-10-CM | POA: Diagnosis not present

## 2019-07-31 DIAGNOSIS — I10 Essential (primary) hypertension: Secondary | ICD-10-CM | POA: Diagnosis not present

## 2019-07-31 DIAGNOSIS — S199XXA Unspecified injury of neck, initial encounter: Secondary | ICD-10-CM | POA: Diagnosis not present

## 2019-07-31 DIAGNOSIS — Y999 Unspecified external cause status: Secondary | ICD-10-CM | POA: Insufficient documentation

## 2019-07-31 DIAGNOSIS — S161XXA Strain of muscle, fascia and tendon at neck level, initial encounter: Secondary | ICD-10-CM | POA: Insufficient documentation

## 2019-07-31 DIAGNOSIS — M25531 Pain in right wrist: Secondary | ICD-10-CM | POA: Diagnosis not present

## 2019-07-31 DIAGNOSIS — S0990XA Unspecified injury of head, initial encounter: Secondary | ICD-10-CM | POA: Diagnosis not present

## 2019-07-31 DIAGNOSIS — W19XXXA Unspecified fall, initial encounter: Secondary | ICD-10-CM | POA: Insufficient documentation

## 2019-07-31 DIAGNOSIS — R0902 Hypoxemia: Secondary | ICD-10-CM | POA: Diagnosis not present

## 2019-07-31 DIAGNOSIS — S299XXA Unspecified injury of thorax, initial encounter: Secondary | ICD-10-CM | POA: Diagnosis not present

## 2019-07-31 DIAGNOSIS — R0781 Pleurodynia: Secondary | ICD-10-CM | POA: Diagnosis not present

## 2019-07-31 DIAGNOSIS — M25551 Pain in right hip: Secondary | ICD-10-CM | POA: Diagnosis not present

## 2019-07-31 MED ORDER — MORPHINE SULFATE (PF) 4 MG/ML IV SOLN
4.0000 mg | Freq: Once | INTRAVENOUS | Status: AC
  Administered 2019-07-31: 18:00:00 4 mg via INTRAVENOUS
  Filled 2019-07-31: qty 1

## 2019-07-31 MED ORDER — ONDANSETRON HCL 4 MG/2ML IJ SOLN
4.0000 mg | Freq: Once | INTRAMUSCULAR | Status: AC
  Administered 2019-07-31: 18:00:00 4 mg via INTRAVENOUS
  Filled 2019-07-31: qty 2

## 2019-07-31 NOTE — ED Notes (Signed)
Spoke with pt's son Roselyn Reef, updated on pt condition and plan of care

## 2019-07-31 NOTE — Discharge Instructions (Signed)
You were seen in the emergency department today after fall.  Please take over-the-counter pain medications as needed.  Follow-up with your primary care doctor.  Return to the emergency department any new or suddenly worsening symptoms.

## 2019-07-31 NOTE — ED Notes (Signed)
Patient up needed to go to bathroom,pt slow and steady on her feet no assisting needed.
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
## 2019-07-31 NOTE — ED Notes (Signed)
Pt back from radiology. Call light given.

## 2019-07-31 NOTE — ED Triage Notes (Signed)
Patient given 10mg  of morphine en route by EMS

## 2019-07-31 NOTE — ED Triage Notes (Signed)
Patient presents to the ED via [facility] after a fall tripping on uneven concrete hitting head.  Patient complains of right hip pain.  Denies LOC. Patient is not on blood thinners.

## 2019-08-02 DIAGNOSIS — Z6826 Body mass index (BMI) 26.0-26.9, adult: Secondary | ICD-10-CM | POA: Diagnosis not present

## 2019-08-02 DIAGNOSIS — E663 Overweight: Secondary | ICD-10-CM | POA: Diagnosis not present

## 2019-08-02 DIAGNOSIS — G894 Chronic pain syndrome: Secondary | ICD-10-CM | POA: Diagnosis not present

## 2019-08-02 DIAGNOSIS — F419 Anxiety disorder, unspecified: Secondary | ICD-10-CM | POA: Diagnosis not present

## 2019-08-02 DIAGNOSIS — Z23 Encounter for immunization: Secondary | ICD-10-CM | POA: Diagnosis not present

## 2019-08-18 DIAGNOSIS — M25551 Pain in right hip: Secondary | ICD-10-CM | POA: Diagnosis not present

## 2019-09-13 DIAGNOSIS — M25551 Pain in right hip: Secondary | ICD-10-CM | POA: Diagnosis not present

## 2019-10-25 DIAGNOSIS — M25551 Pain in right hip: Secondary | ICD-10-CM | POA: Diagnosis not present

## 2019-10-30 DIAGNOSIS — M25551 Pain in right hip: Secondary | ICD-10-CM | POA: Diagnosis not present

## 2019-11-01 DIAGNOSIS — M25551 Pain in right hip: Secondary | ICD-10-CM | POA: Diagnosis not present

## 2019-11-05 DIAGNOSIS — G894 Chronic pain syndrome: Secondary | ICD-10-CM | POA: Diagnosis not present

## 2019-11-05 DIAGNOSIS — I10 Essential (primary) hypertension: Secondary | ICD-10-CM | POA: Diagnosis not present

## 2019-11-05 DIAGNOSIS — F4542 Pain disorder with related psychological factors: Secondary | ICD-10-CM | POA: Diagnosis not present

## 2019-11-05 DIAGNOSIS — E7849 Other hyperlipidemia: Secondary | ICD-10-CM | POA: Diagnosis not present

## 2019-11-08 DIAGNOSIS — E7849 Other hyperlipidemia: Secondary | ICD-10-CM | POA: Diagnosis not present

## 2019-11-08 DIAGNOSIS — Z6826 Body mass index (BMI) 26.0-26.9, adult: Secondary | ICD-10-CM | POA: Diagnosis not present

## 2019-11-08 DIAGNOSIS — R7309 Other abnormal glucose: Secondary | ICD-10-CM | POA: Diagnosis not present

## 2019-11-08 DIAGNOSIS — I10 Essential (primary) hypertension: Secondary | ICD-10-CM | POA: Diagnosis not present

## 2019-11-08 DIAGNOSIS — I639 Cerebral infarction, unspecified: Secondary | ICD-10-CM | POA: Diagnosis not present

## 2019-11-08 DIAGNOSIS — G8929 Other chronic pain: Secondary | ICD-10-CM | POA: Diagnosis not present

## 2019-11-08 DIAGNOSIS — Z0000 Encounter for general adult medical examination without abnormal findings: Secondary | ICD-10-CM | POA: Diagnosis not present

## 2019-11-08 DIAGNOSIS — Z1389 Encounter for screening for other disorder: Secondary | ICD-10-CM | POA: Diagnosis not present

## 2019-11-08 DIAGNOSIS — M797 Fibromyalgia: Secondary | ICD-10-CM | POA: Diagnosis not present

## 2019-11-08 DIAGNOSIS — F419 Anxiety disorder, unspecified: Secondary | ICD-10-CM | POA: Diagnosis not present

## 2019-12-11 DIAGNOSIS — M25551 Pain in right hip: Secondary | ICD-10-CM | POA: Diagnosis not present

## 2020-01-29 DIAGNOSIS — R69 Illness, unspecified: Secondary | ICD-10-CM | POA: Diagnosis not present

## 2020-02-02 DIAGNOSIS — R7309 Other abnormal glucose: Secondary | ICD-10-CM | POA: Diagnosis not present

## 2020-02-02 DIAGNOSIS — G894 Chronic pain syndrome: Secondary | ICD-10-CM | POA: Diagnosis not present

## 2020-02-02 DIAGNOSIS — I10 Essential (primary) hypertension: Secondary | ICD-10-CM | POA: Diagnosis not present

## 2020-02-02 DIAGNOSIS — F4542 Pain disorder with related psychological factors: Secondary | ICD-10-CM | POA: Diagnosis not present

## 2020-02-07 DIAGNOSIS — M25551 Pain in right hip: Secondary | ICD-10-CM | POA: Diagnosis not present

## 2020-03-06 DIAGNOSIS — M25551 Pain in right hip: Secondary | ICD-10-CM | POA: Diagnosis not present

## 2020-03-15 DIAGNOSIS — Z6827 Body mass index (BMI) 27.0-27.9, adult: Secondary | ICD-10-CM | POA: Diagnosis not present

## 2020-03-15 DIAGNOSIS — R7309 Other abnormal glucose: Secondary | ICD-10-CM | POA: Diagnosis not present

## 2020-03-15 DIAGNOSIS — M797 Fibromyalgia: Secondary | ICD-10-CM | POA: Diagnosis not present

## 2020-03-15 DIAGNOSIS — I10 Essential (primary) hypertension: Secondary | ICD-10-CM | POA: Diagnosis not present

## 2020-03-15 DIAGNOSIS — Z01810 Encounter for preprocedural cardiovascular examination: Secondary | ICD-10-CM | POA: Diagnosis not present

## 2020-03-15 DIAGNOSIS — E7849 Other hyperlipidemia: Secondary | ICD-10-CM | POA: Diagnosis not present

## 2020-03-15 DIAGNOSIS — R69 Illness, unspecified: Secondary | ICD-10-CM | POA: Diagnosis not present

## 2020-03-27 DIAGNOSIS — G8929 Other chronic pain: Secondary | ICD-10-CM | POA: Diagnosis not present

## 2020-03-27 DIAGNOSIS — I10 Essential (primary) hypertension: Secondary | ICD-10-CM | POA: Diagnosis not present

## 2020-03-27 DIAGNOSIS — R69 Illness, unspecified: Secondary | ICD-10-CM | POA: Diagnosis not present

## 2020-03-27 DIAGNOSIS — M810 Age-related osteoporosis without current pathological fracture: Secondary | ICD-10-CM | POA: Diagnosis not present

## 2020-03-27 DIAGNOSIS — R269 Unspecified abnormalities of gait and mobility: Secondary | ICD-10-CM | POA: Diagnosis not present

## 2020-03-27 DIAGNOSIS — M1990 Unspecified osteoarthritis, unspecified site: Secondary | ICD-10-CM | POA: Diagnosis not present

## 2020-03-27 DIAGNOSIS — Z780 Asymptomatic menopausal state: Secondary | ICD-10-CM | POA: Diagnosis not present

## 2020-03-27 DIAGNOSIS — R32 Unspecified urinary incontinence: Secondary | ICD-10-CM | POA: Diagnosis not present

## 2020-03-27 DIAGNOSIS — R609 Edema, unspecified: Secondary | ICD-10-CM | POA: Diagnosis not present

## 2020-03-27 DIAGNOSIS — J302 Other seasonal allergic rhinitis: Secondary | ICD-10-CM | POA: Diagnosis not present

## 2020-04-09 ENCOUNTER — Encounter: Payer: Self-pay | Admitting: Orthopedic Surgery

## 2020-04-09 DIAGNOSIS — M1611 Unilateral primary osteoarthritis, right hip: Secondary | ICD-10-CM

## 2020-04-09 DIAGNOSIS — M25551 Pain in right hip: Secondary | ICD-10-CM | POA: Diagnosis not present

## 2020-04-09 HISTORY — DX: Unilateral primary osteoarthritis, right hip: M16.11
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
## 2020-04-17 NOTE — Patient Instructions (Addendum)
DUE TO COVID-19 ONLY ONE VISITOR IS ALLOWED TO COME WITH YOU AND STAY IN THE WAITING ROOM ONLY DURING PRE OP AND PROCEDURE DAY OF SURGERY. THE 1 VISITOR MAY VISIT WITH YOU AFTER SURGERY IN YOUR PRIVATE ROOM DURING VISITING HOURS ONLY!  YOU NEED TO HAVE A COVID 19 TEST ON: 04/26/20 @ 9:00 am , THIS TEST MUST BE DONE BEFORE SURGERY, COME  [address], [address] [place] , [id number].  ([place]) ONCE YOUR COVID TEST IS COMPLETED, PLEASE BEGIN THE QUARANTINE INSTRUCTIONS AS OUTLINED IN YOUR HANDOUT.                Sarah [name]     Your procedure is scheduled on: 04/30/20   Report to [facility] Main  Entrance   Report to short stay at: 5:30 AM     Call this number if you have problems the morning of surgery [phone]    Remember:  NO SOLID FOOD AFTER MIDNIGHT THE NIGHT PRIOR TO SURGERY. NOTHING BY MOUTH EXCEPT CLEAR LIQUIDS UNTIL: 4:30 am . PLEASE FINISH GATORADE DRINK PER SURGEON ORDER  WHICH NEEDS TO BE COMPLETED AT: 4:30 am .   CLEAR LIQUID DIET   Foods Allowed                                                                     Foods Excluded  Coffee and tea, regular and decaf                             liquids that you cannot  Plain Jell-O any favor except red or purple                                           see through such as: Fruit ices (not with fruit pulp)                                     milk, soups, orange juice  Iced Popsicles                                    All solid food Carbonated beverages, regular and diet                                    Cranberry, grape and apple juices Sports drinks like Gatorade Lightly seasoned clear broth or consume(fat free) Sugar, honey syrup  Sample Menu Breakfast                                Lunch                                     Supper Cranberry juice  Beef broth                            Chicken broth Jell-O                                     Grape juice                            Apple juice Coffee or tea                        Jell-O                                      Popsicle                                                Coffee or tea                        Coffee or tea  _____________________________________________________________________   BRUSH YOUR TEETH MORNING OF SURGERY AND RINSE YOUR MOUTH OUT, NO CHEWING GUM CANDY OR MINTS.     Take these medicines the morning of surgery with A SIP OF WATER: estradiol.Alprazolam as needed.                                You may not have any metal on your body including hair pins and              piercings  Do not wear jewelry, make-up, lotions, powders or perfumes, deodorant             Do not wear nail polish on your fingernails.  Do not shave  48 hours prior to surgery.              Do not bring valuables to the hospital. [place].  Contacts, dentures or bridgework may not be worn into surgery.  Leave suitcase in the car. After surgery it may be brought to your room.     Patients discharged the day of surgery will not be allowed to drive home. IF YOU ARE HAVING SURGERY AND GOING HOME THE SAME DAY, YOU MUST HAVE AN ADULT TO DRIVE YOU HOME AND BE WITH YOU FOR 24 HOURS. YOU MAY GO HOME BY TAXI OR UBER OR ORTHERWISE, BUT AN ADULT MUST ACCOMPANY YOU HOME AND STAY WITH YOU FOR 24 HOURS.  Name and phone number of your driver:  Special Instructions: N/A              Please read over the following fact sheets you were given: _____________________________________________________________________          [facility] - Preparing for Surgery Before surgery, you can play an important role.  Because skin is not sterile, your skin needs to be as free of germs as possible.  You can reduce the number of germs on your  skin by washing with CHG (chlorahexidine gluconate) soap before surgery.  CHG is an antiseptic cleaner which kills germs and bonds with the skin to continue  killing germs even after washing. Please DO NOT use if you have an allergy to CHG or antibacterial soaps.  If your skin becomes reddened/irritated stop using the CHG and inform your nurse when you arrive at Short Stay. Do not shave (including legs and underarms) for at least 48 hours prior to the first CHG shower.  You may shave your face/neck. Please follow these instructions carefully:  1.  Shower with CHG Soap the night before surgery and the  morning of Surgery.  2.  If you choose to wash your hair, wash your hair first as usual with your  normal  shampoo.  3.  After you shampoo, rinse your hair and body thoroughly to remove the  shampoo.                           4.  Use CHG as you would any other liquid soap.  You can apply chg directly  to the skin and wash                       Gently with a scrungie or clean washcloth.  5.  Apply the CHG Soap to your body ONLY FROM THE NECK DOWN.   Do not use on face/ open                           Wound or open sores. Avoid contact with eyes, ears mouth and genitals (private parts).                       Wash face,  Genitals (private parts) with your normal soap.             6.  Wash thoroughly, paying special attention to the area where your surgery  will be performed.  7.  Thoroughly rinse your body with warm water from the neck down.  8.  DO NOT shower/wash with your normal soap after using and rinsing off  the CHG Soap.                9.  Pat yourself dry with a clean towel.            10.  Wear clean pajamas.            11.  Place clean sheets on your bed the night of your first shower and do not  sleep with pets. Day of Surgery : Do not apply any lotions/deodorants the morning of surgery.  Please wear clean clothes to the hospital/surgery center.  FAILURE TO FOLLOW THESE INSTRUCTIONS MAY RESULT IN THE CANCELLATION OF YOUR SURGERY PATIENT SIGNATURE_________________________________  NURSE  SIGNATURE__________________________________  ________________________________________________________________________   Sarah [name]  An incentive spirometer is a tool that can help keep your lungs clear and active. This tool measures how well you are filling your lungs with each breath. Taking long deep breaths may help reverse or decrease the chance of developing breathing (pulmonary) problems (especially infection) following:  A long period of time when you are unable to move or be active. BEFORE THE PROCEDURE   If the spirometer includes an indicator to show your best effort, your nurse or respiratory therapist will set it to a desired goal.  If possible,  sit up straight or lean slightly forward. Try not to slouch.  Hold the incentive spirometer in an upright position. INSTRUCTIONS FOR USE  1. Sit on the edge of your bed if possible, or sit up as far as you can in bed or on a chair. 2. Hold the incentive spirometer in an upright position. 3. Breathe out normally. 4. Place the mouthpiece in your mouth and seal your lips tightly around it. 5. Breathe in slowly and as deeply as possible, raising the piston or the ball toward the top of the column. 6. Hold your breath for 3-5 seconds or for as long as possible. Allow the piston or ball to fall to the bottom of the column. 7. Remove the mouthpiece from your mouth and breathe out normally. 8. Rest for a few seconds and repeat Steps 1 through 7 at least 10 times every 1-2 hours when you are awake. Take your time and take a few normal breaths between deep breaths. 9. The spirometer may include an indicator to show your best effort. Use the indicator as a goal to work toward during each repetition. 10. After each set of 10 deep breaths, practice coughing to be sure your lungs are clear. If you have an incision (the cut made at the time of surgery), support your incision when coughing by placing a pillow or rolled up towels firmly  against it. Once you are able to get out of bed, walk around indoors and cough well. You may stop using the incentive spirometer when instructed by your caregiver.  RISKS AND COMPLICATIONS  Take your time so you do not get dizzy or light-headed.  If you are in pain, you may need to take or ask for pain medication before doing incentive spirometry. It is harder to take a deep breath if you are having pain. AFTER USE  Rest and breathe slowly and easily.  It can be helpful to keep track of a log of your progress. Your caregiver can provide you with a simple table to help with this. If you are using the spirometer at home, follow these instructions: [place] IF:   You are having difficultly using the spirometer.  You have trouble using the spirometer as often as instructed.  Your pain medication is not giving enough relief while using the spirometer.  You develop fever of 100.5 F (38.1 C) or higher. SEEK IMMEDIATE MEDICAL CARE IF:   You cough up bloody sputum that had not been present before.  You develop fever of 102 F (38.9 C) or greater.  You develop worsening pain at or near the incision site. MAKE SURE YOU:   Understand these instructions.  Will watch your condition.  Will get help right away if you are not doing well or get worse. Document Released: 02/01/2007 Document Revised: 12/14/2011 Document Reviewed: 04/04/2007 [facility] Patient Information 2014 [place], [place].   ________________________________________________________________________

## 2020-04-18 ENCOUNTER — Encounter (HOSPITAL_COMMUNITY): Payer: Self-pay

## 2020-04-18 ENCOUNTER — Encounter (HOSPITAL_COMMUNITY)
Admission: RE | Admit: 2020-04-18 | Discharge: 2020-04-18 | Disposition: A | Discharge status: Home / Self Care | Payer: Medicare HMO | Source: Ambulatory Visit | Attending: Orthopedic Surgery | Admitting: Orthopedic Surgery

## 2020-04-18 ENCOUNTER — Other Ambulatory Visit: Payer: Self-pay

## 2020-04-18 DIAGNOSIS — Z01818 Encounter for other preprocedural examination: Secondary | ICD-10-CM | POA: Diagnosis not present

## 2020-04-18 LAB — CBC WITH DIFFERENTIAL/PLATELET
Abs Immature Granulocytes: 0.03 10*3/uL (ref 0.00–0.07)
Basophils Absolute: 0.1 10*3/uL (ref 0.0–0.1)
Basophils Relative: 1 %
Eosinophils Absolute: 0.1 10*3/uL (ref 0.0–0.5)
Eosinophils Relative: 1 %
HCT: 38.7 % (ref 36.0–46.0)
Hemoglobin: 13.2 g/dL (ref 12.0–15.0)
Immature Granulocytes: 0 %
Lymphocytes Relative: 27 %
Lymphs Abs: 3.2 10*3/uL (ref 0.7–4.0)
MCH: 30.9 pg (ref 26.0–34.0)
MCHC: 34.1 g/dL (ref 30.0–36.0)
MCV: 90.6 fL (ref 80.0–100.0)
Monocytes Absolute: 0.6 10*3/uL (ref 0.1–1.0)
Monocytes Relative: 5 %
Neutro Abs: 7.8 10*3/uL — ABNORMAL HIGH (ref 1.7–7.7)
Neutrophils Relative %: 66 %
Platelets: 269 10*3/uL (ref 150–400)
RBC: 4.27 MIL/uL (ref 3.87–5.11)
RDW: 12.6 % (ref 11.5–15.5)
WBC: 11.8 10*3/uL — ABNORMAL HIGH (ref 4.0–10.5)
nRBC: 0 % (ref 0.0–0.2)

## 2020-04-18 LAB — COMPREHENSIVE METABOLIC PANEL
ALT: 17 U/L (ref 0–44)
AST: 19 U/L (ref 15–41)
Albumin: 4.5 g/dL (ref 3.5–5.0)
Alkaline Phosphatase: 57 U/L (ref 38–126)
Anion gap: 10 (ref 5–15)
BUN: 23 mg/dL (ref 8–23)
CO2: 26 mmol/L (ref 22–32)
Calcium: 9.4 mg/dL (ref 8.9–10.3)
Chloride: 104 mmol/L (ref 98–111)
Creatinine, Ser: 0.67 mg/dL (ref 0.44–1.00)
GFR calc Af Amer: >60 mL/min (ref >60)
GFR calc non Af Amer: >60 mL/min (ref >60)
Glucose, Bld: 103 mg/dL — ABNORMAL HIGH (ref 70–99)
Potassium: 3.7 mmol/L (ref 3.5–5.1)
Sodium: 140 mmol/L (ref 135–145)
Total Bilirubin: 0.5 mg/dL (ref 0.3–1.2)
Total Protein: 7.7 g/dL (ref 6.5–8.1)

## 2020-04-18 LAB — APTT: aPTT: 28 seconds (ref 24–36)

## 2020-04-18 LAB — PROTIME-INR
INR: 1 (ref 0.8–1.2)
Prothrombin Time: 12.4 seconds (ref 11.4–15.2)

## 2020-04-18 LAB — HEMOGLOBIN A1C
Hgb A1c MFr Bld: 5.8 % — ABNORMAL HIGH (ref 4.8–5.6)
Mean Plasma Glucose: 119.76 mg/dL

## 2020-04-18 LAB — SURGICAL PCR SCREEN
MRSA, PCR: NEGATIVE
Staphylococcus aureus: NEGATIVE

## 2020-04-18 NOTE — Progress Notes (Signed)
COVID Vaccine Completed:yes Date COVID Vaccine completed:12/27/19 COVID vaccine manufacturer: [place]   PCP -Dr. Sharilyn Sites.  Cardiologist - No  Chest x-ray -  EKG -  Stress Test -  ECHO - 2017 Cardiac Cath -   Sleep Study -  CPAP -   Fasting Blood Sugar -  Checks Blood Sugar _____ times a day  Blood Thinner Instructions: Aspirin Instructions: Last Dose:  Anesthesia review: Hx: troke  Patient denies shortness of breath, fever, cough and chest pain at PAT appointment   Patient verbalized understanding of instructions that were given to them at the PAT appointment. Patient was also instructed that they will need to review over the PAT instructions again at home before surgery.

## 2020-04-19 LAB — URINE CULTURE: Culture: NO GROWTH

## 2020-04-26 ENCOUNTER — Other Ambulatory Visit (HOSPITAL_COMMUNITY)
Admission: RE | Admit: 2020-04-26 | Discharge: 2020-04-26 | Disposition: A | Discharge status: Home / Self Care | Payer: Medicare HMO | Source: Ambulatory Visit | Attending: Orthopedic Surgery | Admitting: Orthopedic Surgery

## 2020-04-26 DIAGNOSIS — Z20822 Contact with and (suspected) exposure to covid-19: Secondary | ICD-10-CM | POA: Insufficient documentation

## 2020-04-26 DIAGNOSIS — Z01812 Encounter for preprocedural laboratory examination: Secondary | ICD-10-CM | POA: Diagnosis not present

## 2020-04-26 LAB — SARS CORONAVIRUS 2 (TAT 6-24 HRS): SARS Coronavirus 2: NEGATIVE

## 2020-04-29 MED ORDER — BUPIVACAINE LIPOSOME 1.3 % IJ SUSP
10.0000 mL | Freq: Once | INTRAMUSCULAR | Status: DC
  Filled 2020-04-29: qty 10

## 2020-04-29 NOTE — Anesthesia Preprocedure Evaluation (Addendum)
Anesthesia Evaluation  Patient identified by MRN, date of birth, ID band Patient awake    Reviewed: Allergy & Precautions, NPO status , Patient's Chart, lab work & pertinent test results  History of Anesthesia Complications Negative for: history of anesthetic complications  Airway Mallampati: II  TM Distance: >3 FB Neck ROM: Full    Dental  (+) Dental Advisory Given   Pulmonary neg pulmonary ROS,  04/26/2020 SARS coronavirus NEG   breath sounds clear to auscultation       Cardiovascular hypertension, Pt. on medications (-) angina Rhythm:Regular Rate:Normal  '17 ECHO: EF 55-60%, valves OK   Neuro/Psych  Headaches, TIA   GI/Hepatic negative GI ROS, Neg liver ROS,   Endo/Other  negative endocrine ROS  Renal/GU negative Renal ROS     Musculoskeletal  (+) Arthritis , Osteoarthritis,    Abdominal   Peds  Hematology negative hematology ROS (+) plt 269K, INR 1.0   Anesthesia Other Findings   Reproductive/Obstetrics                            Anesthesia Physical Anesthesia Plan  ASA: II  Anesthesia Plan: Spinal   Post-op Pain Management:    Induction:   PONV Risk Score and Plan: 2 and Ondansetron and Treatment may vary due to age or medical condition  Airway Management Planned: Natural Airway and Simple Face Mask  Additional Equipment: None  Intra-op Plan:   Post-operative Plan:   Informed Consent: I have reviewed the patients History and Physical, chart, labs and discussed the procedure including the risks, benefits and alternatives for the proposed anesthesia with the patient or authorized representative who has indicated his/her understanding and acceptance.     Dental advisory given  Plan Discussed with: CRNA and Surgeon  Anesthesia Plan Comments:        Anesthesia Quick Evaluation

## 2020-04-30 ENCOUNTER — Encounter (HOSPITAL_COMMUNITY)
Admission: RE | Disposition: A | Discharge status: Home / Self Care | Payer: Self-pay | Source: Ambulatory Visit | Attending: Orthopedic Surgery

## 2020-04-30 ENCOUNTER — Ambulatory Visit (HOSPITAL_COMMUNITY): Payer: Medicare HMO

## 2020-04-30 ENCOUNTER — Encounter (HOSPITAL_COMMUNITY): Payer: Self-pay | Admitting: Orthopedic Surgery

## 2020-04-30 ENCOUNTER — Ambulatory Visit (HOSPITAL_COMMUNITY): Payer: Medicare HMO | Admitting: Certified Registered Nurse Anesthetist

## 2020-04-30 ENCOUNTER — Ambulatory Visit (HOSPITAL_COMMUNITY)
Admission: RE | Admit: 2020-04-30 | Discharge: 2020-04-30 | Disposition: A | Discharge status: Home / Self Care | Payer: Medicare HMO | Source: Ambulatory Visit | Attending: Orthopedic Surgery | Admitting: Orthopedic Surgery

## 2020-04-30 DIAGNOSIS — Z79899 Other long term (current) drug therapy: Secondary | ICD-10-CM | POA: Insufficient documentation

## 2020-04-30 DIAGNOSIS — M1611 Unilateral primary osteoarthritis, right hip: Secondary | ICD-10-CM | POA: Insufficient documentation

## 2020-04-30 DIAGNOSIS — Z9889 Other specified postprocedural states: Secondary | ICD-10-CM | POA: Diagnosis present

## 2020-04-30 DIAGNOSIS — Z8673 Personal history of transient ischemic attack (TIA), and cerebral infarction without residual deficits: Secondary | ICD-10-CM | POA: Insufficient documentation

## 2020-04-30 DIAGNOSIS — R4701 Aphasia: Secondary | ICD-10-CM | POA: Diagnosis present

## 2020-04-30 DIAGNOSIS — Z96641 Presence of right artificial hip joint: Secondary | ICD-10-CM | POA: Diagnosis not present

## 2020-04-30 DIAGNOSIS — Z471 Aftercare following joint replacement surgery: Secondary | ICD-10-CM | POA: Diagnosis not present

## 2020-04-30 DIAGNOSIS — R7303 Prediabetes: Secondary | ICD-10-CM | POA: Diagnosis not present

## 2020-04-30 DIAGNOSIS — I10 Essential (primary) hypertension: Secondary | ICD-10-CM | POA: Diagnosis present

## 2020-04-30 DIAGNOSIS — Z79890 Hormone replacement therapy (postmenopausal): Secondary | ICD-10-CM | POA: Diagnosis not present

## 2020-04-30 DIAGNOSIS — Z419 Encounter for procedure for purposes other than remedying health state, unspecified: Secondary | ICD-10-CM

## 2020-04-30 DIAGNOSIS — E785 Hyperlipidemia, unspecified: Secondary | ICD-10-CM | POA: Diagnosis not present

## 2020-04-30 DIAGNOSIS — F54 Psychological and behavioral factors associated with disorders or diseases classified elsewhere: Secondary | ICD-10-CM | POA: Diagnosis present

## 2020-04-30 HISTORY — PX: TOTAL HIP ARTHROPLASTY: SHX124

## 2020-04-30 SURGERY — ARTHROPLASTY, HIP, TOTAL, ANTERIOR APPROACH
Anesthesia: Spinal | Site: Hip | Laterality: Right

## 2020-04-30 MED ORDER — ONDANSETRON HCL 4 MG PO TABS: 4.0000 mg | ORAL_TABLET | Freq: Every day | ORAL | 1 refills | Status: DC | PRN

## 2020-04-30 MED ORDER — DEXAMETHASONE SODIUM PHOSPHATE 10 MG/ML IJ SOLN
8.0000 mg | Freq: Once | INTRAMUSCULAR | Status: AC
  Administered 2020-04-30: 8 mg via INTRAVENOUS

## 2020-04-30 MED ORDER — SODIUM CHLORIDE (PF) 0.9 % IJ SOLN
INTRAMUSCULAR | Status: DC | PRN
  Administered 2020-04-30: 20 mL

## 2020-04-30 MED ORDER — WATER FOR IRRIGATION, STERILE IR SOLN
Status: DC | PRN
  Administered 2020-04-30: 2000 mL

## 2020-04-30 MED ORDER — ONDANSETRON HCL 4 MG/2ML IJ SOLN
INTRAMUSCULAR | Status: AC
  Filled 2020-04-30: qty 2

## 2020-04-30 MED ORDER — MIDAZOLAM HCL 5 MG/5ML IJ SOLN
INTRAMUSCULAR | Status: DC | PRN
  Administered 2020-04-30: 2 mg via INTRAVENOUS

## 2020-04-30 MED ORDER — ACETAMINOPHEN 500 MG PO TABS
1000.0000 mg | ORAL_TABLET | Freq: Once | ORAL | Status: AC
  Administered 2020-04-30: 1000 mg via ORAL
  Filled 2020-04-30: qty 2

## 2020-04-30 MED ORDER — ASPIRIN EC 81 MG PO TBEC
81.0000 mg | DELAYED_RELEASE_TABLET | Freq: Two times a day (BID) | ORAL | 11 refills | Status: AC
Start: 2020-04-30 — End: 2021-04-30

## 2020-04-30 MED ORDER — MIDAZOLAM HCL 2 MG/2ML IJ SOLN: 0.5000 mg | Freq: Once | INTRAMUSCULAR | Status: DC | PRN

## 2020-04-30 MED ORDER — LACTATED RINGERS IV SOLN: INTRAVENOUS | Status: DC

## 2020-04-30 MED ORDER — PHENYLEPHRINE HCL (PRESSORS) 10 MG/ML IV SOLN
INTRAVENOUS | Status: AC
  Filled 2020-04-30: qty 1

## 2020-04-30 MED ORDER — CEFAZOLIN SODIUM-DEXTROSE 2-4 GM/100ML-% IV SOLN
2.0000 g | INTRAVENOUS | Status: AC
  Administered 2020-04-30: 2 g via INTRAVENOUS
  Filled 2020-04-30: qty 100

## 2020-04-30 MED ORDER — HYDROMORPHONE HCL 1 MG/ML IJ SOLN
0.2500 mg | INTRAMUSCULAR | Status: DC | PRN
  Administered 2020-04-30 (×4): 0.5 mg via INTRAVENOUS

## 2020-04-30 MED ORDER — BUPIVACAINE LIPOSOME 1.3 % IJ SUSP
INTRAMUSCULAR | Status: DC | PRN
  Administered 2020-04-30: 10 mL

## 2020-04-30 MED ORDER — FENTANYL CITRATE (PF) 100 MCG/2ML IJ SOLN
INTRAMUSCULAR | Status: AC
  Filled 2020-04-30: qty 2

## 2020-04-30 MED ORDER — EPHEDRINE SULFATE-NACL 50-0.9 MG/10ML-% IV SOSY
PREFILLED_SYRINGE | INTRAVENOUS | Status: DC | PRN
  Administered 2020-04-30 (×2): 5 mg via INTRAVENOUS

## 2020-04-30 MED ORDER — PROPOFOL 10 MG/ML IV BOLUS
INTRAVENOUS | Status: AC
  Filled 2020-04-30: qty 20

## 2020-04-30 MED ORDER — CELECOXIB 200 MG PO CAPS
200.0000 mg | ORAL_CAPSULE | Freq: Two times a day (BID) | ORAL | 0 refills | Status: DC
Start: 2020-04-30 — End: 2021-02-20

## 2020-04-30 MED ORDER — METHOCARBAMOL 500 MG PO TABS: 500.0000 mg | ORAL_TABLET | Freq: Three times a day (TID) | ORAL | 1 refills | Status: AC

## 2020-04-30 MED ORDER — PROPOFOL 500 MG/50ML IV EMUL
INTRAVENOUS | Status: AC
  Filled 2020-04-30: qty 50

## 2020-04-30 MED ORDER — SODIUM CHLORIDE (PF) 0.9 % IJ SOLN
INTRAMUSCULAR | Status: AC
  Filled 2020-04-30: qty 50

## 2020-04-30 MED ORDER — BUPIVACAINE IN DEXTROSE 0.75-8.25 % IT SOLN
INTRATHECAL | Status: DC | PRN
Start: 2020-04-30 — End: 2020-04-30
  Administered 2020-04-30: 1.6 mL via INTRATHECAL

## 2020-04-30 MED ORDER — HYDROMORPHONE HCL 1 MG/ML IJ SOLN
INTRAMUSCULAR | Status: AC
  Filled 2020-04-30: qty 1

## 2020-04-30 MED ORDER — POVIDONE-IODINE 10 % EX SWAB
2.0000 "application " | Freq: Once | CUTANEOUS | Status: AC
  Administered 2020-04-30: 2 via TOPICAL

## 2020-04-30 MED ORDER — MEPERIDINE HCL 50 MG/ML IJ SOLN: 6.2500 mg | INTRAMUSCULAR | Status: DC | PRN

## 2020-04-30 MED ORDER — CHLORHEXIDINE GLUCONATE 0.12 % MT SOLN
15.0000 mL | Freq: Once | OROMUCOSAL | Status: AC
  Administered 2020-04-30: 15 mL via OROMUCOSAL

## 2020-04-30 MED ORDER — LACTATED RINGERS IV BOLUS
500.0000 mL | Freq: Once | INTRAVENOUS | Status: AC
  Administered 2020-04-30: 500 mL via INTRAVENOUS

## 2020-04-30 MED ORDER — MIDAZOLAM HCL 2 MG/2ML IJ SOLN
INTRAMUSCULAR | Status: AC
  Filled 2020-04-30: qty 2

## 2020-04-30 MED ORDER — PROMETHAZINE HCL 25 MG/ML IJ SOLN: 6.2500 mg | INTRAMUSCULAR | Status: DC | PRN

## 2020-04-30 MED ORDER — PROPOFOL 10 MG/ML IV BOLUS
INTRAVENOUS | Status: DC | PRN
  Administered 2020-04-30 (×2): 10 mg via INTRAVENOUS

## 2020-04-30 MED ORDER — PROPOFOL 500 MG/50ML IV EMUL
INTRAVENOUS | Status: DC | PRN
  Administered 2020-04-30: 50 ug/kg/min via INTRAVENOUS

## 2020-04-30 MED ORDER — DEXAMETHASONE SODIUM PHOSPHATE 10 MG/ML IJ SOLN
INTRAMUSCULAR | Status: AC
  Filled 2020-04-30: qty 1

## 2020-04-30 MED ORDER — 0.9 % SODIUM CHLORIDE (POUR BTL) OPTIME
TOPICAL | Status: DC | PRN
  Administered 2020-04-30: 1000 mL

## 2020-04-30 MED ORDER — POVIDONE-IODINE 10 % EX SWAB: 2.0000 "application " | Freq: Once | CUTANEOUS | Status: DC

## 2020-04-30 MED ORDER — GLYCOPYRROLATE PF 0.2 MG/ML IJ SOSY
PREFILLED_SYRINGE | INTRAMUSCULAR | Status: DC | PRN
  Administered 2020-04-30: .2 mg via INTRAVENOUS

## 2020-04-30 MED ORDER — OXYCODONE HCL 5 MG/5ML PO SOLN: 5.0000 mg | Freq: Once | ORAL | Status: AC | PRN

## 2020-04-30 MED ORDER — FENTANYL CITRATE (PF) 100 MCG/2ML IJ SOLN
INTRAMUSCULAR | Status: DC | PRN
  Administered 2020-04-30 (×2): 50 ug via INTRAVENOUS

## 2020-04-30 MED ORDER — TRANEXAMIC ACID-NACL 1000-0.7 MG/100ML-% IV SOLN
1000.0000 mg | INTRAVENOUS | Status: AC
  Administered 2020-04-30: 1000 mg via INTRAVENOUS
  Filled 2020-04-30: qty 100

## 2020-04-30 MED ORDER — PHENYLEPHRINE HCL-NACL 10-0.9 MG/250ML-% IV SOLN
INTRAVENOUS | Status: DC | PRN
  Administered 2020-04-30: 40 ug/min via INTRAVENOUS

## 2020-04-30 MED ORDER — LACTATED RINGERS IV BOLUS
250.0000 mL | Freq: Once | INTRAVENOUS | Status: AC
  Administered 2020-04-30: 250 mL via INTRAVENOUS

## 2020-04-30 MED ORDER — OXYCODONE HCL 5 MG PO TABS
ORAL_TABLET | ORAL | Status: AC
  Filled 2020-04-30: qty 1

## 2020-04-30 MED ORDER — HYDROCODONE-ACETAMINOPHEN 5-325 MG PO TABS: 1.0000 | ORAL_TABLET | ORAL | 0 refills | Status: AC | PRN

## 2020-04-30 MED ORDER — ONDANSETRON HCL 4 MG/2ML IJ SOLN
INTRAMUSCULAR | Status: DC | PRN
  Administered 2020-04-30: 4 mg via INTRAVENOUS

## 2020-04-30 MED ORDER — OXYCODONE HCL 5 MG PO TABS
5.0000 mg | ORAL_TABLET | Freq: Once | ORAL | Status: AC | PRN
  Administered 2020-04-30: 5 mg via ORAL

## 2020-04-30 MED ORDER — ORAL CARE MOUTH RINSE: 15.0000 mL | Freq: Once | OROMUCOSAL | Status: AC

## 2020-04-30 MED ORDER — POVIDONE-IODINE 7.5 % EX SOLN: Freq: Once | CUTANEOUS | Status: DC

## 2020-04-30 MED ORDER — HYDROCODONE-ACETAMINOPHEN 5-325 MG PO TABS: 1.0000 | ORAL_TABLET | ORAL | 0 refills | Status: DC | PRN

## 2020-04-30 SURGICAL SUPPLY — 45 items
APL PRP STRL LF DISP 70% ISPRP (MISCELLANEOUS) ×1
BAG SPEC THK2 15X12 ZIP CLS (MISCELLANEOUS)
BAG ZIPLOCK 12X15 (MISCELLANEOUS) IMPLANT
BLADE SAG 18X100X1.27 (BLADE) ×2 IMPLANT
BLADE SURG SZ10 CARB STEEL (BLADE) ×4 IMPLANT
CHLORAPREP W/TINT 26 (MISCELLANEOUS) ×2 IMPLANT
CLSR STERI-STRIP ANTIMIC 1/2X4 (GAUZE/BANDAGES/DRESSINGS) ×2 IMPLANT
COVER PERINEAL POST (MISCELLANEOUS) ×2 IMPLANT
COVER SURGICAL LIGHT HANDLE (MISCELLANEOUS) ×2 IMPLANT
COVER WAND RF STERILE (DRAPES) IMPLANT
DECANTER SPIKE VIAL GLASS SM (MISCELLANEOUS) ×4 IMPLANT
DRAPE IMP U-DRAPE 54X76 (DRAPES) ×2 IMPLANT
DRAPE STERI IOBAN 125X83 (DRAPES) ×2 IMPLANT
DRAPE U-SHAPE 47X51 STRL (DRAPES) ×4 IMPLANT
DRSG MEPILEX BORDER 4X8 (GAUZE/BANDAGES/DRESSINGS) ×2 IMPLANT
ELECT BLADE TIP CTD 4 INCH (ELECTRODE) ×2 IMPLANT
ELECT REM PT RETURN 15FT ADLT (MISCELLANEOUS) ×2 IMPLANT
GLOVE BIO SURGEON STRL SZ7.5 (GLOVE) ×4 IMPLANT
GLOVE BIOGEL PI IND STRL 8 (GLOVE) ×2 IMPLANT
GLOVE BIOGEL PI INDICATOR 8 (GLOVE) ×2
GOWN STRL REUS W/TWL LRG LVL3 (GOWN DISPOSABLE) ×2 IMPLANT
GOWN STRL REUS W/TWL XL LVL3 (GOWN DISPOSABLE) ×2 IMPLANT
HEAD BIOLOX HIP 36/-5 (Joint) ×1 IMPLANT
HIP BIOLOX HD 36/-5 (Joint) ×2 IMPLANT
HOLDER FOLEY CATH W/STRAP (MISCELLANEOUS) IMPLANT
INSERT 0 DEGREE 36 (Miscellaneous) ×2 IMPLANT
KIT TURNOVER KIT A (KITS) IMPLANT
MANIFOLD NEPTUNE II (INSTRUMENTS) ×2 IMPLANT
NS IRRIG 1000ML POUR BTL (IV SOLUTION) ×2 IMPLANT
PACK ANTERIOR HIP CUSTOM (KITS) ×2 IMPLANT
PROTECTOR NERVE ULNAR (MISCELLANEOUS) ×2 IMPLANT
SCREW HEX LP 6.5X20 (Screw) ×2 IMPLANT
SHELL ACETAB TRIDENT 48 (Shell) ×2 IMPLANT
STEM HIP 4 127DEG (Stem) ×2 IMPLANT
STRIP CLOSURE SKIN 1/2X4 (GAUZE/BANDAGES/DRESSINGS) ×2 IMPLANT
SUT MNCRL AB 4-0 PS2 18 (SUTURE) ×2 IMPLANT
SUT STRATAFIX 0 PDS 27 VIOLET (SUTURE) ×2
SUT VIC AB 0 CT1 36 (SUTURE) ×2 IMPLANT
SUT VIC AB 1 CT1 36 (SUTURE) ×2 IMPLANT
SUT VIC AB 2-0 CT1 27 (SUTURE) ×4
SUT VIC AB 2-0 CT1 TAPERPNT 27 (SUTURE) ×2 IMPLANT
SUTURE STRATFX 0 PDS 27 VIOLET (SUTURE) ×1 IMPLANT
TRAY FOLEY MTR SLVR 16FR STAT (SET/KITS/TRAYS/PACK) IMPLANT
WATER STERILE IRR 1000ML POUR (IV SOLUTION) ×4 IMPLANT
YANKAUER SUCT BULB TIP 10FT TU (MISCELLANEOUS) ×2 IMPLANT

## 2020-04-30 NOTE — Discharge Instructions (Signed)

## 2020-04-30 NOTE — Interval H&P Note (Signed)
History and Physical Interval Note:  04/30/2020 6:34 AM  Sarah [name]  has presented today for surgery, with the diagnosis of OA RIGHT HIP.  The various methods of treatment have been discussed with the patient and family. After consideration of risks, benefits and other options for treatment, the patient has consented to  Procedure(s): TOTAL HIP ARTHROPLASTY ANTERIOR APPROACH (Right) as a surgical intervention.  The patient's history has been reviewed, patient examined, no change in status, stable for surgery.  I have reviewed the patient's chart and labs.  Questions were answered to the patient's satisfaction.     Renette Butters

## 2020-04-30 NOTE — Evaluation (Addendum)
Physical Therapy Evaluation Patient Details Name: Sarah [name] MRN: [id number] DOB: [birth year] Today's Date: 04/30/2020   History of Present Illness  Patient is 69 y.o. female s/p Rt THA anterior approach on 04/30/20 with PMH significant for OA, Fibromyalgia, back pain, stroke.    Clinical Impression  Sarah [name] is a 69 y.o. female POD 0 s/p Rt THA. Patient reports independence with mobility at baseline. Patient is now limited by functional impairments (see PT problem list below) and requires min guard/supervision for transfers and gait with RW. Patient was able to ambulate 130 feet with RW and min guard/supervision and cues for safe walker management. Patient educated on safe sequencing for stair mobility and verbalized safe guarding position for people assisting with mobility. Patient instructed in exercises to facilitate ROM and circulation. Patient will benefit from continued skilled PT interventions to address impairments and progress towards PLOF. Patient has met mobility goals at adequate level for discharge home; will continue to follow if pt continues acute stay to progress towards Mod I goals.     Follow Up Recommendations Follow surgeon's recommendation for DC plan and follow-up therapies;Outpatient PT    Equipment Recommendations  Rolling walker with 5" wheels (delivered in pacu)    Recommendations for Other Services       Precautions / Restrictions Precautions Precautions: Fall Restrictions Weight Bearing Restrictions: No      Mobility  Bed Mobility Overal bed mobility: Needs Assistance Bed Mobility: Supine to Sit     Supine to sit: Modified independent (Device/Increase time);Supervision        Transfers Overall transfer level: Needs assistance Equipment used: Rolling walker (2 wheeled) Transfers: Sit to/from Stand Sit to Stand: Supervision            Ambulation/Gait Ambulation/Gait assistance: Supervision;Min guard Gait Distance (Feet): 130  Feet Assistive device: Rolling walker (2 wheeled) Gait Pattern/deviations: Step-to pattern;Decreased stride length Gait velocity: decr   General Gait Details: VC's for safe step pattern and proximity to RW, no overt LOB noted.   Stairs Stairs: Yes Stairs assistance: Min guard Stair Management: No rails;With walker;Step to pattern;Backwards Number of Stairs: 2 General stair comments: 2x1 step for reverse step up, cues for safe step sequencing and use of walker.   Wheelchair Mobility    Modified Rankin (Stroke Patients Only)       Balance Overall balance assessment: Needs assistance Sitting-balance support: Feet supported Sitting balance-Leahy Scale: Good     Standing balance support: During functional activity;Bilateral upper extremity supported Standing balance-Leahy Scale: Fair            Pertinent Vitals/Pain Pain Assessment: 0-10 Pain Score: 7  Pain Location: Rt hip Pain Descriptors / Indicators: Discomfort;Sore Pain Intervention(s): Limited activity within patient's tolerance;Monitored during session;Repositioned    Home Living Family/patient expects to be discharged to:: Private residence Living Arrangements: Non-relatives/Friends;Alone Available Help at Discharge: Friend(s);Neighbor Type of Home: Apartment Home Access: Level entry     Home Layout: One level Home Equipment: [place] - single point Additional Comments: pt's son will pick her up and her friend will stay with her tonight and as needed.     Prior Function Level of Independence: Independent with assistive device(s)         Comments: pt hass been using SPC and rollator from friend due to hip pain.     Hand Dominance   Dominant Hand: Right    Extremity/Trunk Assessment   Upper Extremity Assessment Upper Extremity Assessment: Overall WFL for tasks assessed    Lower  Extremity Assessment Lower Extremity Assessment: Overall WFL for tasks assessed    Cervical / Trunk  Assessment Cervical / Trunk Assessment: Normal  Communication   Communication: No difficulties  Cognition Arousal/Alertness: Awake/alert Behavior During Therapy: WFL for tasks assessed/performed Overall Cognitive Status: Within Functional Limits for tasks assessed               General Comments      Exercises Total Joint Exercises Ankle Circles/Pumps: Both;10 reps;Seated;AROM Quad Sets: AROM;Right;Other reps (comment);Seated (3) Short Arc Quad: AROM;Right;Other reps (comment);Seated (3) Heel Slides: AROM;Right;Other reps (comment);Seated (3) Hip ABduction/ADduction: AROM;Right;Other reps (comment);Seated (3) Long Arc Quad: AROM;Right;Other reps (comment);Seated (3)   Assessment/Plan    PT Assessment Patient needs continued PT services  PT Problem List Decreased strength;Decreased range of motion;Decreased activity tolerance;Decreased balance;Decreased mobility;Decreased knowledge of use of DME       PT Treatment Interventions Gait training;DME instruction;Stair training;Functional mobility training;Therapeutic activities;Therapeutic exercise;Balance training;Patient/family education    PT Goals (Current goals can be found in the Care Plan section)  Acute Rehab PT Goals Patient Stated Goal: go home PT Goal Formulation: With patient Time For Goal Achievement: 05/03/20 Potential to Achieve Goals: Good    Frequency 7X/week    AM-PAC PT "6 Clicks" Mobility  Outcome Measure Help needed turning from your back to your side while in a flat bed without using bedrails?: None Help needed moving from lying on your back to sitting on the side of a flat bed without using bedrails?: None Help needed moving to and from a bed to a chair (including a wheelchair)?: A Little Help needed standing up from a chair using your arms (e.g., wheelchair or bedside chair)?: A Little Help needed to walk in hospital room?: A Little Help needed climbing 3-5 steps with a railing? : A Little 6 Click  Score: 20    End of Session Equipment Utilized During Treatment: Gait belt Activity Tolerance: Patient tolerated treatment well Patient left: in chair;with call bell/phone within reach;with chair alarm set Nurse Communication: Mobility status PT Visit Diagnosis: Muscle weakness (generalized) (M62.81);Difficulty in walking, not elsewhere classified (R26.2)    Time: 6256-3893 PT Time Calculation (min) (ACUTE ONLY): 38 min   Charges:   PT Evaluation $PT Eval Low Complexity: 1 Low PT Treatments $Gait Training: 8-22 mins $Therapeutic Exercise: 8-22 mins        Verner Mould, DPT Acute Rehabilitation Services  Office [phone] Pager [phone]  04/30/2020 2:17 PM

## 2020-04-30 NOTE — Op Note (Signed)
04/30/2020  9:01 AM  PATIENT:  Sarah [name]   MRN: [id number]  PRE-OPERATIVE DIAGNOSIS:  OA RIGHT HIP  POST-OPERATIVE DIAGNOSIS:  OA RIGHT HIP  PROCEDURE:  Procedure(s): TOTAL HIP ARTHROPLASTY ANTERIOR APPROACH  PREOPERATIVE INDICATIONS:    Sarah [name] is an 69 y.o. female who has a diagnosis of Primary localized osteoarthritis of right hip and elected for surgical management after failing conservative treatment.  The risks benefits and alternatives were discussed with the patient including but not limited to the risks of nonoperative treatment, versus surgical intervention including infection, bleeding, nerve injury, periprosthetic fracture, the need for revision surgery, dislocation, leg length discrepancy, blood clots, cardiopulmonary complications, morbidity, mortality, among others, and they were willing to proceed.     OPERATIVE REPORT     SURGEON:   Renette Butters, MD    ASSISTANT:  Margy Clarks, PA-C, he was present and scrubbed throughout the case, critical for completion in a timely fashion, and for retraction, instrumentation, and closure.     ANESTHESIA:  General    COMPLICATIONS:  None.     COMPONENTS:  [employer]ker acolade fit femur size 4 with a 36 mm -5 head ball and an acetabular shell size 48 with a  polyethylene liner    PROCEDURE IN DETAIL:   The patient was met in the holding area and  identified.  The appropriate hip was identified and marked at the operative site.  The patient was then transported to the OR  and  placed under anesthesia per that record.  At that point, the patient was  placed in the supine position and  secured to the operating room table and all bony prominences padded. He received pre-operative antibiotics    The operative lower extremity was prepped from the iliac crest to the distal leg.  Sterile draping was performed.  Time out was performed prior to incision.      Skin incision was made just 2 cm lateral to the ASIS  extending  in line with the tensor fascia lata. Electrocautery was used to control all bleeders. I dissected down sharply to the fascia of the tensor fascia lata was confirmed that the muscle fibers beneath were running posteriorly. I then incised the fascia over the superficial tensor fascia lata in line with the incision. The fascia was elevated off the anterior aspect of the muscle the muscle was retracted posteriorly and protected throughout the case. I then used electrocautery to incise the tensor fascia lata fascia control and all bleeders. Immediately visible was the fat over top of the anterior neck and capsule.  I removed the anterior fat from the capsule and elevated the rectus muscle off of the anterior capsule. I then removed a large time of capsule. The retractors were then placed over the anterior acetabulum as well as around the superior and inferior neck.  I then made a femoral neck cut. Then used the power corkscrew to remove the femoral head from the acetabulum and thoroughly irrigated the acetabulum. I sized the femoral head.    I then exposed the deep acetabulum, cleared out any tissue including the ligamentum teres.   After adequate visualization, I excised the labrum, and then sequentially reamed.  I then impacted the acetabular implant into place using fluoroscopy for guidance.  Appropriate version and inclination was confirmed clinically matching their bony anatomy, and with fluoroscopy.  I placed a 20 mm screw in the posterior/superio position with an excellent bite.    I then placed the polyethylene  liner in place  I then adducted the leg and released the external rotators from the posterior femur allowing it to be easily delivered up lateral and anterior to the acetabulum for preparation of the femoral canal.    I then prepared the proximal femur using the cookie-cutter and then sequentially reamed and broached.  A trial broach, neck, and head was utilized, and I reduced the hip and  used floroscopy to assess the neck length and femoral implant.  I then impacted the femoral prosthesis into place into the appropriate version. The hip was then reduced and fluoroscopy confirmed appropriate position. Leg lengths were restored.  I then irrigated the hip copiously again with, and repaired the fascia with Vicryl, followed by monocryl for the subcutaneous tissue, Monocryl for the skin, Steri-Strips and sterile gauze. The patient was then awakened and returned to PACU in stable and satisfactory condition. There were no complications.  POST OPERATIVE PLAN: WBAT, DVT px: SCD's/TED, ambulation and chemical dvt px  Edmonia Lynch, MD Orthopedic Surgeon [phone]

## 2020-04-30 NOTE — Transfer of Care (Signed)
Immediate Anesthesia Transfer of Care Note  Patient: Sarah [name]  Procedure(s) Performed: TOTAL HIP ARTHROPLASTY ANTERIOR APPROACH (Right Hip)  Patient Location: PACU  Anesthesia Type:Spinal  Level of Consciousness: drowsy and patient cooperative  Airway & Oxygen Therapy: Patient Spontanous Breathing and Patient connected to face mask oxygen  Post-op Assessment: Report given to RN and Post -op Vital signs reviewed and stable  Post vital signs: Reviewed and stable  Last Vitals:  Vitals Value Taken Time  BP    Temp    Pulse    Resp    SpO2      Last Pain:  Vitals:   04/30/20 0615  TempSrc: Oral  PainSc: 8       Patients Stated Pain Goal: 7 (40/97/35 3299)  Complications: No complications documented.

## 2020-04-30 NOTE — Anesthesia Postprocedure Evaluation (Signed)
Anesthesia Post Note  Patient: Sarah [name]  Procedure(s) Performed: TOTAL HIP ARTHROPLASTY ANTERIOR APPROACH (Right Hip)     Patient location during evaluation: Phase II Anesthesia Type: Spinal Level of consciousness: awake and alert, patient cooperative and oriented Pain management: pain level controlled Vital Signs Assessment: post-procedure vital signs reviewed and stable Respiratory status: spontaneous breathing, nonlabored ventilation and respiratory function stable Cardiovascular status: stable and blood pressure returned to baseline Postop Assessment: no apparent nausea or vomiting, able to ambulate, adequate PO intake and spinal receding Anesthetic complications: no   No complications documented.  Last Vitals:  Vitals:   04/30/20 1112 04/30/20 1200  BP: 113/70 127/78  Pulse: 65 (!) 106  Resp: 18 16  Temp: (!) 36.3 C   SpO2: 95% 91%    Last Pain:  Vitals:   04/30/20 1200  TempSrc:   PainSc: 5                  ,E. 

## 2020-05-01 ENCOUNTER — Encounter (HOSPITAL_COMMUNITY): Payer: Self-pay | Admitting: Orthopedic Surgery

## 2020-05-02 ENCOUNTER — Other Ambulatory Visit: Payer: Self-pay | Admitting: Obstetrics and Gynecology

## 2020-05-02 ENCOUNTER — Ambulatory Visit: Payer: Medicare HMO | Attending: Orthopedic Surgery | Admitting: Physical Therapy

## 2020-05-02 ENCOUNTER — Encounter: Payer: Self-pay | Admitting: Physical Therapy

## 2020-05-02 ENCOUNTER — Other Ambulatory Visit: Payer: Self-pay

## 2020-05-02 DIAGNOSIS — M25651 Stiffness of right hip, not elsewhere classified: Secondary | ICD-10-CM

## 2020-05-02 DIAGNOSIS — R262 Difficulty in walking, not elsewhere classified: Secondary | ICD-10-CM | POA: Diagnosis not present

## 2020-05-02 DIAGNOSIS — M6281 Muscle weakness (generalized): Secondary | ICD-10-CM | POA: Insufficient documentation

## 2020-05-02 DIAGNOSIS — M25551 Pain in right hip: Secondary | ICD-10-CM
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
## 2020-05-03 ENCOUNTER — Telehealth: Payer: Self-pay | Admitting: Obstetrics and Gynecology

## 2020-05-03 DIAGNOSIS — R69 Illness, unspecified: Secondary | ICD-10-CM | POA: Diagnosis not present

## 2020-05-03 DIAGNOSIS — I10 Essential (primary) hypertension: Secondary | ICD-10-CM | POA: Diagnosis not present

## 2020-05-03 DIAGNOSIS — G894 Chronic pain syndrome: Secondary | ICD-10-CM | POA: Diagnosis not present

## 2020-05-03 DIAGNOSIS — E7849 Other hyperlipidemia: Secondary | ICD-10-CM | POA: Diagnosis not present

## 2020-05-03 MED ORDER — ESTRADIOL 1 MG PO TABS: 1.0000 mg | ORAL_TABLET | Freq: Every day | ORAL | 0 refills | Status: DC

## 2020-05-03 NOTE — Telephone Encounter (Signed)
Will refill estrace for Dr Glo Herring

## 2020-05-03 NOTE — Telephone Encounter (Signed)
Needs refill on Estradiol please.

## 2020-05-03 NOTE — Telephone Encounter (Signed)
Patient called in about needing a refill , and would like for someone to give her a call.

## 2020-05-06 ENCOUNTER — Ambulatory Visit: Payer: Medicare HMO | Admitting: Physical Therapy

## 2020-05-08 ENCOUNTER — Ambulatory Visit: Payer: Medicare HMO | Attending: Orthopedic Surgery | Admitting: Physical Therapy

## 2020-05-08 ENCOUNTER — Other Ambulatory Visit: Payer: Self-pay

## 2020-05-08 ENCOUNTER — Encounter: Payer: Self-pay | Admitting: Physical Therapy

## 2020-05-08 DIAGNOSIS — M6281 Muscle weakness (generalized): Secondary | ICD-10-CM | POA: Insufficient documentation

## 2020-05-08 DIAGNOSIS — M25551 Pain in right hip: Secondary | ICD-10-CM | POA: Diagnosis not present

## 2020-05-08 DIAGNOSIS — M25651 Stiffness of right hip, not elsewhere classified: Secondary | ICD-10-CM | POA: Insufficient documentation

## 2020-05-08 DIAGNOSIS — R262 Difficulty in walking, not elsewhere classified: Secondary | ICD-10-CM | POA: Insufficient documentation
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
## 2020-05-10 ENCOUNTER — Ambulatory Visit: Payer: Medicare HMO | Admitting: Physical Therapy

## 2020-05-10 ENCOUNTER — Encounter: Payer: Self-pay | Admitting: Physical Therapy

## 2020-05-10 ENCOUNTER — Other Ambulatory Visit: Payer: Self-pay | Admitting: Adult Health

## 2020-05-10 ENCOUNTER — Other Ambulatory Visit: Payer: Self-pay

## 2020-05-10 DIAGNOSIS — R262 Difficulty in walking, not elsewhere classified: Secondary | ICD-10-CM | POA: Diagnosis not present

## 2020-05-10 DIAGNOSIS — M25551 Pain in right hip: Secondary | ICD-10-CM | POA: Diagnosis not present

## 2020-05-10 DIAGNOSIS — M25651 Stiffness of right hip, not elsewhere classified: Secondary | ICD-10-CM | POA: Diagnosis not present

## 2020-05-10 DIAGNOSIS — M6281 Muscle weakness (generalized): Secondary | ICD-10-CM | POA: Diagnosis not present
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
## 2020-05-13 ENCOUNTER — Other Ambulatory Visit: Payer: Self-pay

## 2020-05-13 ENCOUNTER — Ambulatory Visit: Payer: Medicare HMO | Admitting: Physical Therapy

## 2020-05-13 DIAGNOSIS — M6281 Muscle weakness (generalized): Secondary | ICD-10-CM | POA: Diagnosis not present

## 2020-05-13 DIAGNOSIS — R262 Difficulty in walking, not elsewhere classified: Secondary | ICD-10-CM

## 2020-05-13 DIAGNOSIS — M25551 Pain in right hip: Secondary | ICD-10-CM | POA: Diagnosis not present

## 2020-05-13 DIAGNOSIS — M25651 Stiffness of right hip, not elsewhere classified: Secondary | ICD-10-CM
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
## 2020-05-15 ENCOUNTER — Ambulatory Visit: Payer: Medicare HMO | Admitting: Physical Therapy

## 2020-05-15 ENCOUNTER — Other Ambulatory Visit: Payer: Self-pay

## 2020-05-15 DIAGNOSIS — M25551 Pain in right hip: Secondary | ICD-10-CM | POA: Diagnosis not present

## 2020-05-15 DIAGNOSIS — R262 Difficulty in walking, not elsewhere classified: Secondary | ICD-10-CM

## 2020-05-15 DIAGNOSIS — M6281 Muscle weakness (generalized): Secondary | ICD-10-CM

## 2020-05-15 DIAGNOSIS — M25651 Stiffness of right hip, not elsewhere classified: Secondary | ICD-10-CM

## 2020-05-15 DIAGNOSIS — M1611 Unilateral primary osteoarthritis, right hip: Secondary | ICD-10-CM | POA: Diagnosis not present
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
## 2020-05-17 ENCOUNTER — Ambulatory Visit: Payer: Medicare HMO | Admitting: Physical Therapy

## 2020-05-17 ENCOUNTER — Encounter: Payer: Self-pay | Admitting: Physical Therapy

## 2020-05-17 ENCOUNTER — Other Ambulatory Visit: Payer: Self-pay

## 2020-05-17 DIAGNOSIS — M6281 Muscle weakness (generalized): Secondary | ICD-10-CM

## 2020-05-17 DIAGNOSIS — R262 Difficulty in walking, not elsewhere classified: Secondary | ICD-10-CM | POA: Diagnosis not present

## 2020-05-17 DIAGNOSIS — M25551 Pain in right hip: Secondary | ICD-10-CM

## 2020-05-17 DIAGNOSIS — M25651 Stiffness of right hip, not elsewhere classified: Secondary | ICD-10-CM | POA: Diagnosis not present
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
## 2020-05-20 ENCOUNTER — Other Ambulatory Visit: Payer: Self-pay

## 2020-05-20 ENCOUNTER — Ambulatory Visit: Payer: Medicare HMO | Admitting: Physical Therapy

## 2020-05-20 ENCOUNTER — Encounter: Payer: Self-pay | Admitting: Physical Therapy

## 2020-05-20 DIAGNOSIS — M25551 Pain in right hip: Secondary | ICD-10-CM | POA: Diagnosis not present

## 2020-05-20 DIAGNOSIS — M6281 Muscle weakness (generalized): Secondary | ICD-10-CM | POA: Diagnosis not present

## 2020-05-20 DIAGNOSIS — M25651 Stiffness of right hip, not elsewhere classified: Secondary | ICD-10-CM | POA: Diagnosis not present

## 2020-05-20 DIAGNOSIS — R262 Difficulty in walking, not elsewhere classified: Secondary | ICD-10-CM
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
## 2020-05-21 DIAGNOSIS — M25551 Pain in right hip: Secondary | ICD-10-CM | POA: Diagnosis not present

## 2020-05-21 DIAGNOSIS — M1611 Unilateral primary osteoarthritis, right hip: Secondary | ICD-10-CM | POA: Diagnosis not present

## 2020-05-21 DIAGNOSIS — L03115 Cellulitis of right lower limb: Secondary | ICD-10-CM | POA: Diagnosis not present

## 2020-05-22 ENCOUNTER — Encounter: Payer: Medicare HMO | Admitting: Physical Therapy

## 2020-05-24 ENCOUNTER — Encounter: Payer: Medicare HMO | Admitting: Physical Therapy

## 2020-05-29 ENCOUNTER — Other Ambulatory Visit: Payer: Self-pay

## 2020-05-29 ENCOUNTER — Encounter: Payer: Self-pay | Admitting: Physical Therapy

## 2020-05-29 ENCOUNTER — Ambulatory Visit: Payer: Medicare HMO | Admitting: Physical Therapy

## 2020-05-29 DIAGNOSIS — M25651 Stiffness of right hip, not elsewhere classified: Secondary | ICD-10-CM | POA: Diagnosis not present

## 2020-05-29 DIAGNOSIS — R262 Difficulty in walking, not elsewhere classified: Secondary | ICD-10-CM

## 2020-05-29 DIAGNOSIS — M6281 Muscle weakness (generalized): Secondary | ICD-10-CM | POA: Diagnosis not present

## 2020-05-29 DIAGNOSIS — M25551 Pain in right hip: Secondary | ICD-10-CM | POA: Diagnosis not present
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
## 2020-05-31 ENCOUNTER — Encounter: Payer: Self-pay | Admitting: Physical Therapy

## 2020-05-31 ENCOUNTER — Ambulatory Visit: Payer: Medicare HMO | Admitting: Physical Therapy

## 2020-05-31 ENCOUNTER — Other Ambulatory Visit: Payer: Self-pay

## 2020-05-31 DIAGNOSIS — M6281 Muscle weakness (generalized): Secondary | ICD-10-CM | POA: Diagnosis not present

## 2020-05-31 DIAGNOSIS — M25551 Pain in right hip: Secondary | ICD-10-CM

## 2020-05-31 DIAGNOSIS — M25651 Stiffness of right hip, not elsewhere classified: Secondary | ICD-10-CM | POA: Diagnosis not present

## 2020-05-31 DIAGNOSIS — R262 Difficulty in walking, not elsewhere classified: Secondary | ICD-10-CM | POA: Diagnosis not present
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
## 2020-06-03 ENCOUNTER — Encounter: Payer: Medicare HMO | Admitting: Physical Therapy

## 2020-06-04 DIAGNOSIS — I10 Essential (primary) hypertension: Secondary | ICD-10-CM | POA: Diagnosis not present

## 2020-06-04 DIAGNOSIS — R69 Illness, unspecified: Secondary | ICD-10-CM | POA: Diagnosis not present

## 2020-06-04 DIAGNOSIS — E7849 Other hyperlipidemia: Secondary | ICD-10-CM | POA: Diagnosis not present

## 2020-06-04 DIAGNOSIS — G894 Chronic pain syndrome: Secondary | ICD-10-CM | POA: Diagnosis not present

## 2020-06-05 ENCOUNTER — Encounter: Payer: Medicare HMO | Admitting: Physical Therapy

## 2020-06-07 ENCOUNTER — Encounter: Payer: Medicare HMO | Admitting: Physical Therapy

## 2020-06-21 DIAGNOSIS — M1611 Unilateral primary osteoarthritis, right hip: Secondary | ICD-10-CM | POA: Diagnosis not present

## 2020-07-05 ENCOUNTER — Encounter: Payer: Self-pay | Admitting: Orthopedic Surgery

## 2020-07-22 DIAGNOSIS — Z23 Encounter for immunization: Secondary | ICD-10-CM | POA: Diagnosis not present

## 2020-07-22 DIAGNOSIS — E663 Overweight: Secondary | ICD-10-CM | POA: Diagnosis not present

## 2020-07-22 DIAGNOSIS — G894 Chronic pain syndrome: Secondary | ICD-10-CM | POA: Diagnosis not present

## 2020-07-22 DIAGNOSIS — R69 Illness, unspecified: Secondary | ICD-10-CM | POA: Diagnosis not present

## 2020-07-22 DIAGNOSIS — Z6826 Body mass index (BMI) 26.0-26.9, adult: Secondary | ICD-10-CM | POA: Diagnosis not present

## 2020-08-03 DIAGNOSIS — I10 Essential (primary) hypertension: Secondary | ICD-10-CM | POA: Diagnosis not present

## 2020-08-03 DIAGNOSIS — E7849 Other hyperlipidemia: Secondary | ICD-10-CM | POA: Diagnosis not present

## 2020-08-03 DIAGNOSIS — R69 Illness, unspecified: Secondary | ICD-10-CM | POA: Diagnosis not present

## 2020-08-03 DIAGNOSIS — G894 Chronic pain syndrome: Secondary | ICD-10-CM | POA: Diagnosis not present

## 2020-08-08 ENCOUNTER — Other Ambulatory Visit: Payer: Self-pay | Admitting: Adult Health

## 2020-10-03 ENCOUNTER — Other Ambulatory Visit: Payer: Self-pay | Admitting: Women's Health

## 2020-10-04 DIAGNOSIS — R69 Illness, unspecified: Secondary | ICD-10-CM | POA: Diagnosis not present

## 2020-10-04 DIAGNOSIS — E7849 Other hyperlipidemia: Secondary | ICD-10-CM | POA: Diagnosis not present

## 2020-10-04 DIAGNOSIS — G894 Chronic pain syndrome: Secondary | ICD-10-CM | POA: Diagnosis not present

## 2020-10-04 DIAGNOSIS — I10 Essential (primary) hypertension: Secondary | ICD-10-CM | POA: Diagnosis not present

## 2020-10-16 DIAGNOSIS — R32 Unspecified urinary incontinence: Secondary | ICD-10-CM | POA: Diagnosis not present

## 2020-10-16 DIAGNOSIS — E663 Overweight: Secondary | ICD-10-CM | POA: Diagnosis not present

## 2020-10-16 DIAGNOSIS — R69 Illness, unspecified: Secondary | ICD-10-CM | POA: Diagnosis not present

## 2020-10-16 DIAGNOSIS — I10 Essential (primary) hypertension: Secondary | ICD-10-CM | POA: Diagnosis not present

## 2020-10-16 DIAGNOSIS — Z6827 Body mass index (BMI) 27.0-27.9, adult: Secondary | ICD-10-CM | POA: Diagnosis not present

## 2020-10-16 DIAGNOSIS — M1990 Unspecified osteoarthritis, unspecified site: Secondary | ICD-10-CM | POA: Diagnosis not present

## 2020-10-16 DIAGNOSIS — Z008 Encounter for other general examination: Secondary | ICD-10-CM | POA: Diagnosis not present

## 2020-10-16 DIAGNOSIS — G8929 Other chronic pain: Secondary | ICD-10-CM | POA: Diagnosis not present

## 2020-10-16 DIAGNOSIS — Z7722 Contact with and (suspected) exposure to environmental tobacco smoke (acute) (chronic): Secondary | ICD-10-CM | POA: Diagnosis not present

## 2020-10-16 DIAGNOSIS — Z79890 Hormone replacement therapy (postmenopausal): Secondary | ICD-10-CM | POA: Diagnosis not present

## 2020-10-16 DIAGNOSIS — Z79891 Long term (current) use of opiate analgesic: Secondary | ICD-10-CM | POA: Diagnosis not present

## 2020-10-18 DIAGNOSIS — M1611 Unilateral primary osteoarthritis, right hip: Secondary | ICD-10-CM | POA: Diagnosis not present

## 2020-11-01 DIAGNOSIS — M1611 Unilateral primary osteoarthritis, right hip: Secondary | ICD-10-CM | POA: Diagnosis not present

## 2020-11-04 DIAGNOSIS — I10 Essential (primary) hypertension: Secondary | ICD-10-CM | POA: Diagnosis not present

## 2020-11-04 DIAGNOSIS — E7849 Other hyperlipidemia: Secondary | ICD-10-CM | POA: Diagnosis not present

## 2020-11-04 DIAGNOSIS — G894 Chronic pain syndrome: Secondary | ICD-10-CM | POA: Diagnosis not present

## 2020-11-06 ENCOUNTER — Other Ambulatory Visit: Payer: Self-pay

## 2020-11-06 ENCOUNTER — Ambulatory Visit: Payer: Medicare HMO | Attending: Orthopedic Surgery | Admitting: Physical Therapy

## 2020-11-06 DIAGNOSIS — M6281 Muscle weakness (generalized): Secondary | ICD-10-CM | POA: Diagnosis present

## 2020-11-06 DIAGNOSIS — R262 Difficulty in walking, not elsewhere classified: Secondary | ICD-10-CM | POA: Diagnosis present

## 2020-11-06 DIAGNOSIS — M25651 Stiffness of right hip, not elsewhere classified: Secondary | ICD-10-CM | POA: Insufficient documentation

## 2020-11-06 DIAGNOSIS — M25551 Pain in right hip: Secondary | ICD-10-CM | POA: Insufficient documentation
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
## 2020-11-11 ENCOUNTER — Ambulatory Visit: Payer: Medicare HMO | Admitting: Obstetrics & Gynecology

## 2020-11-12 ENCOUNTER — Encounter: Payer: Self-pay | Admitting: Physical Therapy

## 2020-11-12 ENCOUNTER — Ambulatory Visit: Payer: Medicare HMO | Admitting: Physical Therapy

## 2020-11-12 ENCOUNTER — Other Ambulatory Visit: Payer: Self-pay

## 2020-11-12 DIAGNOSIS — R262 Difficulty in walking, not elsewhere classified: Secondary | ICD-10-CM

## 2020-11-12 DIAGNOSIS — M6281 Muscle weakness (generalized): Secondary | ICD-10-CM

## 2020-11-12 DIAGNOSIS — M25651 Stiffness of right hip, not elsewhere classified: Secondary | ICD-10-CM | POA: Diagnosis not present

## 2020-11-12 DIAGNOSIS — M25551 Pain in right hip: Secondary | ICD-10-CM | POA: Diagnosis not present
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
## 2020-11-14 ENCOUNTER — Other Ambulatory Visit: Payer: Self-pay

## 2020-11-14 ENCOUNTER — Ambulatory Visit: Payer: Medicare HMO | Admitting: Physical Therapy

## 2020-11-14 DIAGNOSIS — R262 Difficulty in walking, not elsewhere classified: Secondary | ICD-10-CM

## 2020-11-14 DIAGNOSIS — M25651 Stiffness of right hip, not elsewhere classified: Secondary | ICD-10-CM

## 2020-11-14 DIAGNOSIS — M6281 Muscle weakness (generalized): Secondary | ICD-10-CM

## 2020-11-14 DIAGNOSIS — M25551 Pain in right hip: Secondary | ICD-10-CM | POA: Diagnosis not present
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
## 2020-11-19 ENCOUNTER — Other Ambulatory Visit: Payer: Self-pay

## 2020-11-19 ENCOUNTER — Ambulatory Visit: Payer: Medicare HMO | Admitting: *Deleted

## 2020-11-19 DIAGNOSIS — M25651 Stiffness of right hip, not elsewhere classified: Secondary | ICD-10-CM | POA: Diagnosis not present

## 2020-11-19 DIAGNOSIS — M25551 Pain in right hip: Secondary | ICD-10-CM

## 2020-11-19 DIAGNOSIS — R262 Difficulty in walking, not elsewhere classified: Secondary | ICD-10-CM

## 2020-11-19 DIAGNOSIS — M6281 Muscle weakness (generalized): Secondary | ICD-10-CM | POA: Diagnosis not present
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
## 2020-11-21 ENCOUNTER — Other Ambulatory Visit: Payer: Self-pay

## 2020-11-21 ENCOUNTER — Ambulatory Visit: Payer: Medicare HMO | Admitting: Physical Therapy

## 2020-11-21 DIAGNOSIS — R262 Difficulty in walking, not elsewhere classified: Secondary | ICD-10-CM | POA: Diagnosis not present

## 2020-11-21 DIAGNOSIS — M25551 Pain in right hip: Secondary | ICD-10-CM

## 2020-11-21 DIAGNOSIS — M6281 Muscle weakness (generalized): Secondary | ICD-10-CM | POA: Diagnosis not present

## 2020-11-21 DIAGNOSIS — M25651 Stiffness of right hip, not elsewhere classified: Secondary | ICD-10-CM | POA: Diagnosis not present
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
## 2020-11-26 ENCOUNTER — Ambulatory Visit: Payer: Medicare HMO | Admitting: Physical Therapy

## 2020-11-26 ENCOUNTER — Encounter: Payer: Self-pay | Admitting: Physical Therapy

## 2020-11-26 ENCOUNTER — Other Ambulatory Visit: Payer: Self-pay

## 2020-11-26 DIAGNOSIS — M25551 Pain in right hip: Secondary | ICD-10-CM | POA: Diagnosis not present

## 2020-11-26 DIAGNOSIS — R262 Difficulty in walking, not elsewhere classified: Secondary | ICD-10-CM | POA: Diagnosis not present

## 2020-11-26 DIAGNOSIS — M6281 Muscle weakness (generalized): Secondary | ICD-10-CM | POA: Diagnosis not present

## 2020-11-26 DIAGNOSIS — M25651 Stiffness of right hip, not elsewhere classified: Secondary | ICD-10-CM | POA: Diagnosis not present
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
## 2020-11-28 ENCOUNTER — Encounter: Payer: Medicare HMO | Admitting: Physical Therapy

## 2020-12-03 ENCOUNTER — Ambulatory Visit: Payer: Medicare HMO | Attending: Orthopedic Surgery | Admitting: Physical Therapy

## 2020-12-03 ENCOUNTER — Other Ambulatory Visit: Payer: Self-pay

## 2020-12-03 DIAGNOSIS — M25551 Pain in right hip: Secondary | ICD-10-CM | POA: Diagnosis not present

## 2020-12-03 DIAGNOSIS — R262 Difficulty in walking, not elsewhere classified: Secondary | ICD-10-CM | POA: Diagnosis not present
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
## 2020-12-05 ENCOUNTER — Other Ambulatory Visit: Payer: Self-pay | Admitting: Women's Health

## 2020-12-05 ENCOUNTER — Ambulatory Visit: Payer: Medicare HMO | Admitting: Physical Therapy

## 2020-12-05 ENCOUNTER — Other Ambulatory Visit: Payer: Self-pay

## 2020-12-05 DIAGNOSIS — R262 Difficulty in walking, not elsewhere classified: Secondary | ICD-10-CM

## 2020-12-05 DIAGNOSIS — M25551 Pain in right hip: Secondary | ICD-10-CM | POA: Diagnosis not present
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
## 2020-12-06 DIAGNOSIS — M1611 Unilateral primary osteoarthritis, right hip: Secondary | ICD-10-CM | POA: Diagnosis not present

## 2020-12-10 ENCOUNTER — Ambulatory Visit: Payer: Medicare HMO | Admitting: Physical Therapy

## 2020-12-11 DIAGNOSIS — J069 Acute upper respiratory infection, unspecified: Secondary | ICD-10-CM | POA: Diagnosis not present

## 2020-12-12 ENCOUNTER — Encounter: Payer: Medicare HMO | Admitting: Physical Therapy

## 2021-01-01 DIAGNOSIS — R69 Illness, unspecified: Secondary | ICD-10-CM | POA: Diagnosis not present

## 2021-01-01 DIAGNOSIS — I10 Essential (primary) hypertension: Secondary | ICD-10-CM | POA: Diagnosis not present

## 2021-01-01 DIAGNOSIS — G894 Chronic pain syndrome: Secondary | ICD-10-CM | POA: Diagnosis not present

## 2021-01-01 DIAGNOSIS — E7849 Other hyperlipidemia: Secondary | ICD-10-CM | POA: Diagnosis not present

## 2021-02-14 ENCOUNTER — Other Ambulatory Visit: Payer: Self-pay | Admitting: Women's Health

## 2021-02-17 DIAGNOSIS — E7849 Other hyperlipidemia: Secondary | ICD-10-CM | POA: Diagnosis not present

## 2021-02-17 DIAGNOSIS — I639 Cerebral infarction, unspecified: Secondary | ICD-10-CM | POA: Diagnosis not present

## 2021-02-17 DIAGNOSIS — R69 Illness, unspecified: Secondary | ICD-10-CM | POA: Diagnosis not present

## 2021-02-17 DIAGNOSIS — Z0001 Encounter for general adult medical examination with abnormal findings: Secondary | ICD-10-CM | POA: Diagnosis not present

## 2021-02-17 DIAGNOSIS — M797 Fibromyalgia: Secondary | ICD-10-CM | POA: Diagnosis not present

## 2021-02-17 DIAGNOSIS — Z1331 Encounter for screening for depression: Secondary | ICD-10-CM | POA: Diagnosis not present

## 2021-02-17 DIAGNOSIS — R7309 Other abnormal glucose: Secondary | ICD-10-CM | POA: Diagnosis not present

## 2021-02-17 DIAGNOSIS — Z6829 Body mass index (BMI) 29.0-29.9, adult: Secondary | ICD-10-CM | POA: Diagnosis not present

## 2021-02-17 DIAGNOSIS — I10 Essential (primary) hypertension: Secondary | ICD-10-CM | POA: Diagnosis not present

## 2021-02-20 ENCOUNTER — Other Ambulatory Visit: Payer: Self-pay

## 2021-02-20 ENCOUNTER — Other Ambulatory Visit: Payer: Self-pay | Admitting: Obstetrics & Gynecology

## 2021-02-20 ENCOUNTER — Encounter: Payer: Self-pay | Admitting: Obstetrics & Gynecology

## 2021-02-20 ENCOUNTER — Ambulatory Visit (INDEPENDENT_AMBULATORY_CARE_PROVIDER_SITE_OTHER): Payer: Medicare HMO | Admitting: Obstetrics & Gynecology

## 2021-02-20 VITALS — BP 135/74 | HR 83 | Ht 62.0 in | Wt 160.0 lb

## 2021-02-20 DIAGNOSIS — N3941 Urge incontinence: Secondary | ICD-10-CM | POA: Diagnosis not present

## 2021-02-20 DIAGNOSIS — N951 Menopausal and female climacteric states: Secondary | ICD-10-CM | POA: Diagnosis not present

## 2021-02-20 MED ORDER — SOLIFENACIN SUCCINATE 10 MG PO TABS: ORAL_TABLET | ORAL | 11 refills | Status: DC

## 2021-02-20 MED ORDER — ESTRADIOL 1 MG PO TABS: 1.0000 mg | ORAL_TABLET | Freq: Every day | ORAL | 11 refills | Status: DC
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
## 2021-02-26 IMAGING — MG MM DIGITAL SCREENING BILAT W/ TOMO W/ CAD
8 series · 8 of 24 positions shown · non-contrast
Comparison: Previous exam(s).

CLINICAL DATA: Screening.

EXAM:
DIGITAL SCREENING BILATERAL MAMMOGRAM WITH TOMO AND CAD

[R MLO synth-2D]
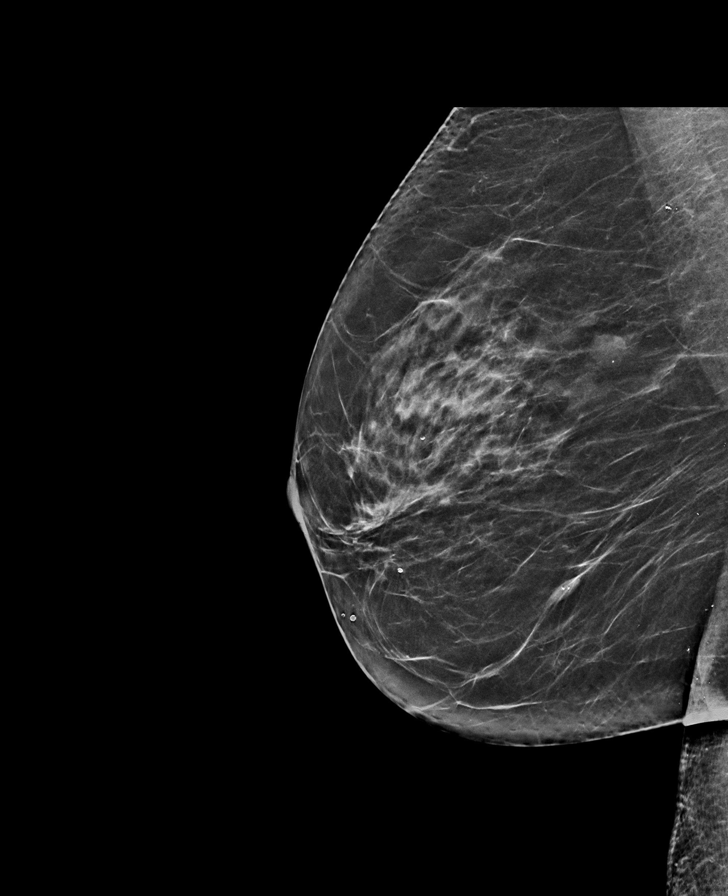

[L CC synth-2D]
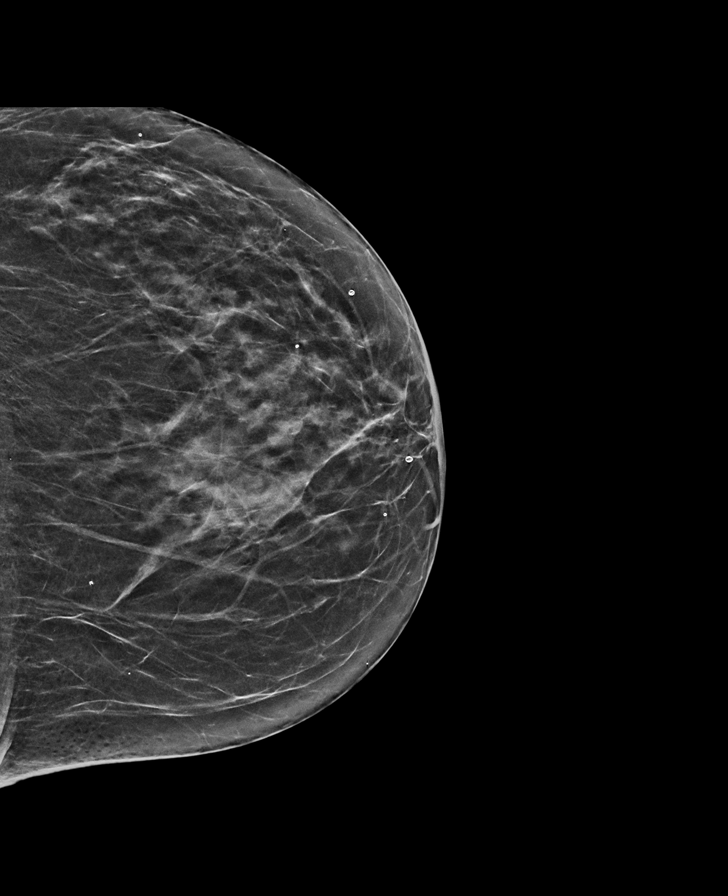

[R CC synth-2D]
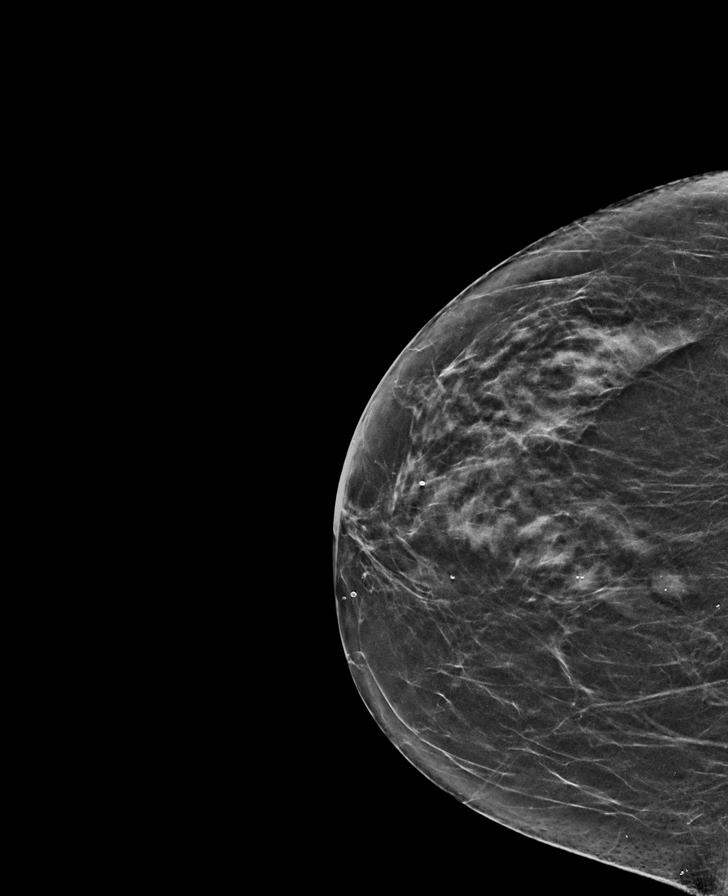

[L MLO synth-2D]
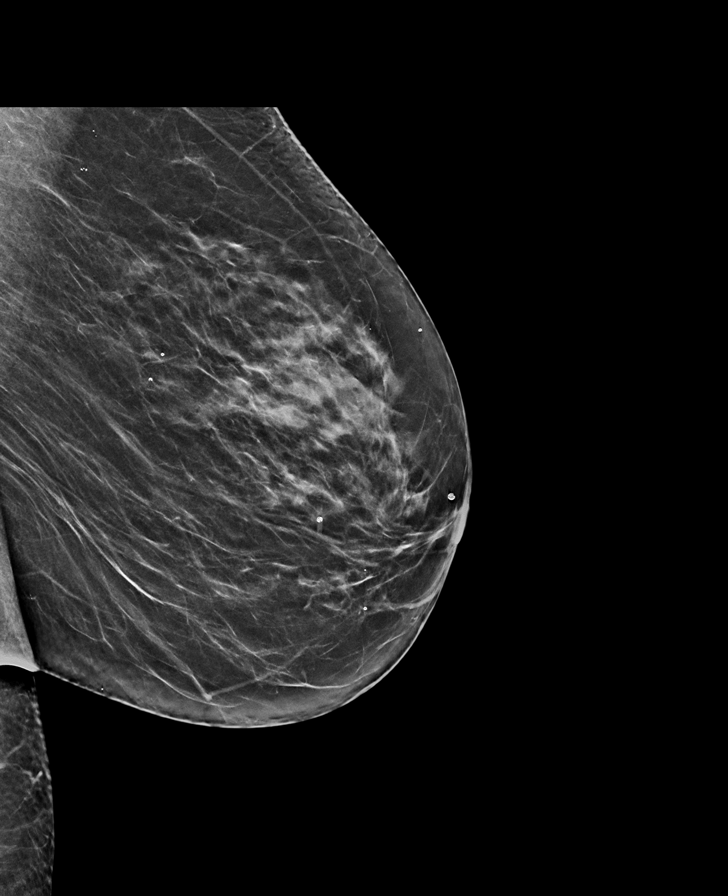

[L CC tomo · tomo slice 27/53.0]
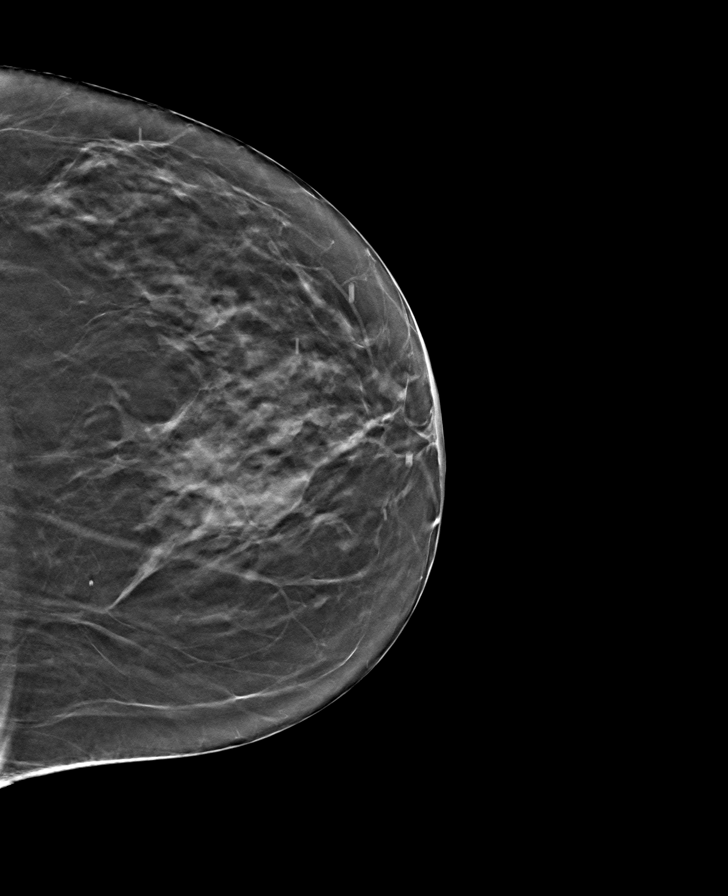

[R CC tomo · tomo slice 29/57.0]
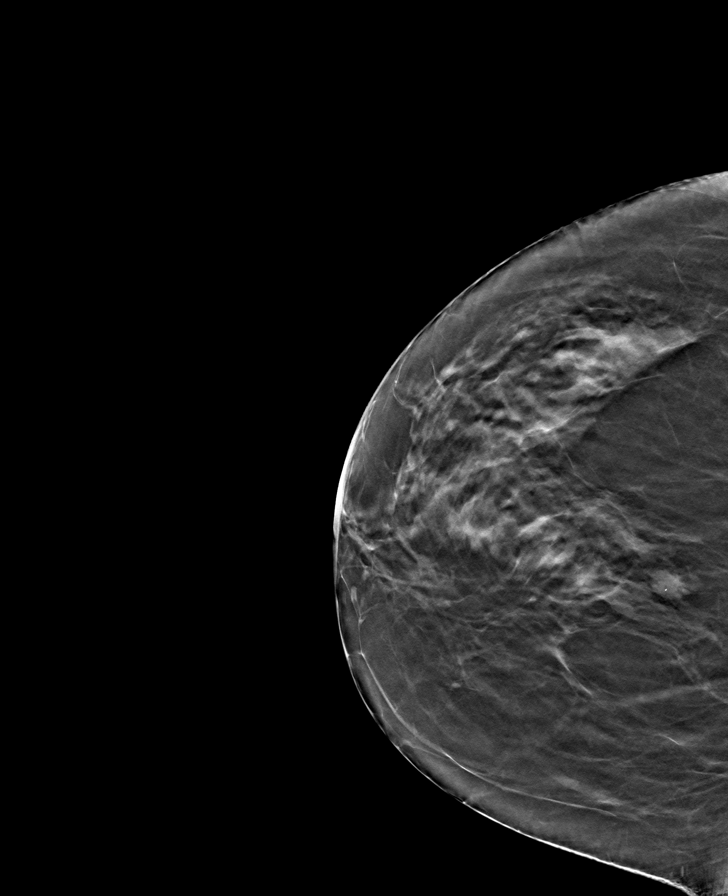

[R MLO tomo · tomo slice 32/63.0]
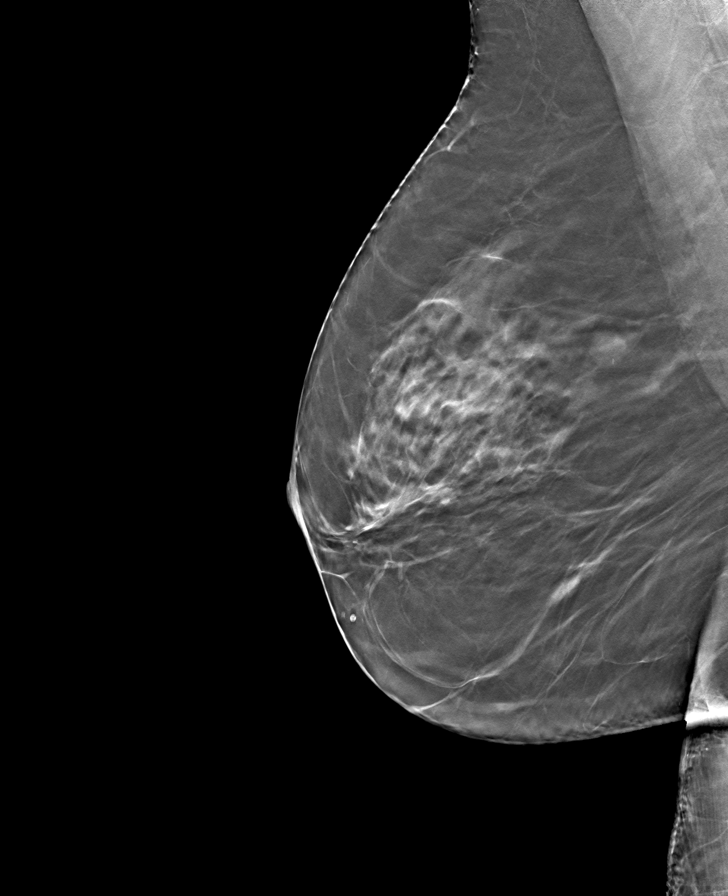

[L MLO tomo · tomo slice 31/62.0]
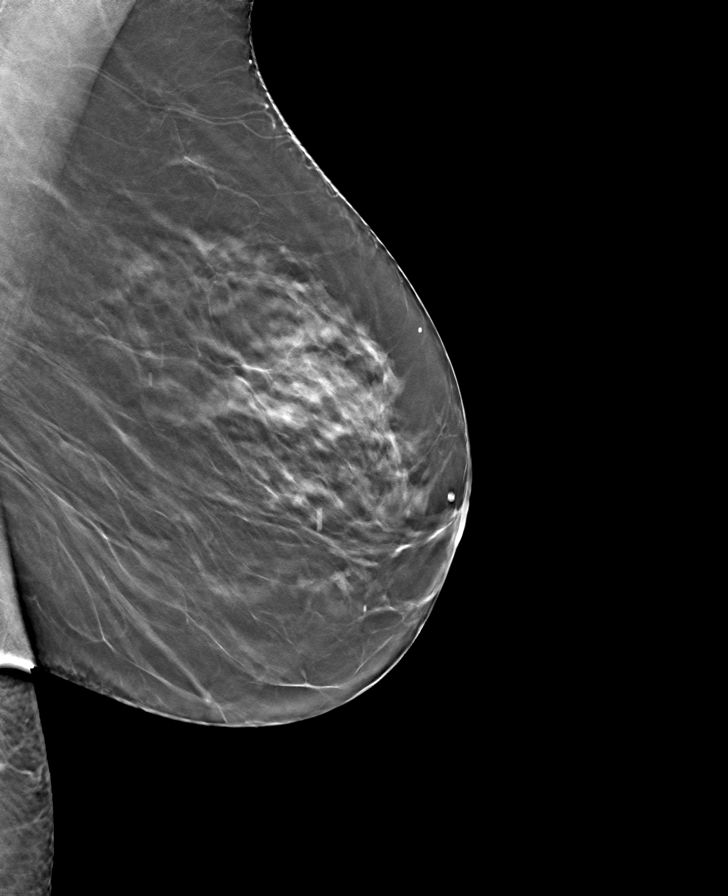

[8 of 24 positions shown; findings below may reference images not displayed]

ACR Breast Density Category c: The breast tissue is heterogeneously
dense, which may obscure small masses.
FINDINGS: There are no findings suspicious for malignancy. Images were
processed with CAD.
IMPRESSION: No mammographic evidence of malignancy. A result letter of this
screening mammogram will be mailed directly to the patient.

RECOMMENDATION:
Screening mammogram in one year. (Code:[id number])

BI-RADS CATEGORY  1: Negative.

## 2021-03-04 DIAGNOSIS — G894 Chronic pain syndrome: Secondary | ICD-10-CM | POA: Diagnosis not present

## 2021-03-04 DIAGNOSIS — E7849 Other hyperlipidemia: Secondary | ICD-10-CM | POA: Diagnosis not present

## 2021-03-04 DIAGNOSIS — I10 Essential (primary) hypertension: Secondary | ICD-10-CM | POA: Diagnosis not present

## 2021-03-16 ENCOUNTER — Other Ambulatory Visit: Payer: Self-pay | Admitting: Obstetrics & Gynecology

## 2021-03-17 DIAGNOSIS — J301 Allergic rhinitis due to pollen: Secondary | ICD-10-CM | POA: Diagnosis not present

## 2021-03-17 DIAGNOSIS — J019 Acute sinusitis, unspecified: Secondary | ICD-10-CM | POA: Diagnosis not present

## 2021-04-09 DIAGNOSIS — M1611 Unilateral primary osteoarthritis, right hip: Secondary | ICD-10-CM | POA: Diagnosis not present

## 2021-04-14 ENCOUNTER — Emergency Department (HOSPITAL_COMMUNITY)
Admission: EM | Admit: 2021-04-14 | Discharge: 2021-04-14 | Disposition: A | Discharge status: Home / Self Care | Payer: Medicare HMO | Attending: Emergency Medicine | Admitting: Emergency Medicine

## 2021-04-14 ENCOUNTER — Other Ambulatory Visit: Payer: Self-pay

## 2021-04-14 ENCOUNTER — Emergency Department (HOSPITAL_COMMUNITY): Payer: Medicare HMO

## 2021-04-14 ENCOUNTER — Encounter (HOSPITAL_COMMUNITY): Payer: Self-pay

## 2021-04-14 DIAGNOSIS — Z79899 Other long term (current) drug therapy: Secondary | ICD-10-CM | POA: Insufficient documentation

## 2021-04-14 DIAGNOSIS — R0789 Other chest pain: Secondary | ICD-10-CM | POA: Insufficient documentation

## 2021-04-14 DIAGNOSIS — Z743 Need for continuous supervision: Secondary | ICD-10-CM | POA: Diagnosis not present

## 2021-04-14 DIAGNOSIS — R079 Chest pain, unspecified: Secondary | ICD-10-CM | POA: Diagnosis not present

## 2021-04-14 DIAGNOSIS — K449 Diaphragmatic hernia without obstruction or gangrene: Secondary | ICD-10-CM | POA: Diagnosis not present

## 2021-04-14 DIAGNOSIS — R55 Syncope and collapse: Secondary | ICD-10-CM | POA: Insufficient documentation

## 2021-04-14 DIAGNOSIS — Z7982 Long term (current) use of aspirin: Secondary | ICD-10-CM | POA: Diagnosis not present

## 2021-04-14 DIAGNOSIS — R0689 Other abnormalities of breathing: Secondary | ICD-10-CM | POA: Diagnosis not present

## 2021-04-14 DIAGNOSIS — I10 Essential (primary) hypertension: Secondary | ICD-10-CM | POA: Diagnosis not present

## 2021-04-14 DIAGNOSIS — Z20822 Contact with and (suspected) exposure to covid-19: Secondary | ICD-10-CM | POA: Diagnosis not present

## 2021-04-14 DIAGNOSIS — Z96641 Presence of right artificial hip joint: Secondary | ICD-10-CM | POA: Insufficient documentation

## 2021-04-14 DIAGNOSIS — R000 Tachycardia, unspecified: Secondary | ICD-10-CM | POA: Diagnosis not present

## 2021-04-14 DIAGNOSIS — I517 Cardiomegaly: Secondary | ICD-10-CM | POA: Diagnosis not present

## 2021-04-14 LAB — CBC WITH DIFFERENTIAL/PLATELET
Abs Immature Granulocytes: 0.04 10*3/uL (ref 0.00–0.07)
Basophils Absolute: 0.1 10*3/uL (ref 0.0–0.1)
Basophils Relative: 1 %
Eosinophils Absolute: 0.1 10*3/uL (ref 0.0–0.5)
Eosinophils Relative: 0 %
HCT: 39.2 % (ref 36.0–46.0)
Hemoglobin: 13.2 g/dL (ref 12.0–15.0)
Immature Granulocytes: 0 %
Lymphocytes Relative: 26 %
Lymphs Abs: 3.1 10*3/uL (ref 0.7–4.0)
MCH: 30.5 pg (ref 26.0–34.0)
MCHC: 33.7 g/dL (ref 30.0–36.0)
MCV: 90.5 fL (ref 80.0–100.0)
Monocytes Absolute: 1 10*3/uL (ref 0.1–1.0)
Monocytes Relative: 8 %
Neutro Abs: 7.7 10*3/uL (ref 1.7–7.7)
Neutrophils Relative %: 65 %
Platelets: 321 10*3/uL (ref 150–400)
RBC: 4.33 MIL/uL (ref 3.87–5.11)
RDW: 12.6 % (ref 11.5–15.5)
WBC: 12 10*3/uL — ABNORMAL HIGH (ref 4.0–10.5)
nRBC: 0 % (ref 0.0–0.2)

## 2021-04-14 LAB — RESP PANEL BY RT-PCR (FLU A&B, COVID) ARPGX2
Influenza A by PCR: NEGATIVE
Influenza B by PCR: NEGATIVE
SARS Coronavirus 2 by RT PCR: NEGATIVE

## 2021-04-14 LAB — COMPREHENSIVE METABOLIC PANEL
ALT: 19 U/L (ref 0–44)
AST: 15 U/L (ref 15–41)
Albumin: 3.9 g/dL (ref 3.5–5.0)
Alkaline Phosphatase: 68 U/L (ref 38–126)
Anion gap: 8 (ref 5–15)
BUN: 28 mg/dL — ABNORMAL HIGH (ref 8–23)
CO2: 22 mmol/L (ref 22–32)
Calcium: 9.6 mg/dL (ref 8.9–10.3)
Chloride: 109 mmol/L (ref 98–111)
Creatinine, Ser: 0.8 mg/dL (ref 0.44–1.00)
GFR, Estimated: >60 mL/min (ref >60)
Glucose, Bld: 102 mg/dL — ABNORMAL HIGH (ref 70–99)
Potassium: 3.7 mmol/L (ref 3.5–5.1)
Sodium: 139 mmol/L (ref 135–145)
Total Bilirubin: 0.2 mg/dL — ABNORMAL LOW (ref 0.3–1.2)
Total Protein: 6.9 g/dL (ref 6.5–8.1)

## 2021-04-14 LAB — TROPONIN I (HIGH SENSITIVITY)
Troponin I (High Sensitivity): <2 ng/L (ref <18)
Troponin I (High Sensitivity): 2 ng/L (ref <18)

## 2021-04-14 LAB — D-DIMER, QUANTITATIVE: D-Dimer, Quant: 0.73 ug/mL-FEU — ABNORMAL HIGH (ref 0.00–0.50)

## 2021-04-14 MED ORDER — IOHEXOL 350 MG/ML SOLN
100.0000 mL | Freq: Once | INTRAVENOUS | Status: AC | PRN
  Administered 2021-04-14: 100 mL via INTRAVENOUS

## 2021-04-14 NOTE — ED Notes (Signed)
Pt reports she has no chest pain at this time and wants to go home, EDP made aware, EDP to see patient.

## 2021-04-14 NOTE — Discharge Instructions (Addendum)
The test today did not show any serious problems to explain your discomfort and feeling like you are going to pass out.  Make sure you are getting plenty of rest and drinking a lot of fluids.  Follow-up with your doctor if not better next week.

## 2021-04-14 NOTE — ED Triage Notes (Addendum)
Pt presents to ED via [facility] for chest pain around left breast describes as a cramping, no radiation, no N/V started Saturday. 20 G L wrist. 324 mg ASA, Nitro x 2 with decrease of pain from 6 to 2 PTA. Pt with hx CHF. CBG 210
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
## 2021-04-14 NOTE — ED Notes (Signed)
Pt returned from CT °

## 2021-04-24 DIAGNOSIS — Z0001 Encounter for general adult medical examination with abnormal findings: Secondary | ICD-10-CM | POA: Diagnosis not present

## 2021-04-24 DIAGNOSIS — Z1389 Encounter for screening for other disorder: Secondary | ICD-10-CM | POA: Diagnosis not present

## 2021-04-24 DIAGNOSIS — M797 Fibromyalgia: Secondary | ICD-10-CM | POA: Diagnosis not present

## 2021-04-24 DIAGNOSIS — E782 Mixed hyperlipidemia: Secondary | ICD-10-CM | POA: Diagnosis not present

## 2021-04-24 DIAGNOSIS — I10 Essential (primary) hypertension: Secondary | ICD-10-CM | POA: Diagnosis not present

## 2021-05-05 DIAGNOSIS — E039 Hypothyroidism, unspecified: Secondary | ICD-10-CM | POA: Diagnosis not present

## 2021-05-05 DIAGNOSIS — E663 Overweight: Secondary | ICD-10-CM | POA: Diagnosis not present

## 2021-05-05 DIAGNOSIS — Z6828 Body mass index (BMI) 28.0-28.9, adult: Secondary | ICD-10-CM | POA: Diagnosis not present

## 2021-06-05 ENCOUNTER — Other Ambulatory Visit (HOSPITAL_COMMUNITY): Payer: Self-pay | Admitting: Family Medicine

## 2021-06-05 DIAGNOSIS — E663 Overweight: Secondary | ICD-10-CM | POA: Diagnosis not present

## 2021-06-05 DIAGNOSIS — E039 Hypothyroidism, unspecified: Secondary | ICD-10-CM | POA: Diagnosis not present

## 2021-06-05 DIAGNOSIS — Z1231 Encounter for screening mammogram for malignant neoplasm of breast: Secondary | ICD-10-CM

## 2021-06-05 DIAGNOSIS — Z6828 Body mass index (BMI) 28.0-28.9, adult: Secondary | ICD-10-CM | POA: Diagnosis not present

## 2021-06-05 DIAGNOSIS — D72829 Elevated white blood cell count, unspecified: Secondary | ICD-10-CM | POA: Diagnosis not present

## 2021-06-16 DIAGNOSIS — M25511 Pain in right shoulder: Secondary | ICD-10-CM | POA: Diagnosis not present

## 2021-06-16 DIAGNOSIS — M542 Cervicalgia: Secondary | ICD-10-CM | POA: Diagnosis not present

## 2021-06-18 ENCOUNTER — Other Ambulatory Visit: Payer: Self-pay

## 2021-06-18 ENCOUNTER — Ambulatory Visit (HOSPITAL_COMMUNITY)
Admission: RE | Admit: 2021-06-18 | Discharge: 2021-06-18 | Disposition: A | Discharge status: Home / Self Care | Payer: Medicare HMO | Source: Ambulatory Visit | Attending: Family Medicine | Admitting: Family Medicine

## 2021-06-18 DIAGNOSIS — Z1231 Encounter for screening mammogram for malignant neoplasm of breast: Secondary | ICD-10-CM | POA: Diagnosis not present

## 2021-06-24 ENCOUNTER — Ambulatory Visit: Payer: Medicare HMO | Attending: Orthopedic Surgery | Admitting: Physical Therapy

## 2021-06-24 ENCOUNTER — Other Ambulatory Visit: Payer: Self-pay

## 2021-06-24 DIAGNOSIS — R293 Abnormal posture: Secondary | ICD-10-CM | POA: Insufficient documentation

## 2021-06-24 DIAGNOSIS — M542 Cervicalgia: Secondary | ICD-10-CM | POA: Insufficient documentation
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
## 2021-06-30 ENCOUNTER — Other Ambulatory Visit: Payer: Self-pay

## 2021-06-30 ENCOUNTER — Ambulatory Visit: Payer: Medicare HMO | Admitting: Physical Therapy

## 2021-06-30 ENCOUNTER — Encounter: Payer: Self-pay | Admitting: Physical Therapy

## 2021-06-30 DIAGNOSIS — M542 Cervicalgia: Secondary | ICD-10-CM | POA: Diagnosis not present

## 2021-06-30 DIAGNOSIS — R293 Abnormal posture: Secondary | ICD-10-CM | POA: Diagnosis not present
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
## 2021-07-03 ENCOUNTER — Ambulatory Visit: Payer: Medicare HMO | Admitting: Physical Therapy

## 2021-07-03 ENCOUNTER — Other Ambulatory Visit: Payer: Self-pay

## 2021-07-03 ENCOUNTER — Encounter: Payer: Self-pay | Admitting: Physical Therapy

## 2021-07-03 DIAGNOSIS — R293 Abnormal posture: Secondary | ICD-10-CM

## 2021-07-03 DIAGNOSIS — M542 Cervicalgia: Secondary | ICD-10-CM
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
## 2021-07-04 ENCOUNTER — Ambulatory Visit: Payer: Medicare HMO | Admitting: Physical Therapy

## 2021-07-08 ENCOUNTER — Other Ambulatory Visit: Payer: Self-pay

## 2021-07-08 ENCOUNTER — Ambulatory Visit: Payer: Medicare HMO | Attending: Orthopedic Surgery

## 2021-07-08 DIAGNOSIS — M542 Cervicalgia: Secondary | ICD-10-CM | POA: Diagnosis not present

## 2021-07-08 DIAGNOSIS — R293 Abnormal posture: Secondary | ICD-10-CM | POA: Insufficient documentation
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
## 2021-07-11 ENCOUNTER — Other Ambulatory Visit: Payer: Self-pay

## 2021-07-11 ENCOUNTER — Encounter: Payer: Self-pay | Admitting: Physical Therapy

## 2021-07-11 ENCOUNTER — Ambulatory Visit: Payer: Medicare HMO | Admitting: Physical Therapy

## 2021-07-11 DIAGNOSIS — R293 Abnormal posture: Secondary | ICD-10-CM | POA: Diagnosis not present

## 2021-07-11 DIAGNOSIS — M542 Cervicalgia: Secondary | ICD-10-CM
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
## 2021-07-15 ENCOUNTER — Ambulatory Visit: Payer: Medicare HMO | Admitting: Physical Therapy

## 2021-07-15 ENCOUNTER — Encounter: Payer: Self-pay | Admitting: Physical Therapy

## 2021-07-15 ENCOUNTER — Other Ambulatory Visit: Payer: Self-pay

## 2021-07-15 DIAGNOSIS — M542 Cervicalgia: Secondary | ICD-10-CM | POA: Diagnosis not present

## 2021-07-15 DIAGNOSIS — R293 Abnormal posture: Secondary | ICD-10-CM
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
## 2021-07-18 ENCOUNTER — Ambulatory Visit: Payer: Medicare HMO | Admitting: *Deleted

## 2021-07-18 ENCOUNTER — Other Ambulatory Visit: Payer: Self-pay

## 2021-07-18 DIAGNOSIS — R293 Abnormal posture: Secondary | ICD-10-CM | POA: Diagnosis not present

## 2021-07-18 DIAGNOSIS — M542 Cervicalgia: Secondary | ICD-10-CM | POA: Diagnosis not present
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
## 2021-07-23 ENCOUNTER — Ambulatory Visit: Payer: Medicare HMO

## 2021-07-23 ENCOUNTER — Other Ambulatory Visit: Payer: Self-pay

## 2021-07-23 DIAGNOSIS — M542 Cervicalgia: Secondary | ICD-10-CM

## 2021-07-23 DIAGNOSIS — R293 Abnormal posture: Secondary | ICD-10-CM | POA: Diagnosis not present
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
## 2021-07-23 NOTE — Addendum Note (Signed)
Addended by: , Mali W on: 07/23/2021 01:50 PM   Modules accepted: Orders

## 2021-07-28 DIAGNOSIS — M25511 Pain in right shoulder: Secondary | ICD-10-CM | POA: Diagnosis not present

## 2021-08-01 DIAGNOSIS — M542 Cervicalgia: Secondary | ICD-10-CM | POA: Diagnosis not present

## 2021-08-06 DIAGNOSIS — M542 Cervicalgia: Secondary | ICD-10-CM | POA: Diagnosis not present

## 2021-08-06 DIAGNOSIS — M5032 Other cervical disc degeneration, mid-cervical region, unspecified level: Secondary | ICD-10-CM | POA: Diagnosis not present

## 2021-08-06 DIAGNOSIS — M5412 Radiculopathy, cervical region: Secondary | ICD-10-CM | POA: Diagnosis not present

## 2021-08-18 DIAGNOSIS — M5412 Radiculopathy, cervical region: Secondary | ICD-10-CM | POA: Diagnosis not present

## 2021-08-18 DIAGNOSIS — M5032 Other cervical disc degeneration, mid-cervical region, unspecified level: Secondary | ICD-10-CM | POA: Diagnosis not present

## 2021-09-02 DIAGNOSIS — M5032 Other cervical disc degeneration, mid-cervical region, unspecified level: Secondary | ICD-10-CM | POA: Diagnosis not present

## 2021-09-02 DIAGNOSIS — M5412 Radiculopathy, cervical region: Secondary | ICD-10-CM | POA: Diagnosis not present

## 2021-09-12 DIAGNOSIS — M5412 Radiculopathy, cervical region: Secondary | ICD-10-CM | POA: Diagnosis not present

## 2021-10-07 DIAGNOSIS — J069 Acute upper respiratory infection, unspecified: Secondary | ICD-10-CM | POA: Diagnosis not present

## 2021-10-20 DIAGNOSIS — G8929 Other chronic pain: Secondary | ICD-10-CM | POA: Diagnosis not present

## 2021-10-20 DIAGNOSIS — E663 Overweight: Secondary | ICD-10-CM | POA: Diagnosis not present

## 2021-10-20 DIAGNOSIS — M810 Age-related osteoporosis without current pathological fracture: Secondary | ICD-10-CM | POA: Diagnosis not present

## 2021-10-20 DIAGNOSIS — J301 Allergic rhinitis due to pollen: Secondary | ICD-10-CM | POA: Diagnosis not present

## 2021-10-20 DIAGNOSIS — E039 Hypothyroidism, unspecified: Secondary | ICD-10-CM | POA: Diagnosis not present

## 2021-10-20 DIAGNOSIS — K219 Gastro-esophageal reflux disease without esophagitis: Secondary | ICD-10-CM | POA: Diagnosis not present

## 2021-10-20 DIAGNOSIS — N3941 Urge incontinence: Secondary | ICD-10-CM | POA: Diagnosis not present

## 2021-10-20 DIAGNOSIS — I10 Essential (primary) hypertension: Secondary | ICD-10-CM | POA: Diagnosis not present

## 2021-10-20 DIAGNOSIS — R69 Illness, unspecified: Secondary | ICD-10-CM | POA: Diagnosis not present

## 2021-10-20 DIAGNOSIS — Z008 Encounter for other general examination: Secondary | ICD-10-CM | POA: Diagnosis not present

## 2021-10-20 DIAGNOSIS — M1990 Unspecified osteoarthritis, unspecified site: Secondary | ICD-10-CM | POA: Diagnosis not present

## 2021-10-20 DIAGNOSIS — Z6829 Body mass index (BMI) 29.0-29.9, adult: Secondary | ICD-10-CM | POA: Diagnosis not present

## 2021-10-20 DIAGNOSIS — R609 Edema, unspecified: Secondary | ICD-10-CM | POA: Diagnosis not present

## 2021-10-20 DIAGNOSIS — F419 Anxiety disorder, unspecified: Secondary | ICD-10-CM | POA: Diagnosis not present

## 2021-10-23 DIAGNOSIS — M5032 Other cervical disc degeneration, mid-cervical region, unspecified level: Secondary | ICD-10-CM | POA: Diagnosis not present

## 2021-10-23 DIAGNOSIS — M542 Cervicalgia: Secondary | ICD-10-CM | POA: Diagnosis not present

## 2021-12-03 DIAGNOSIS — R69 Illness, unspecified: Secondary | ICD-10-CM | POA: Diagnosis not present

## 2021-12-03 DIAGNOSIS — J069 Acute upper respiratory infection, unspecified: Secondary | ICD-10-CM | POA: Diagnosis not present

## 2021-12-03 DIAGNOSIS — F419 Anxiety disorder, unspecified: Secondary | ICD-10-CM | POA: Diagnosis not present

## 2021-12-03 DIAGNOSIS — E6609 Other obesity due to excess calories: Secondary | ICD-10-CM | POA: Diagnosis not present

## 2021-12-03 DIAGNOSIS — M797 Fibromyalgia: Secondary | ICD-10-CM | POA: Diagnosis not present

## 2021-12-15 DIAGNOSIS — M25551 Pain in right hip: Secondary | ICD-10-CM | POA: Diagnosis not present

## 2021-12-15 DIAGNOSIS — M545 Low back pain, unspecified: Secondary | ICD-10-CM | POA: Diagnosis not present

## 2021-12-18 ENCOUNTER — Ambulatory Visit: Payer: Medicare HMO | Attending: Orthopedic Surgery | Admitting: Physical Therapy

## 2021-12-18 ENCOUNTER — Encounter: Payer: Self-pay | Admitting: Physical Therapy

## 2021-12-18 ENCOUNTER — Other Ambulatory Visit: Payer: Self-pay

## 2021-12-18 DIAGNOSIS — M25551 Pain in right hip: Secondary | ICD-10-CM | POA: Insufficient documentation

## 2021-12-18 DIAGNOSIS — M6281 Muscle weakness (generalized): Secondary | ICD-10-CM | POA: Diagnosis present

## 2021-12-18 DIAGNOSIS — R262 Difficulty in walking, not elsewhere classified: Secondary | ICD-10-CM | POA: Diagnosis present
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
## 2021-12-22 ENCOUNTER — Ambulatory Visit: Payer: Medicare HMO

## 2021-12-22 ENCOUNTER — Other Ambulatory Visit: Payer: Self-pay

## 2021-12-22 DIAGNOSIS — M6281 Muscle weakness (generalized): Secondary | ICD-10-CM

## 2021-12-22 DIAGNOSIS — R262 Difficulty in walking, not elsewhere classified: Secondary | ICD-10-CM

## 2021-12-22 DIAGNOSIS — M25551 Pain in right hip: Secondary | ICD-10-CM
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
## 2021-12-26 ENCOUNTER — Ambulatory Visit: Payer: Medicare HMO | Admitting: Physical Therapy

## 2021-12-26 ENCOUNTER — Other Ambulatory Visit: Payer: Self-pay

## 2021-12-26 DIAGNOSIS — R262 Difficulty in walking, not elsewhere classified: Secondary | ICD-10-CM | POA: Diagnosis not present

## 2021-12-26 DIAGNOSIS — M25551 Pain in right hip: Secondary | ICD-10-CM | POA: Diagnosis not present

## 2021-12-26 DIAGNOSIS — M6281 Muscle weakness (generalized): Secondary | ICD-10-CM | POA: Diagnosis not present
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
## 2021-12-30 ENCOUNTER — Ambulatory Visit: Payer: Medicare HMO | Admitting: Physical Therapy

## 2021-12-30 ENCOUNTER — Other Ambulatory Visit: Payer: Self-pay

## 2021-12-30 DIAGNOSIS — M25551 Pain in right hip: Secondary | ICD-10-CM | POA: Diagnosis not present

## 2021-12-30 DIAGNOSIS — R262 Difficulty in walking, not elsewhere classified: Secondary | ICD-10-CM | POA: Diagnosis not present

## 2021-12-30 DIAGNOSIS — M6281 Muscle weakness (generalized): Secondary | ICD-10-CM | POA: Diagnosis not present
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
## 2022-01-02 ENCOUNTER — Ambulatory Visit: Payer: Medicare HMO | Admitting: Physical Therapy

## 2022-01-02 ENCOUNTER — Encounter: Payer: Self-pay | Admitting: Physical Therapy

## 2022-01-02 DIAGNOSIS — R262 Difficulty in walking, not elsewhere classified: Secondary | ICD-10-CM

## 2022-01-02 DIAGNOSIS — M25551 Pain in right hip: Secondary | ICD-10-CM | POA: Diagnosis not present

## 2022-01-02 DIAGNOSIS — M6281 Muscle weakness (generalized): Secondary | ICD-10-CM
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
## 2022-01-06 ENCOUNTER — Ambulatory Visit: Payer: Medicare HMO | Admitting: Physical Therapy

## 2022-01-08 ENCOUNTER — Ambulatory Visit: Payer: Medicare HMO | Attending: Orthopedic Surgery

## 2022-01-08 DIAGNOSIS — M25551 Pain in right hip: Secondary | ICD-10-CM | POA: Diagnosis not present

## 2022-01-08 DIAGNOSIS — M6281 Muscle weakness (generalized): Secondary | ICD-10-CM | POA: Insufficient documentation

## 2022-01-08 DIAGNOSIS — R262 Difficulty in walking, not elsewhere classified: Secondary | ICD-10-CM | POA: Insufficient documentation
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
## 2022-01-13 ENCOUNTER — Encounter: Payer: Self-pay | Admitting: Physical Therapy

## 2022-01-13 ENCOUNTER — Ambulatory Visit: Payer: Medicare HMO | Admitting: Physical Therapy

## 2022-01-13 DIAGNOSIS — R262 Difficulty in walking, not elsewhere classified: Secondary | ICD-10-CM

## 2022-01-13 DIAGNOSIS — M6281 Muscle weakness (generalized): Secondary | ICD-10-CM

## 2022-01-13 DIAGNOSIS — M25551 Pain in right hip: Secondary | ICD-10-CM | POA: Diagnosis not present
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
## 2022-01-15 ENCOUNTER — Ambulatory Visit: Payer: Medicare HMO | Admitting: Physical Therapy

## 2022-01-15 ENCOUNTER — Encounter: Payer: Self-pay | Admitting: Physical Therapy

## 2022-01-15 DIAGNOSIS — R262 Difficulty in walking, not elsewhere classified: Secondary | ICD-10-CM | POA: Diagnosis not present

## 2022-01-15 DIAGNOSIS — M25551 Pain in right hip: Secondary | ICD-10-CM

## 2022-01-15 DIAGNOSIS — M6281 Muscle weakness (generalized): Secondary | ICD-10-CM
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
## 2022-01-20 ENCOUNTER — Ambulatory Visit: Payer: Medicare HMO | Admitting: Physical Therapy

## 2022-01-20 DIAGNOSIS — M6281 Muscle weakness (generalized): Secondary | ICD-10-CM | POA: Diagnosis not present

## 2022-01-20 DIAGNOSIS — M25551 Pain in right hip: Secondary | ICD-10-CM | POA: Diagnosis not present

## 2022-01-20 DIAGNOSIS — R262 Difficulty in walking, not elsewhere classified: Secondary | ICD-10-CM | POA: Diagnosis not present
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
## 2022-01-22 ENCOUNTER — Ambulatory Visit: Payer: Medicare HMO | Admitting: Physical Therapy

## 2022-01-22 ENCOUNTER — Encounter: Payer: Self-pay | Admitting: Physical Therapy

## 2022-01-22 DIAGNOSIS — M6281 Muscle weakness (generalized): Secondary | ICD-10-CM

## 2022-01-22 DIAGNOSIS — M25551 Pain in right hip: Secondary | ICD-10-CM | POA: Diagnosis not present

## 2022-01-22 DIAGNOSIS — R262 Difficulty in walking, not elsewhere classified: Secondary | ICD-10-CM | POA: Diagnosis not present
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
## 2022-01-26 DIAGNOSIS — M25551 Pain in right hip: Secondary | ICD-10-CM | POA: Diagnosis not present

## 2022-01-27 ENCOUNTER — Ambulatory Visit: Payer: Medicare HMO | Admitting: Physical Therapy

## 2022-01-27 ENCOUNTER — Encounter: Payer: Self-pay | Admitting: Physical Therapy

## 2022-01-27 DIAGNOSIS — M6281 Muscle weakness (generalized): Secondary | ICD-10-CM | POA: Diagnosis not present

## 2022-01-27 DIAGNOSIS — M25551 Pain in right hip: Secondary | ICD-10-CM

## 2022-01-27 DIAGNOSIS — R262 Difficulty in walking, not elsewhere classified: Secondary | ICD-10-CM
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
## 2022-01-27 NOTE — Patient Instructions (Signed)
[facility]  DRY NEEDLING CONSENT FORM   Trigger point dry needling is a physical therapy approach to treat Myofascial Pain and Dysfunction.  Dry Needling (DN) is a valuable and effective way to deactivate myofascial trigger points (muscle knots). It is skilled intervention that uses a thin filiform needle to penetrate the skin and stimulate underlying myofascial trigger points, muscular, and connective tissues for the management of neuromusculoskeletal pain and movement impairments.  A local twitch response (LTR) will be elicited.  This can sometimes feel like a deep ache in the muscle during the procedure. Multiple trigger points in multiple muscles can be treated during each treatment.  No medication of any kind is injected.   As with any medical treatment and procedure, there are possible adverse events.  While significant adverse events are uncommon, they do sometimes occur and must be considered prior to giving consent.  1. Dry needling often causes a "post needling soreness".  There can be an increase in pain from a couple of hours to 2-3 days, followed by an improvement in the overall pain state. 2. Any time a needle is used there is a risk of infection.  However, we are using new, sterile, and disposable needles; infections are extremely rare. 3. There is a possibility that you may bleed or bruise.  You may feel tired and some nausea following treatment. 4. There is a rare possibility of a pneumothorax (air in the chest cavity). 5. Allergic reaction to nickel in the stainless steel needle. 6. If a nerve is touched, it may cause paresthesia (a prickling/shock sensation) which is usually brief, but may continue for a couple of days.  Following treatment stay hydrated.  Continue regular activities but not too vigorous initially after treatment for 24-48 hours. You may apply heat to sore muscles.  Dry Needling is best when combined with other physical therapy  interventions such as strengthening, stretching and other therapeutic modalities.     PLEASE ANSWER THE FOLLOWING QUESTIONS:  Do you have a lack of sensation?   Y/N  Do you have a phobia or fear of needles  Y/N  Are you pregnant?    Y/N If yes:  How many weeks? _____  Do you have any implanted devices?  Y/N If yes:  Pacemaker/Spinal Cord         Stimulator/Deep Brain         Stimulator/Insulin          Pump/Other: ____________ Do you have any implants?   Y/N If yes:      Do you take any blood thinners?   Y/N If yes: Coumadin          (Warfarin)/Other:  Do you have a bleeding disorder?   Y/N If yes: What kind:   Do you take any immunosuppressants?  Y/N If yes:   What kind:   Do you take anti-inflammatories?   Y/N If yes: What kind:  Have you ever been diagnosed with Scoliosis? Y/N  Have you had back surgery?    Y/N If yes:         Laminectomy/Fusion/Other:   I have read, or had read to me, the above.  I have had the opportunity to ask any questions.  All of my questions have been answered to my satisfaction and I understand the risks involved with dry needling.  I consent to examination and treatment at [facility], including dry needling, of any and all of my involved and affected   muscles.   

## 2022-01-29 ENCOUNTER — Ambulatory Visit: Payer: Medicare HMO | Admitting: Physical Therapy

## 2022-01-29 DIAGNOSIS — M25551 Pain in right hip: Secondary | ICD-10-CM | POA: Diagnosis not present

## 2022-01-29 DIAGNOSIS — R262 Difficulty in walking, not elsewhere classified: Secondary | ICD-10-CM

## 2022-01-29 DIAGNOSIS — M6281 Muscle weakness (generalized): Secondary | ICD-10-CM | POA: Diagnosis not present
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
## 2022-02-02 ENCOUNTER — Emergency Department (HOSPITAL_COMMUNITY): Payer: Medicare HMO

## 2022-02-02 ENCOUNTER — Encounter (HOSPITAL_COMMUNITY): Payer: Self-pay | Admitting: Emergency Medicine

## 2022-02-02 ENCOUNTER — Other Ambulatory Visit: Payer: Self-pay

## 2022-02-02 ENCOUNTER — Inpatient Hospital Stay (HOSPITAL_COMMUNITY)
Admission: EM | Admit: 2022-02-02 | Discharge: 2022-02-06 | DRG: 378 | Disposition: A | Discharge status: Home / Self Care | Payer: Medicare HMO | Attending: Internal Medicine | Admitting: Internal Medicine

## 2022-02-02 DIAGNOSIS — Z79890 Hormone replacement therapy (postmenopausal): Secondary | ICD-10-CM

## 2022-02-02 DIAGNOSIS — E876 Hypokalemia: Secondary | ICD-10-CM | POA: Diagnosis present

## 2022-02-02 DIAGNOSIS — R0600 Dyspnea, unspecified: Secondary | ICD-10-CM

## 2022-02-02 DIAGNOSIS — K5731 Diverticulosis of large intestine without perforation or abscess with bleeding: Secondary | ICD-10-CM | POA: Diagnosis not present

## 2022-02-02 DIAGNOSIS — K760 Fatty (change of) liver, not elsewhere classified: Secondary | ICD-10-CM | POA: Diagnosis not present

## 2022-02-02 DIAGNOSIS — K625 Hemorrhage of anus and rectum: Secondary | ICD-10-CM | POA: Diagnosis not present

## 2022-02-02 DIAGNOSIS — Z880 Allergy status to penicillin: Secondary | ICD-10-CM

## 2022-02-02 DIAGNOSIS — D62 Acute posthemorrhagic anemia: Secondary | ICD-10-CM | POA: Diagnosis present

## 2022-02-02 DIAGNOSIS — F419 Anxiety disorder, unspecified: Secondary | ICD-10-CM | POA: Diagnosis present

## 2022-02-02 DIAGNOSIS — K219 Gastro-esophageal reflux disease without esophagitis: Secondary | ICD-10-CM | POA: Diagnosis present

## 2022-02-02 DIAGNOSIS — D649 Anemia, unspecified: Secondary | ICD-10-CM | POA: Diagnosis present

## 2022-02-02 DIAGNOSIS — I700 Atherosclerosis of aorta: Secondary | ICD-10-CM | POA: Diagnosis not present

## 2022-02-02 DIAGNOSIS — K5730 Diverticulosis of large intestine without perforation or abscess without bleeding: Secondary | ICD-10-CM | POA: Diagnosis not present

## 2022-02-02 DIAGNOSIS — Z96641 Presence of right artificial hip joint: Secondary | ICD-10-CM | POA: Diagnosis present

## 2022-02-02 DIAGNOSIS — E039 Hypothyroidism, unspecified: Secondary | ICD-10-CM | POA: Diagnosis present

## 2022-02-02 DIAGNOSIS — Z66 Do not resuscitate: Secondary | ICD-10-CM | POA: Diagnosis present

## 2022-02-02 DIAGNOSIS — E872 Acidosis, unspecified: Secondary | ICD-10-CM | POA: Diagnosis present

## 2022-02-02 DIAGNOSIS — Z8249 Family history of ischemic heart disease and other diseases of the circulatory system: Secondary | ICD-10-CM

## 2022-02-02 DIAGNOSIS — K6289 Other specified diseases of anus and rectum: Secondary | ICD-10-CM | POA: Diagnosis not present

## 2022-02-02 DIAGNOSIS — K640 First degree hemorrhoids: Secondary | ICD-10-CM | POA: Diagnosis present

## 2022-02-02 DIAGNOSIS — I2510 Atherosclerotic heart disease of native coronary artery without angina pectoris: Secondary | ICD-10-CM | POA: Diagnosis not present

## 2022-02-02 DIAGNOSIS — K635 Polyp of colon: Secondary | ICD-10-CM | POA: Diagnosis present

## 2022-02-02 DIAGNOSIS — Z833 Family history of diabetes mellitus: Secondary | ICD-10-CM

## 2022-02-02 DIAGNOSIS — M1990 Unspecified osteoarthritis, unspecified site: Secondary | ICD-10-CM

## 2022-02-02 DIAGNOSIS — R0602 Shortness of breath: Secondary | ICD-10-CM | POA: Diagnosis not present

## 2022-02-02 DIAGNOSIS — Z79899 Other long term (current) drug therapy: Secondary | ICD-10-CM

## 2022-02-02 LAB — CBC WITH DIFFERENTIAL/PLATELET
Abs Immature Granulocytes: 0.07 10*3/uL (ref 0.00–0.07)
Basophils Absolute: 0.1 10*3/uL (ref 0.0–0.1)
Basophils Relative: 0 %
Eosinophils Absolute: 0 10*3/uL (ref 0.0–0.5)
Eosinophils Relative: 0 %
HCT: 35.3 % — ABNORMAL LOW (ref 36.0–46.0)
Hemoglobin: 12.2 g/dL (ref 12.0–15.0)
Immature Granulocytes: 1 %
Lymphocytes Relative: 21 %
Lymphs Abs: 3 10*3/uL (ref 0.7–4.0)
MCH: 31 pg (ref 26.0–34.0)
MCHC: 34.6 g/dL (ref 30.0–36.0)
MCV: 89.8 fL (ref 80.0–100.0)
Monocytes Absolute: 0.6 10*3/uL (ref 0.1–1.0)
Monocytes Relative: 4 %
Neutro Abs: 10.3 10*3/uL — ABNORMAL HIGH (ref 1.7–7.7)
Neutrophils Relative %: 74 %
Platelets: 302 10*3/uL (ref 150–400)
RBC: 3.93 MIL/uL (ref 3.87–5.11)
RDW: 12.7 % (ref 11.5–15.5)
WBC: 13.9 10*3/uL — ABNORMAL HIGH (ref 4.0–10.5)
nRBC: 0 % (ref 0.0–0.2)

## 2022-02-02 LAB — CBC
HCT: 32.2 % — ABNORMAL LOW (ref 36.0–46.0)
Hemoglobin: 11.1 g/dL — ABNORMAL LOW (ref 12.0–15.0)
MCH: 31 pg (ref 26.0–34.0)
MCHC: 34.5 g/dL (ref 30.0–36.0)
MCV: 89.9 fL (ref 80.0–100.0)
Platelets: 227 10*3/uL (ref 150–400)
RBC: 3.58 MIL/uL — ABNORMAL LOW (ref 3.87–5.11)
RDW: 12.7 % (ref 11.5–15.5)
WBC: 10.4 10*3/uL (ref 4.0–10.5)
nRBC: 0 % (ref 0.0–0.2)

## 2022-02-02 LAB — BASIC METABOLIC PANEL
Anion gap: 12 (ref 5–15)
BUN: 15 mg/dL (ref 8–23)
CO2: 16 mmol/L — ABNORMAL LOW (ref 22–32)
Calcium: 8.9 mg/dL (ref 8.9–10.3)
Chloride: 109 mmol/L (ref 98–111)
Creatinine, Ser: 0.98 mg/dL (ref 0.44–1.00)
GFR, Estimated: >60 mL/min (ref >60)
Glucose, Bld: 196 mg/dL — ABNORMAL HIGH (ref 70–99)
Potassium: 3.4 mmol/L — ABNORMAL LOW (ref 3.5–5.1)
Sodium: 137 mmol/L (ref 135–145)

## 2022-02-02 LAB — BLOOD GAS, VENOUS
Acid-base deficit: 14 mmol/L — ABNORMAL HIGH (ref 0.0–2.0)
Bicarbonate: 12.9 mmol/L — ABNORMAL LOW (ref 20.0–28.0)
O2 Saturation: 99.7 %
Patient temperature: 37
pCO2, Ven: 33 mmHg — ABNORMAL LOW (ref 44–60)
pH, Ven: 7.2 — ABNORMAL LOW (ref 7.25–7.43)
pO2, Ven: 164 mmHg — ABNORMAL HIGH (ref 32–45)

## 2022-02-02 LAB — LACTIC ACID, PLASMA
Lactic Acid, Venous: 1.8 mmol/L (ref 0.5–1.9)
Lactic Acid, Venous: 2.1 mmol/L (ref 0.5–1.9)

## 2022-02-02 LAB — POC OCCULT BLOOD, ED: Fecal Occult Bld: POSITIVE — AB

## 2022-02-02 LAB — TYPE AND SCREEN
ABO/RH(D): A POS
Antibody Screen: NEGATIVE

## 2022-02-02 LAB — HEPATIC FUNCTION PANEL
ALT: 20 U/L (ref 0–44)
AST: 22 U/L (ref 15–41)
Albumin: 2.9 g/dL — ABNORMAL LOW (ref 3.5–5.0)
Alkaline Phosphatase: 43 U/L (ref 38–126)
Bilirubin, Direct: 0.1 mg/dL (ref 0.0–0.2)
Indirect Bilirubin: 0.2 mg/dL — ABNORMAL LOW (ref 0.3–0.9)
Total Bilirubin: 0.3 mg/dL (ref 0.3–1.2)
Total Protein: 5.1 g/dL — ABNORMAL LOW (ref 6.5–8.1)

## 2022-02-02 LAB — BRAIN NATRIURETIC PEPTIDE: B Natriuretic Peptide: 73.7 pg/mL (ref 0.0–100.0)

## 2022-02-02 LAB — SALICYLATE LEVEL: Salicylate Lvl: <7 mg/dL — ABNORMAL LOW (ref 7.0–30.0)

## 2022-02-02 LAB — TSH: TSH: 0.993 u[IU]/mL (ref 0.350–4.500)

## 2022-02-02 LAB — PHOSPHORUS: Phosphorus: 2.3 mg/dL — ABNORMAL LOW (ref 2.5–4.6)

## 2022-02-02 LAB — ETHANOL: Alcohol, Ethyl (B): <10 mg/dL (ref <10)

## 2022-02-02 LAB — TROPONIN I (HIGH SENSITIVITY)
Troponin I (High Sensitivity): 5 ng/L (ref <18)
Troponin I (High Sensitivity): 5 ng/L (ref <18)

## 2022-02-02 LAB — OSMOLALITY: Osmolality: 299 mOsm/kg — ABNORMAL HIGH (ref 275–295)

## 2022-02-02 LAB — MAGNESIUM: Magnesium: 2.7 mg/dL — ABNORMAL HIGH (ref 1.7–2.4)

## 2022-02-02 MED ORDER — ACETAMINOPHEN 325 MG PO TABS
650.0000 mg | ORAL_TABLET | Freq: Four times a day (QID) | ORAL | Status: DC | PRN
  Administered 2022-02-02 – 2022-02-05 (×4): 650 mg via ORAL
  Filled 2022-02-02 (×4): qty 2

## 2022-02-02 MED ORDER — ALPRAZOLAM 0.5 MG PO TABS
0.5000 mg | ORAL_TABLET | Freq: Two times a day (BID) | ORAL | Status: DC | PRN
  Administered 2022-02-02 – 2022-02-05 (×4): 0.5 mg via ORAL
  Filled 2022-02-02 (×4): qty 1

## 2022-02-02 MED ORDER — ACETAMINOPHEN 650 MG RE SUPP: 650.0000 mg | Freq: Four times a day (QID) | RECTAL | Status: DC | PRN

## 2022-02-02 MED ORDER — ONDANSETRON HCL 4 MG PO TABS: 4.0000 mg | ORAL_TABLET | Freq: Four times a day (QID) | ORAL | Status: DC | PRN

## 2022-02-02 MED ORDER — PANTOPRAZOLE SODIUM 40 MG PO TBEC
40.0000 mg | DELAYED_RELEASE_TABLET | Freq: Every day | ORAL | Status: DC
  Administered 2022-02-03 – 2022-02-06 (×4): 40 mg via ORAL
  Filled 2022-02-02 (×4): qty 1

## 2022-02-02 MED ORDER — MORPHINE SULFATE (PF) 4 MG/ML IV SOLN
4.0000 mg | Freq: Once | INTRAVENOUS | Status: AC
  Administered 2022-02-02: 4 mg via INTRAVENOUS
  Filled 2022-02-02: qty 1

## 2022-02-02 MED ORDER — LEVOTHYROXINE SODIUM 50 MCG PO TABS
50.0000 ug | ORAL_TABLET | Freq: Every day | ORAL | Status: DC
  Administered 2022-02-03 – 2022-02-06 (×3): 50 ug via ORAL
  Filled 2022-02-02 (×3): qty 1

## 2022-02-02 MED ORDER — ONDANSETRON HCL 4 MG/2ML IJ SOLN
4.0000 mg | Freq: Once | INTRAMUSCULAR | Status: AC
  Administered 2022-02-02: 4 mg via INTRAVENOUS
  Filled 2022-02-02: qty 2

## 2022-02-02 MED ORDER — ONDANSETRON HCL 4 MG/2ML IJ SOLN: 4.0000 mg | Freq: Four times a day (QID) | INTRAMUSCULAR | Status: DC | PRN

## 2022-02-02 MED ORDER — IOHEXOL 350 MG/ML SOLN
100.0000 mL | Freq: Once | INTRAVENOUS | Status: AC | PRN
  Administered 2022-02-02: 100 mL via INTRAVENOUS

## 2022-02-02 NOTE — ED Provider Notes (Signed)
?[facility] DEPT ?Provider Note ? ? ?CSN: [id number] ?Arrival date & time: 02/02/22  1328 ? ?  ? ?History ? ?Chief Complaint  ?Patient presents with  ? Rectal Bleeding  ? ? ?Sarah [name] is a 71 y.o. female. ? ?Sarah [name] is a 71 y.o. female with hx of fibromyalgia, migraines, stroke, pre-diabetes, who presents for evaluation of rectal bleeding. Pt reports 3 days of rectal bleeding and rectal pain. Pt reports passing increasing amounts of bright red blood as well as some clots. Pt also reports diarrhea.  She reports mild abdominal pain. Hx of bleeding polyp previously. Not on blood thinners. Pt reports this after she had sudden onset of dyspnea with some mild chest pain and palpitations. Felt light headed after passing blood just prior to arrival. ? ?The history is provided by the patient and a friend.  ?Rectal Bleeding ?Associated symptoms: abdominal pain and light-headedness   ?Associated symptoms: no fever   ? ?  ? ?Home Medications ?Prior to Admission medications   ?Medication Sig Start Date End Date Taking? Authorizing Provider  ?acetaminophen (TYLENOL) 650 MG CR tablet Take 1,300 mg by mouth every 8 (eight) hours.    [provider]  ?ALPRAZolam Duanne Moron) 0.5 MG tablet Take 0.5 mg by mouth 2 (two) times daily as needed. 03/15/20   [provider]  ?esomeprazole (NEXIUM) 20 MG capsule Take 20 mg by mouth daily at 12 noon.    [provider]  ?estradiol (ESTRACE) 1 MG tablet TAKE 1 TABLET BY MOUTH EVERY DAY ?Patient not taking: No sig reported 03/16/21   Florian Buff, MD  ?furosemide (LASIX) 40 MG tablet Take 40 mg by mouth daily. 02/17/21   [provider]  ?hydrochlorothiazide (HYDRODIURIL) 25 MG tablet Take 25 mg by mouth daily. ?Patient not taking: Reported on 04/14/2021    [provider]  ?lidocaine (LIDODERM) 5 % 1 patch daily as needed. ?Patient not taking: No sig reported 12/11/20   [provider]  ?loratadine  (CLARITIN) 10 MG tablet Take 10 mg by mouth daily.    [provider]  ?mirabegron ER (MYRBETRIQ) 50 MG TB24 tablet Take 1 tablet (50 mg total) by mouth daily. ?Patient not taking: No sig reported 02/20/21   Florian Buff, MD  ?traMADol (ULTRAM) 50 MG tablet Take 50 mg by mouth every 4 (four) hours as needed. 12/05/20   [provider]  ?   ? ?Allergies    ?Penicillins   ? ?Review of Systems   ?Review of Systems  ?Constitutional:  Positive for fatigue. Negative for chills and fever.  ?Respiratory:  Positive for shortness of breath.   ?Cardiovascular:  Positive for chest pain and palpitations.  ?Gastrointestinal:  Positive for abdominal pain, anal bleeding, blood in stool, diarrhea, hematochezia and rectal pain.  ?Neurological:  Positive for light-headedness. Negative for syncope.  ? ?Physical Exam ?Updated Vital Signs ?BP 106/76   Pulse (!) 128   Temp 97.6 ?F (36.4 ?C) (Oral)   Resp (!) 21   SpO2 100%  ?Physical Exam ?Vitals and nursing note reviewed.  ?Constitutional:   ?   General: She is not in acute distress. ?   Appearance: Normal appearance. She is well-developed. She is ill-appearing. She is not diaphoretic.  ?HENT:  ?   Head: Normocephalic and atraumatic.  ?Eyes:  ?   General:     ?   Right eye: No discharge.     ?   Left eye: No discharge.  ?  Pupils: Pupils are equal, round, and reactive to light.  ?Cardiovascular:  ?   Rate and Rhythm: Regular rhythm. Tachycardia present.  ?   Pulses: Normal pulses.  ?   Heart sounds: Normal heart sounds.  ?Pulmonary:  ?   Effort: No respiratory distress.  ?   Breath sounds: Normal breath sounds. No wheezing or rales.  ?   Comments: Pt is tachypneic with slight increase in work of breathing, still able to speak in full sentences and satting well on room air, lungs clear to auscultation bilaterally. ?Abdominal:  ?   General: Bowel sounds are normal. There is no distension.  ?   Palpations: Abdomen is soft. There is no mass.  ?   Tenderness: There is  abdominal tenderness. There is no guarding.  ?   Comments: Abdomen soft, nondistended, BS present, mild lower abdominal tenderness, otherwise NTTP. No guarding  ?Genitourinary: ?   Comments: Chaperone present for rectal exam. No external hemorrhoids or fissures noted, dark red blood present on exam with some tenderness during exam, possible palpable internal hemorrhoids, no masses, normal rectal tone ?Musculoskeletal:     ?   General: No deformity.  ?   Cervical back: Neck supple.  ?   Right lower leg: No edema.  ?   Left lower leg: No edema.  ?Skin: ?   General: Skin is warm and dry.  ?   Capillary Refill: Capillary refill takes less than 2 seconds.  ?Neurological:  ?   Mental Status: She is alert and oriented to person, place, and time.  ?   Coordination: Coordination normal.  ?   Comments: Speech is clear, able to follow commands ?CN III-XII intact ?Normal strength in upper and lower extremities bilaterally including dorsiflexion and plantar flexion, strong and equal grip strength ?Sensation normal to light and sharp touch ?Moves extremities without ataxia, coordination intact  ?Psychiatric:     ?   Mood and Affect: Mood normal.     ?   Behavior: Behavior normal.  ? ? ?ED Results / Procedures / Treatments   ?Labs ?(all labs ordered are listed, but only abnormal results are displayed) ?Labs Reviewed  ?CBC WITH DIFFERENTIAL/PLATELET - Abnormal; Notable for the following components:  ?    Result Value  ? WBC 13.9 (*)   ? HCT 35.3 (*)   ? Neutro Abs 10.3 (*)   ? All other components within normal limits  ?BASIC METABOLIC PANEL - Abnormal; Notable for the following components:  ? Potassium 3.4 (*)   ? CO2 16 (*)   ? Glucose, Bld 196 (*)   ? All other components within normal limits  ?BLOOD GAS, VENOUS - Abnormal; Notable for the following components:  ? pH, Ven 7.2 (*)   ? pCO2, Ven 33 (*)   ? pO2, Ven 164 (*)   ? Bicarbonate 12.9 (*)   ? Acid-base deficit 14.0 (*)   ? All other components within normal limits   ?MAGNESIUM - Abnormal; Notable for the following components:  ? Magnesium 2.7 (*)   ? All other components within normal limits  ?PHOSPHORUS - Abnormal; Notable for the following components:  ? Phosphorus 2.3 (*)   ? All other components within normal limits  ?LACTIC ACID, PLASMA - Abnormal; Notable for the following components:  ? Lactic Acid, Venous 2.1 (*)   ? All other components within normal limits  ?SALICYLATE LEVEL - Abnormal; Notable for the following components:  ? Salicylate Lvl <2.7 (*)   ? All  other components within normal limits  ?OSMOLALITY - Abnormal; Notable for the following components:  ? Osmolality 299 (*)   ? All other components within normal limits  ?HEPATIC FUNCTION PANEL - Abnormal; Notable for the following components:  ? Total Protein 5.1 (*)   ? Albumin 2.9 (*)   ? Indirect Bilirubin 0.2 (*)   ? All other components within normal limits  ?CBC - Abnormal; Notable for the following components:  ? RBC 3.58 (*)   ? Hemoglobin 11.1 (*)   ? HCT 32.2 (*)   ? All other components within normal limits  ?BASIC METABOLIC PANEL - Abnormal; Notable for the following components:  ? Potassium 3.4 (*)   ? CO2 21 (*)   ? Glucose, Bld 119 (*)   ? Calcium 8.0 (*)   ? All other components within normal limits  ?CBC - Abnormal; Notable for the following components:  ? WBC 11.7 (*)   ? RBC 3.27 (*)   ? Hemoglobin 10.2 (*)   ? HCT 30.1 (*)   ? All other components within normal limits  ?HEMOGLOBIN A1C - Abnormal; Notable for the following components:  ? Hgb A1c MFr Bld 6.3 (*)   ? All other components within normal limits  ?CBC - Abnormal; Notable for the following components:  ? RBC 2.77 (*)   ? Hemoglobin 8.5 (*)   ? HCT 26.1 (*)   ? All other components within normal limits  ?BASIC METABOLIC PANEL - Abnormal; Notable for the following components:  ? Chloride 116 (*)   ? Glucose, Bld 109 (*)   ? Calcium 8.2 (*)   ? Anion gap 3 (*)   ? All other components within normal limits  ?COMPREHENSIVE METABOLIC PANEL  - Abnormal; Notable for the following components:  ? Chloride 113 (*)   ? CO2 21 (*)   ? Glucose, Bld 103 (*)   ? Calcium 8.3 (*)   ? Total Protein 5.2 (*)   ? Albumin 3.0 (*)   ? All other components within normal limits
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
## 2022-02-02 NOTE — ED Provider Triage Note (Signed)
Emergency Medicine Provider Triage Evaluation Note ? ?Sarah [name] , a 71 y.o. female  was evaluated in triage.  Pt complains of rectal bleeding and rectal pain.  Patient reports she has been passing bright red blood per rectum since Saturday, reports it is painful and she has been having some diarrhea with this.  Reports she has a history of a polyp bleed previously with similar symptoms.  Reports some very mild lower abdominal pain.  Reports she has passed up to a half a cup to a cup of blood at one time at home.  Is not on blood thinners.  Reports some associated chest pain, shortness of breath and lightheadedness but has not passed out. ? ?Review of Systems  ?Positive: Rectal bleeding, rectal pain, chest pain, shortness of breath, lightheadedness ?Negative: Vomiting, hematemesis ? ?Physical Exam  ?BP 115/83 (BP Location: Right Arm)   Pulse (!) 130   Temp 97.7 ?F (36.5 ?C) (Oral)   Resp (!) 24   SpO2 95%  ?Gen:   Awake, ill-appearing and appears uncomfortable ?Resp:  Patient is tachypneic with some increased respiratory effort but maintaining sats of 95% ?Cardiac: Patient tachycardic to the 130s with regular rhythm ?MSK:   Moves extremities without difficulty  ?Other:  Minimal lower abdominal tenderness ? ?Medical Decision Making  ?Medically screening exam initiated at 1:45 PM.  Appropriate orders placed.  CHRIS NARASIMHAN was informed that the remainder of the evaluation will be completed by another provider, this initial triage assessment does not replace that evaluation, and the importance of remaining in the ED until their evaluation is complete. ? ?Patient will need acute bed for further evaluation, has had rectal bleeding since Saturday, tachycardic here with some shortness of breath and chest discomfort as well as lightheadedness, concern for acute blood loss anemia ?  ?Jacqlyn Larsen, PA-C ?02/02/22 1355 ? ?

## 2022-02-02 NOTE — ED Notes (Signed)
Pt expressed exertional shortness of breath. ?

## 2022-02-02 NOTE — ED Triage Notes (Signed)
Patient reports rectal bleeding x3 days. States worsening this morning with large amounts of blood from rectum. Tachycardic and labored breathing in triage. ?

## 2022-02-03 ENCOUNTER — Ambulatory Visit: Payer: Medicaid Other | Admitting: Physical Therapy

## 2022-02-03 DIAGNOSIS — K625 Hemorrhage of anus and rectum: Secondary | ICD-10-CM | POA: Diagnosis not present

## 2022-02-03 DIAGNOSIS — D638 Anemia in other chronic diseases classified elsewhere: Secondary | ICD-10-CM | POA: Diagnosis not present

## 2022-02-03 DIAGNOSIS — E876 Hypokalemia: Secondary | ICD-10-CM | POA: Diagnosis not present

## 2022-02-03 DIAGNOSIS — E039 Hypothyroidism, unspecified: Secondary | ICD-10-CM | POA: Diagnosis not present

## 2022-02-03 DIAGNOSIS — K640 First degree hemorrhoids: Secondary | ICD-10-CM | POA: Diagnosis not present

## 2022-02-03 DIAGNOSIS — K922 Gastrointestinal hemorrhage, unspecified: Secondary | ICD-10-CM | POA: Diagnosis not present

## 2022-02-03 DIAGNOSIS — Z96641 Presence of right artificial hip joint: Secondary | ICD-10-CM | POA: Diagnosis not present

## 2022-02-03 DIAGNOSIS — E872 Acidosis, unspecified: Secondary | ICD-10-CM | POA: Diagnosis not present

## 2022-02-03 DIAGNOSIS — K5731 Diverticulosis of large intestine without perforation or abscess with bleeding: Secondary | ICD-10-CM | POA: Diagnosis not present

## 2022-02-03 DIAGNOSIS — D649 Anemia, unspecified: Secondary | ICD-10-CM | POA: Diagnosis not present

## 2022-02-03 DIAGNOSIS — Z79890 Hormone replacement therapy (postmenopausal): Secondary | ICD-10-CM | POA: Diagnosis not present

## 2022-02-03 DIAGNOSIS — Z833 Family history of diabetes mellitus: Secondary | ICD-10-CM | POA: Diagnosis not present

## 2022-02-03 DIAGNOSIS — K219 Gastro-esophageal reflux disease without esophagitis: Secondary | ICD-10-CM | POA: Diagnosis not present

## 2022-02-03 DIAGNOSIS — K635 Polyp of colon: Secondary | ICD-10-CM | POA: Diagnosis not present

## 2022-02-03 DIAGNOSIS — Z79899 Other long term (current) drug therapy: Secondary | ICD-10-CM | POA: Diagnosis not present

## 2022-02-03 DIAGNOSIS — Z8249 Family history of ischemic heart disease and other diseases of the circulatory system: Secondary | ICD-10-CM | POA: Diagnosis not present

## 2022-02-03 DIAGNOSIS — Z880 Allergy status to penicillin: Secondary | ICD-10-CM | POA: Diagnosis not present

## 2022-02-03 DIAGNOSIS — D62 Acute posthemorrhagic anemia: Secondary | ICD-10-CM | POA: Diagnosis not present

## 2022-02-03 DIAGNOSIS — D125 Benign neoplasm of sigmoid colon: Secondary | ICD-10-CM | POA: Diagnosis not present

## 2022-02-03 DIAGNOSIS — F419 Anxiety disorder, unspecified: Secondary | ICD-10-CM | POA: Diagnosis present

## 2022-02-03 DIAGNOSIS — R69 Illness, unspecified: Secondary | ICD-10-CM | POA: Diagnosis not present

## 2022-02-03 DIAGNOSIS — K5730 Diverticulosis of large intestine without perforation or abscess without bleeding: Secondary | ICD-10-CM | POA: Diagnosis not present

## 2022-02-03 DIAGNOSIS — D124 Benign neoplasm of descending colon: Secondary | ICD-10-CM | POA: Diagnosis not present

## 2022-02-03 DIAGNOSIS — K921 Melena: Secondary | ICD-10-CM | POA: Diagnosis not present

## 2022-02-03 DIAGNOSIS — Z66 Do not resuscitate: Secondary | ICD-10-CM | POA: Diagnosis not present

## 2022-02-03 LAB — BASIC METABOLIC PANEL
Anion gap: 7 (ref 5–15)
BUN: 22 mg/dL (ref 8–23)
CO2: 21 mmol/L — ABNORMAL LOW (ref 22–32)
Calcium: 8 mg/dL — ABNORMAL LOW (ref 8.9–10.3)
Chloride: 110 mmol/L (ref 98–111)
Creatinine, Ser: 0.67 mg/dL (ref 0.44–1.00)
GFR, Estimated: >60 mL/min (ref >60)
Glucose, Bld: 119 mg/dL — ABNORMAL HIGH (ref 70–99)
Potassium: 3.4 mmol/L — ABNORMAL LOW (ref 3.5–5.1)
Sodium: 138 mmol/L (ref 135–145)

## 2022-02-03 LAB — CBC
HCT: 30.1 % — ABNORMAL LOW (ref 36.0–46.0)
Hemoglobin: 10.2 g/dL — ABNORMAL LOW (ref 12.0–15.0)
MCH: 31.2 pg (ref 26.0–34.0)
MCHC: 33.9 g/dL (ref 30.0–36.0)
MCV: 92 fL (ref 80.0–100.0)
Platelets: 197 10*3/uL (ref 150–400)
RBC: 3.27 MIL/uL — ABNORMAL LOW (ref 3.87–5.11)
RDW: 13.2 % (ref 11.5–15.5)
WBC: 11.7 10*3/uL — ABNORMAL HIGH (ref 4.0–10.5)
nRBC: 0 % (ref 0.0–0.2)

## 2022-02-03 LAB — PROTIME-INR
INR: 1 (ref 0.8–1.2)
Prothrombin Time: 13.6 seconds (ref 11.4–15.2)

## 2022-02-03 LAB — HEMOGLOBIN A1C
Hgb A1c MFr Bld: 6.3 % — ABNORMAL HIGH (ref 4.8–5.6)
Mean Plasma Glucose: 134.11 mg/dL

## 2022-02-03 LAB — LACTIC ACID, PLASMA: Lactic Acid, Venous: 1.6 mmol/L (ref 0.5–1.9)

## 2022-02-03 LAB — MAGNESIUM: Magnesium: 2.1 mg/dL (ref 1.7–2.4)

## 2022-02-03 LAB — HIV ANTIBODY (ROUTINE TESTING W REFLEX): HIV Screen 4th Generation wRfx: NONREACTIVE

## 2022-02-03 MED ORDER — DIPHENHYDRAMINE HCL 50 MG/ML IJ SOLN
12.5000 mg | Freq: Once | INTRAMUSCULAR | Status: AC
  Administered 2022-02-03: 12.5 mg via INTRAVENOUS
  Filled 2022-02-03: qty 1

## 2022-02-03 MED ORDER — DIPHENHYDRAMINE HCL 25 MG PO CAPS
25.0000 mg | ORAL_CAPSULE | Freq: Once | ORAL | Status: AC
  Administered 2022-02-03: 25 mg via ORAL
  Filled 2022-02-03: qty 1

## 2022-02-03 MED ORDER — POTASSIUM CHLORIDE 10 MEQ/100ML IV SOLN
10.0000 meq | INTRAVENOUS | Status: AC
  Administered 2022-02-03 (×4): 10 meq via INTRAVENOUS
  Filled 2022-02-03 (×4): qty 100

## 2022-02-03 MED ORDER — DIPHENHYDRAMINE HCL 50 MG/ML IJ SOLN
25.0000 mg | Freq: Three times a day (TID) | INTRAMUSCULAR | Status: DC | PRN
  Administered 2022-02-03 – 2022-02-05 (×2): 25 mg via INTRAVENOUS
  Filled 2022-02-03 (×2): qty 1

## 2022-02-03 MED ORDER — MORPHINE SULFATE (PF) 2 MG/ML IV SOLN
1.0000 mg | Freq: Once | INTRAVENOUS | Status: AC | PRN
  Administered 2022-02-03: 1 mg via INTRAVENOUS
  Filled 2022-02-03: qty 1

## 2022-02-03 MED ORDER — SODIUM CHLORIDE 0.9 % IV SOLN: INTRAVENOUS | Status: DC

## 2022-02-03 NOTE — Hospital Course (Addendum)
Sarah [name] is a 71 y.o. female with medical history significant for hypothyroidism, osteoarthritis s/p right THA, anxiety, diverticulosis with history of lower GI bleed, GERD, hiatal hernia s/p Nissen fundoplication who is admitted with rectal bleeding. ? ?Patient reports 3 days of rectal bleeding, worsening this morning (5/1).  She has noted large amount of blood from rectum.  She has had intermittent diarrhea as well.  She noticed dark clots during bowel movement while in the ED.  She has felt somewhat short of breath.  She is feeling anxious.  She reports history of lower GI bleeding about 10 years ago at which time she had several polyps resected on colonoscopy. ?  ?She denies any use of blood thinners.  She denies any chest pain, nausea, vomiting, cough, abdominal pain, dysuria. ?  ?5/2: Hemoglobin with little downward trend, and at 10.2 this morning.  Last bloody bowel movement was 8 AM.  GI was consulted-pending. ?After discussing with GI she was started on clear liquid diet and then they will determine the need for EGD and or colonoscopy. ?Initially had mild lactic acidosis which has been resolved. ?Mild hypokalemia which is being repleted. ?Rest of the labs were unremarkable. ?Chest x-ray without any acute abnormality. ?CTA chest/abdomen/pelvis negative for evidence of PE or active/acute cardiopulmonary process.  Moderate size hiatal hernia with surgical sutures within the adjacent portion of stomach noted.  Noninflamed diverticulosis seen.  Mild coronary artery calcification also seen. ?Patient also has an history of hemorrhoids. ?

## 2022-02-03 NOTE — Assessment & Plan Note (Signed)
Fairly anxious on admission which was likely cause of her tachypnea and tachycardia.  Significant work-up for pulmonary process negative. ?-Continue home Xanax 0.5 mg twice daily as needed ?

## 2022-02-03 NOTE — Progress Notes (Signed)
?Progress Note ? ? ?Patient: Sarah [name] ZOX:[id number] DOB: [birth year] DOA: 02/02/2022     0 ?DOS: the patient was seen and examined on 02/03/2022 ?  ?Brief hospital course: ?Sarah [name] is a 71 y.o. female with medical history significant for hypothyroidism, osteoarthritis s/p right THA, anxiety, diverticulosis with history of lower GI bleed, GERD, hiatal hernia s/p Nissen fundoplication who is admitted with rectal bleeding. ? ?Patient reports 3 days of rectal bleeding, worsening this morning (5/1).  She has noted large amount of blood from rectum.  She has had intermittent diarrhea as well.  She noticed dark clots during bowel movement while in the ED.  She has felt somewhat short of breath.  She is feeling anxious.  She reports history of lower GI bleeding about 10 years ago at which time she had several polyps resected on colonoscopy. ?  ?She denies any use of blood thinners.  She denies any chest pain, nausea, vomiting, cough, abdominal pain, dysuria. ?  ?5/2: Hemoglobin with little downward trend, and at 10.2 this morning.  Last bloody bowel movement was 8 AM.  GI was consulted-pending. ?After discussing with GI she was started on clear liquid diet and then they will determine the need for EGD and or colonoscopy. ?Initially had mild lactic acidosis which has been resolved. ?Mild hypokalemia which is being repleted. ?Rest of the labs were unremarkable. ?Chest x-ray without any acute abnormality. ?CTA chest/abdomen/pelvis negative for evidence of PE or active/acute cardiopulmonary process.  Moderate size hiatal hernia with surgical sutures within the adjacent portion of stomach noted.  Noninflamed diverticulosis seen.  Mild coronary artery calcification also seen. ?Patient also has an history of hemorrhoids. ? ? ?Assessment and Plan: ?* BRBPR (bright red blood per rectum) ?Patient with 3 days of bright red rectal bleeding.  History of prior diverticular bleed.  Hemoglobin 12.2 > 11.1>>10.6 on  repeat. ?-Repeat CBC in a.m., transfuse PRBC as needed ?-Starting clear liquid after discussing with GI. ?-Follow-up GI recommendations-consult pending ? ?Hypothyroidism ?-Continue Synthroid. ? ?Anxiety ?Fairly anxious on admission which was likely cause of her tachypnea and tachycardia.  Significant work-up for pulmonary process negative. ?-Continue home Xanax 0.5 mg twice daily as needed ? ? ?Subjective: Patient was seen and examined today.  Denies any pain.  Has an history of hemorrhoid.  Last bloody bowel movement was around 8 AM this morning.  No nausea or vomiting. ? ?Physical Exam: ?Vitals:  ? 02/03/22 0116 02/03/22 0525 02/03/22 0904 02/03/22 1255  ?BP: 101/65 105/63 121/66 129/72  ?Pulse: 84 84 79 100  ?Resp: '19 20 15 15  '$ ?Temp: 97.9 ?F (36.6 ?C) 98 ?F (36.7 ?C) 97.9 ?F (36.6 ?C) 98.4 ?F (36.9 ?C)  ?TempSrc: Oral Oral Oral Oral  ?SpO2: 98% 98% 100% 99%  ?Weight:      ?Height:      ? ?General.  Well-developed lady, in no acute distress. ?Pulmonary.  Lungs clear bilaterally, normal respiratory effort. ?CV.  Regular rate and rhythm, no JVD, rub or murmur. ?Abdomen.  Soft, nontender, nondistended, BS positive. ?CNS.  Alert and oriented .  No focal neurologic deficit. ?Extremities.  No edema, no cyanosis, pulses intact and symmetrical. ?Psychiatry.  Judgment and insight appears normal. ? ?Data Reviewed: ?Prior notes, labs and images reviewed ? ?Family Communication: Discussed with patient ? ?Disposition: ?Status is: Inpatient ?Remains inpatient appropriate because: Severity of illness ? ? Planned Discharge Destination: Home ? ?DVT prophylaxis.  SCDs-GI bleed ?Time spent: 45 minutes ? ?This record has been created using [employer]  voice recognition software. Errors have been sought and corrected,but may not always be located. Such creation errors do not reflect on the standard of care. ? ?Author: ?Sarah Nimrod, MD ?02/03/2022 3:02 PM ? ?For on call review [email or web address].  ?

## 2022-02-03 NOTE — Progress Notes (Signed)
Transition of Care (TOC) Screening Note ? ?Patient Details  ?Name: Sarah [name] ?Date of Birth: Feb 20, 1951 ? ?Transition of Care (TOC) CM/SW Contact:    ?Sherie Don, LCSW ?Phone Number: ?02/03/2022, 10:41 AM ? ?Transition of Care Department [facility]) has reviewed patient and no TOC needs have been identified at this time. We will continue to monitor patient advancement through interdisciplinary progression rounds. If new patient transition needs arise, please place a TOC consult. ?

## 2022-02-03 NOTE — Assessment & Plan Note (Addendum)
Continue Synthroid °
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
## 2022-02-03 NOTE — Assessment & Plan Note (Addendum)
Patient with 3 days of bright red rectal bleeding.  History of prior diverticular bleed.  Hemoglobin 12.2 > 11.1>>10.6 on repeat. ?-Repeat CBC in a.m., transfuse PRBC as needed ?-Starting clear liquid after discussing with GI. ?-Follow-up GI recommendations-consult pending ?

## 2022-02-04 DIAGNOSIS — M1990 Unspecified osteoarthritis, unspecified site: Secondary | ICD-10-CM

## 2022-02-04 DIAGNOSIS — K625 Hemorrhage of anus and rectum: Secondary | ICD-10-CM | POA: Diagnosis not present

## 2022-02-04 DIAGNOSIS — F419 Anxiety disorder, unspecified: Secondary | ICD-10-CM | POA: Diagnosis not present

## 2022-02-04 LAB — CBC
HCT: 26.1 % — ABNORMAL LOW (ref 36.0–46.0)
Hemoglobin: 8.5 g/dL — ABNORMAL LOW (ref 12.0–15.0)
MCH: 30.7 pg (ref 26.0–34.0)
MCHC: 32.6 g/dL (ref 30.0–36.0)
MCV: 94.2 fL (ref 80.0–100.0)
Platelets: 164 10*3/uL (ref 150–400)
RBC: 2.77 MIL/uL — ABNORMAL LOW (ref 3.87–5.11)
RDW: 13.2 % (ref 11.5–15.5)
WBC: 6 10*3/uL (ref 4.0–10.5)
nRBC: 0 % (ref 0.0–0.2)

## 2022-02-04 LAB — BASIC METABOLIC PANEL
Anion gap: 3 — ABNORMAL LOW (ref 5–15)
BUN: 14 mg/dL (ref 8–23)
CO2: 23 mmol/L (ref 22–32)
Calcium: 8.2 mg/dL — ABNORMAL LOW (ref 8.9–10.3)
Chloride: 116 mmol/L — ABNORMAL HIGH (ref 98–111)
Creatinine, Ser: 0.7 mg/dL (ref 0.44–1.00)
GFR, Estimated: >60 mL/min (ref >60)
Glucose, Bld: 109 mg/dL — ABNORMAL HIGH (ref 70–99)
Potassium: 3.7 mmol/L (ref 3.5–5.1)
Sodium: 142 mmol/L (ref 135–145)

## 2022-02-04 MED ORDER — OXYCODONE HCL 5 MG PO TABS
5.0000 mg | ORAL_TABLET | Freq: Four times a day (QID) | ORAL | Status: DC | PRN
  Administered 2022-02-04 – 2022-02-06 (×6): 5 mg via ORAL
  Filled 2022-02-04 (×6): qty 1

## 2022-02-04 MED ORDER — PEG 3350-KCL-NA BICARB-NACL 420 G PO SOLR
4000.0000 mL | Freq: Once | ORAL | Status: AC
  Administered 2022-02-04: 4000 mL via ORAL

## 2022-02-04 NOTE — H&P (View-Only) (Signed)
[facility] Progress Note ? ?BUFFY EHLER 71 y.o. 03-Dec-1950 ? ?CC:  Lower GI bleed ? ? ?Subjective: ?Patient seen and examined sitting in chair comfortably.  Denies abdominal pain.  She continues to have poor bloody bowel movements.  Notes blood is dark red with some bright red.  Notes some mild lower abdominal pain. ? ?ROS : Review of Systems  ?Gastrointestinal:  Positive for abdominal pain and blood in stool. Negative for constipation, diarrhea, heartburn, melena, nausea and vomiting.  ?Genitourinary:  Negative for dysuria and urgency.  ?Musculoskeletal:  Negative for myalgias and neck pain.   ? ? ?Objective: ?Vital signs in last 24 hours: ?Vitals:  ? 02/04/22 0536 02/04/22 0908  ?BP: (!) 110/52 127/69  ?Pulse: 83 (!) 107  ?Resp: 18 18  ?Temp: 98 ?F (36.7 ?C) 97.6 ?F (36.4 ?C)  ?SpO2: 96% 99%  ? ? ?Physical Exam: ? ?General:  Alert, cooperative, no distress, appears stated age, pale  ?Head:  Normocephalic, without obvious abnormality, atraumatic  ?Eyes:  Anicteric sclera, EOM's intact, conjunctival pallor  ?Lungs:   Clear to auscultation bilaterally, respirations unlabored  ?Heart:  Regular rate and rhythm, S1, S2 normal  ?Abdomen:   Soft, non-tender, bowel sounds active all four quadrants,  no masses,   ?Extremities: Extremities normal, atraumatic, no  edema  ?Pulses: 2+ and symmetric  ? ? ?Lab Results: ?Recent Labs  ?  02/02/22 ?1549 02/03/22 ?4967 02/03/22 ?5916 02/04/22 ?0532  ?NA  --  138  --  142  ?K  --  3.4*  --  3.7  ?CL  --  110  --  116*  ?CO2  --  21*  --  23  ?GLUCOSE  --  119*  --  109*  ?BUN  --  22  --  14  ?CREATININE  --  0.67  --  0.70  ?CALCIUM  --  8.0*  --  8.2*  ?MG 2.7*  --  2.1  --   ?PHOS 2.3*  --   --   --   ? ?Recent Labs  ?  02/02/22 ?1549  ?AST 22  ?ALT 20  ?ALKPHOS 43  ?BILITOT 0.3  ?PROT 5.1*  ?ALBUMIN 2.9*  ? ?Recent Labs  ?  02/02/22 ?1404 02/02/22 ?1906 02/03/22 ?3846 02/04/22 ?0532  ?WBC 13.9*   < > 11.7* 6.0  ?NEUTROABS 10.3*  --   --   --   ?HGB 12.2   < > 10.2*  8.5*  ?HCT 35.3*   < > 30.1* 26.1*  ?MCV 89.8   < > 92.0 94.2  ?PLT 302   < > 197 164  ? < > = values in this interval not displayed.  ? ?Recent Labs  ?  02/03/22 ?6599  ?LABPROT 13.6  ?INR 1.0  ? ? ? ? ?Assessment ?Anemia ?Hematochezia ? ?Anemia worsening to 8.5 today. Continues to have bloody bowel movements.  Likely diverticular bleeding.  With worsening hemoglobin and no swallowing and bloody stools we will proceed with colonoscopy to try and identify point of bleeding. ? ? ?Plan: ?Plan for Colonoscopy tomorrow. I thoroughly discussed the procedures to include nature, alternatives, benefits, and risks including but not limited to bleeding, perforation, infection, anesthesia/cardiac and pulmonary complications. Patient provides understanding and gave verbal consent to proceed. ?Continue Protonix 40 mg daily. ?Nulytely prep, clear liquid diet, NPO at midnight. ?Continue daily CBC with transfusion as needed to maintain Hgb >7.  ?[facility] will follow.    ? ?Charlott Rakes PA-C ?02/04/2022, 12:52  PM ? ?Contact #  [phone]  ?

## 2022-02-04 NOTE — Assessment & Plan Note (Addendum)
-  Pt reports long hx of arthritis, little improvement with ultram or large amounts of tylenol prior to admit ?-Started PRN oxycodone while in hospital with good results ?

## 2022-02-04 NOTE — Progress Notes (Signed)
[facility] Progress Note ? ?Sarah [name] 71 y.o. [birth year] ? ?CC:  Lower GI bleed ? ? ?Subjective: ?Patient seen and examined sitting in chair comfortably.  Denies abdominal pain.  She continues to have poor bloody bowel movements.  Notes blood is dark red with some bright red.  Notes some mild lower abdominal pain. ? ?ROS : Review of Systems  ?Gastrointestinal:  Positive for abdominal pain and blood in stool. Negative for constipation, diarrhea, heartburn, melena, nausea and vomiting.  ?Genitourinary:  Negative for dysuria and urgency.  ?Musculoskeletal:  Negative for myalgias and neck pain.   ? ? ?Objective: ?Vital signs in last 24 hours: ?Vitals:  ? 02/04/22 0536 02/04/22 0908  ?BP: (!) 110/52 127/69  ?Pulse: 83 (!) 107  ?Resp: 18 18  ?Temp: 98 ?F (36.7 ?C) 97.6 ?F (36.4 ?C)  ?SpO2: 96% 99%  ? ? ?Physical Exam: ? ?General:  Alert, cooperative, no distress, appears stated age, pale  ?Head:  Normocephalic, without obvious abnormality, atraumatic  ?Eyes:  Anicteric sclera, EOM's intact, conjunctival pallor  ?Lungs:   Clear to auscultation bilaterally, respirations unlabored  ?Heart:  Regular rate and rhythm, S1, S2 normal  ?Abdomen:   Soft, non-tender, bowel sounds active all four quadrants,  no masses,   ?Extremities: Extremities normal, atraumatic, no  edema  ?Pulses: 2+ and symmetric  ? ? ?Lab Results: ?Recent Labs  ?  02/02/22 ?1549 02/03/22 ?0263 02/03/22 ?7858 02/04/22 ?0532  ?NA  --  138  --  142  ?K  --  3.4*  --  3.7  ?CL  --  110  --  116*  ?CO2  --  21*  --  23  ?GLUCOSE  --  119*  --  109*  ?BUN  --  22  --  14  ?CREATININE  --  0.67  --  0.70  ?CALCIUM  --  8.0*  --  8.2*  ?MG 2.7*  --  2.1  --   ?PHOS 2.3*  --   --   --   ? ?Recent Labs  ?  02/02/22 ?1549  ?AST 22  ?ALT 20  ?ALKPHOS 43  ?BILITOT 0.3  ?PROT 5.1*  ?ALBUMIN 2.9*  ? ?Recent Labs  ?  02/02/22 ?1404 02/02/22 ?1906 02/03/22 ?8502 02/04/22 ?0532  ?WBC 13.9*   < > 11.7* 6.0  ?NEUTROABS 10.3*  --   --   --   ?HGB 12.2   < > 10.2*  8.5*  ?HCT 35.3*   < > 30.1* 26.1*  ?MCV 89.8   < > 92.0 94.2  ?PLT 302   < > 197 164  ? < > = values in this interval not displayed.  ? ?Recent Labs  ?  02/03/22 ?7741  ?LABPROT 13.6  ?INR 1.0  ? ? ? ? ?Assessment ?Anemia ?Hematochezia ? ?Anemia worsening to 8.5 today. Continues to have bloody bowel movements.  Likely diverticular bleeding.  With worsening hemoglobin and no swallowing and bloody stools we will proceed with colonoscopy to try and identify point of bleeding. ? ? ?Plan: ?Plan for Colonoscopy tomorrow. I thoroughly discussed the procedures to include nature, alternatives, benefits, and risks including but not limited to bleeding, perforation, infection, anesthesia/cardiac and pulmonary complications. Patient provides understanding and gave verbal consent to proceed. ?Continue Protonix 40 mg daily. ?Nulytely prep, clear liquid diet, NPO at midnight. ?Continue daily CBC with transfusion as needed to maintain Hgb >7.  ?[facility] will follow.    ? ?Charlott Rakes PA-C ?02/04/2022, 12:52  PM ? ?Contact #  [phone]  ?

## 2022-02-04 NOTE — Progress Notes (Signed)
?Progress Note ? ? ?Patient: Sarah [name]:[id number] DOB: May 10, 1951 DOA: 02/02/2022     1 ?DOS: the patient was seen and examined on 02/04/2022 ?  ?Brief hospital course: ?Sarah [name] is a 71 y.o. female with medical history significant for hypothyroidism, osteoarthritis s/p right THA, anxiety, diverticulosis with history of lower GI bleed, GERD, hiatal hernia s/p Nissen fundoplication who is admitted with rectal bleeding. ? ?Patient reports 3 days of rectal bleeding, worsening this morning (5/1).  She has noted large amount of blood from rectum.  She has had intermittent diarrhea as well.  She noticed dark clots during bowel movement while in the ED.  She has felt somewhat short of breath.  She is feeling anxious.  She reports history of lower GI bleeding about 10 years ago at which time she had several polyps resected on colonoscopy. ?  ?She denies any use of blood thinners.  She denies any chest pain, nausea, vomiting, cough, abdominal pain, dysuria. ?  ?5/2: Hemoglobin with little downward trend, and at 10.2 this morning.  Last bloody bowel movement was 8 AM.  GI was consulted-pending. ?After discussing with GI she was started on clear liquid diet and then they will determine the need for EGD and or colonoscopy. ?Initially had mild lactic acidosis which has been resolved. ?Mild hypokalemia which is being repleted. ?Rest of the labs were unremarkable. ?Chest x-ray without any acute abnormality. ?CTA chest/abdomen/pelvis negative for evidence of PE or active/acute cardiopulmonary process.  Moderate size hiatal hernia with surgical sutures within the adjacent portion of stomach noted.  Noninflamed diverticulosis seen.  Mild coronary artery calcification also seen. ?Patient also has an history of hemorrhoids. ? ?Assessment and Plan: ?* BRBPR (bright red blood per rectum) ?Patient with 3 days of bright red rectal bleeding.  History of prior diverticular bleed.  Hemoglobin 12.2 to 8.5 this AM with continued  bleed ?-currently on clears ?-GI recs noted. Plan for colonoscopy tomorrow ? ?Hypothyroidism ?-Continue Synthroid. ? ?Anxiety ?Fairly anxious on admission which was likely cause of her tachypnea and tachycardia.  Significant work-up for pulmonary process negative. ?-Continue home Xanax 0.5 mg twice daily as needed ? ?Osteoarthritis ?-Pt reports long hx of arthritis, little improvement with ultram or large amounts of tylenol prior to admit ?-Will give trial of PRN oxycodone ? ? ? ? ?  ? ?Subjective: Complains of continued joint pains. Still passing blood per rectum this AM ? ?Physical Exam: ?Vitals:  ? 02/03/22 2121 02/04/22 0536 02/04/22 0908 02/04/22 1311  ?BP: (!) 100/53 (!) 110/52 127/69 126/73  ?Pulse: 92 83 (!) 107 96  ?Resp: '20 18 18 15  '$ ?Temp: 98.6 ?F (37 ?C) 98 ?F (36.7 ?C) 97.6 ?F (36.4 ?C) 98.5 ?F (36.9 ?C)  ?TempSrc: Oral Oral Oral Oral  ?SpO2: 95% 96% 99% 100%  ?Weight:      ?Height:      ? ?General exam: Awake, laying in bed, in nad ?Respiratory system: Normal respiratory effort, no wheezing ?Cardiovascular system: regular rate, s1, s2 ?Gastrointestinal system: Soft, nondistended, positive BS ?Central nervous system: CN2-12 grossly intact, strength intact ?Extremities: Perfused, no clubbing ?Skin: Normal skin turgor, no notable skin lesions seen ?Psychiatry: Mood normal // no visual hallucinations  ? ?Data Reviewed: ? ?Hgb 8.5 ? ?Family Communication: pt in room, family not at bedside ? ?Disposition: ?Status is: Inpatient ?Remains inpatient appropriate because: severity of illness ? Planned Discharge Destination: Home ? ? ? ? ?Author: ?Marylu Lund, MD ?02/04/2022 2:00 PM ? ?For on call review [email or web address]  ?

## 2022-02-04 NOTE — Progress Notes (Signed)
Per RN, pt will have colonoscopy in am, PIV not needed at this time. RN will re consult. ?

## 2022-02-04 NOTE — Assessment & Plan Note (Addendum)
Fairly anxious on admission which was likely cause of her tachypnea and tachycardia.  Significant work-up for pulmonary process negative. ?-Continued home Xanax 0.5 mg twice daily as needed ?

## 2022-02-04 NOTE — Progress Notes (Signed)
Pt's IV got bad. Pt refused to get a new IV at this time. Educated pt the importance getting fluids at the moment. Notified hospitalist. Will try to get IV later this AM. ?

## 2022-02-04 NOTE — Assessment & Plan Note (Signed)
Continue Synthroid °

## 2022-02-04 NOTE — Assessment & Plan Note (Addendum)
Patient with 3 days of bright red rectal bleeding.  History of prior diverticular bleed.  Hemoglobin 12.2 to 8.6 this AM  ?-noted to have continued bleed during prep ?-GI recs noted. Underwent colonoscopy 5/4 with findings of diverticulosis and multiple polyps ?

## 2022-02-05 ENCOUNTER — Encounter: Payer: Medicare HMO | Admitting: Physical Therapy

## 2022-02-05 ENCOUNTER — Inpatient Hospital Stay (HOSPITAL_COMMUNITY): Payer: Medicare HMO | Admitting: Certified Registered"

## 2022-02-05 ENCOUNTER — Encounter (HOSPITAL_COMMUNITY)
Admission: EM | Disposition: A | Discharge status: Home / Self Care | Payer: Self-pay | Source: Home / Self Care | Attending: Internal Medicine

## 2022-02-05 ENCOUNTER — Encounter (HOSPITAL_COMMUNITY): Payer: Self-pay | Admitting: Internal Medicine

## 2022-02-05 DIAGNOSIS — K5731 Diverticulosis of large intestine without perforation or abscess with bleeding: Secondary | ICD-10-CM

## 2022-02-05 DIAGNOSIS — K640 First degree hemorrhoids: Secondary | ICD-10-CM

## 2022-02-05 DIAGNOSIS — K625 Hemorrhage of anus and rectum: Secondary | ICD-10-CM | POA: Diagnosis not present

## 2022-02-05 DIAGNOSIS — F419 Anxiety disorder, unspecified: Secondary | ICD-10-CM | POA: Diagnosis not present

## 2022-02-05 DIAGNOSIS — K635 Polyp of colon: Secondary | ICD-10-CM

## 2022-02-05 HISTORY — PX: POLYPECTOMY: SHX5525

## 2022-02-05 HISTORY — PX: BIOPSY: SHX5522

## 2022-02-05 HISTORY — PX: COLONOSCOPY: SHX5424

## 2022-02-05 LAB — CBC
HCT: 25.5 % — ABNORMAL LOW (ref 36.0–46.0)
Hemoglobin: 8.6 g/dL — ABNORMAL LOW (ref 12.0–15.0)
MCH: 31.5 pg (ref 26.0–34.0)
MCHC: 33.7 g/dL (ref 30.0–36.0)
MCV: 93.4 fL (ref 80.0–100.0)
Platelets: 180 10*3/uL (ref 150–400)
RBC: 2.73 MIL/uL — ABNORMAL LOW (ref 3.87–5.11)
RDW: 13.5 % (ref 11.5–15.5)
WBC: 7.4 10*3/uL (ref 4.0–10.5)
nRBC: 0 % (ref 0.0–0.2)

## 2022-02-05 LAB — COMPREHENSIVE METABOLIC PANEL
ALT: 19 U/L (ref 0–44)
AST: 24 U/L (ref 15–41)
Albumin: 3 g/dL — ABNORMAL LOW (ref 3.5–5.0)
Alkaline Phosphatase: 48 U/L (ref 38–126)
Anion gap: 5 (ref 5–15)
BUN: 9 mg/dL (ref 8–23)
CO2: 21 mmol/L — ABNORMAL LOW (ref 22–32)
Calcium: 8.3 mg/dL — ABNORMAL LOW (ref 8.9–10.3)
Chloride: 113 mmol/L — ABNORMAL HIGH (ref 98–111)
Creatinine, Ser: 0.64 mg/dL (ref 0.44–1.00)
GFR, Estimated: >60 mL/min (ref >60)
Glucose, Bld: 103 mg/dL — ABNORMAL HIGH (ref 70–99)
Potassium: 3.8 mmol/L (ref 3.5–5.1)
Sodium: 139 mmol/L (ref 135–145)
Total Bilirubin: 0.5 mg/dL (ref 0.3–1.2)
Total Protein: 5.2 g/dL — ABNORMAL LOW (ref 6.5–8.1)

## 2022-02-05 SURGERY — COLONOSCOPY
Anesthesia: Monitor Anesthesia Care

## 2022-02-05 MED ORDER — PROPOFOL 1000 MG/100ML IV EMUL
INTRAVENOUS | Status: AC
  Filled 2022-02-05: qty 100

## 2022-02-05 MED ORDER — PROPOFOL 10 MG/ML IV BOLUS
INTRAVENOUS | Status: DC | PRN
Start: 2022-02-05 — End: 2022-02-05
  Administered 2022-02-05: 20 mg via INTRAVENOUS

## 2022-02-05 MED ORDER — LACTATED RINGERS IV SOLN: INTRAVENOUS | Status: DC | PRN

## 2022-02-05 MED ORDER — LACTATED RINGERS IV SOLN
INTRAVENOUS | Status: AC | PRN
Start: 2022-02-05 — End: 2022-02-05
  Administered 2022-02-05: 1000 mL via INTRAVENOUS

## 2022-02-05 MED ORDER — PROPOFOL 10 MG/ML IV BOLUS
INTRAVENOUS | Status: AC
  Filled 2022-02-05: qty 20

## 2022-02-05 MED ORDER — PROPOFOL 500 MG/50ML IV EMUL
INTRAVENOUS | Status: DC | PRN
  Administered 2022-02-05: 125 ug/kg/min via INTRAVENOUS

## 2022-02-05 NOTE — Transfer of Care (Signed)
Immediate Anesthesia Transfer of Care Note ? ?Patient: Sarah [name] ? ?Procedure(s) Performed: COLONOSCOPY ?POLYPECTOMY ? ?Patient Location: PACU ? ?Anesthesia Type:MAC ? ?Level of Consciousness: awake, alert  and oriented ? ?Airway & Oxygen Therapy: Patient Spontanous Breathing and Patient connected to face mask oxygen ? ?Post-op Assessment: Report given to RN, Post -op Vital signs reviewed and stable and Patient moving all extremities X 4 ? ?Post vital signs: Reviewed and stable ? ?Last Vitals:  ?Vitals Value Taken Time  ?BP 112/59   ?Temp    ?Pulse 81   ?Resp 12   ?SpO2 100   ? ? ?Last Pain:  ?Vitals:  ? 02/05/22 1240  ?TempSrc: Tympanic  ?PainSc: 0-No pain  ?   ? ?Patients Stated Pain Goal: 2 (02/04/22 1940) ? ?Complications: No notable events documented. ?

## 2022-02-05 NOTE — Progress Notes (Signed)
?  Progress Note ? ? ?Patient: Sarah [name] ZGY:[id number] DOB: 06/25/1951 DOA: 02/02/2022     2 ?DOS: the patient was seen and examined on 02/05/2022 ?  ?Brief hospital course: ?Sarah [name] is a 71 y.o. female with medical history significant for hypothyroidism, osteoarthritis s/p right THA, anxiety, diverticulosis with history of lower GI bleed, GERD, hiatal hernia s/p Nissen fundoplication who is admitted with rectal bleeding. ? ?Patient reports 3 days of rectal bleeding, worsening this morning (5/1).  She has noted large amount of blood from rectum.  She has had intermittent diarrhea as well.  She noticed dark clots during bowel movement while in the ED.  She has felt somewhat short of breath.  She is feeling anxious.  She reports history of lower GI bleeding about 10 years ago at which time she had several polyps resected on colonoscopy. ?  ?She denies any use of blood thinners.  She denies any chest pain, nausea, vomiting, cough, abdominal pain, dysuria. ?  ?5/2: Hemoglobin with little downward trend, and at 10.2 this morning.  Last bloody bowel movement was 8 AM.  GI was consulted-pending. ?After discussing with GI she was started on clear liquid diet and then they will determine the need for EGD and or colonoscopy. ?Initially had mild lactic acidosis which has been resolved. ?Mild hypokalemia which is being repleted. ?Rest of the labs were unremarkable. ?Chest x-ray without any acute abnormality. ?CTA chest/abdomen/pelvis negative for evidence of PE or active/acute cardiopulmonary process.  Moderate size hiatal hernia with surgical sutures within the adjacent portion of stomach noted.  Noninflamed diverticulosis seen.  Mild coronary artery calcification also seen. ?Patient also has an history of hemorrhoids. ? ?Assessment and Plan: ?* BRBPR (bright red blood per rectum) ?Patient with 3 days of bright red rectal bleeding.  History of prior diverticular bleed.  Hemoglobin 12.2 to 8.6 this AM  ?-noted to have  continued bleed during prep yesterday ?-GI recs noted. Plan for colonoscopy today ? ?Hypothyroidism ?-Continue Synthroid. ? ?Anxiety ?Fairly anxious on admission which was likely cause of her tachypnea and tachycardia.  Significant work-up for pulmonary process negative. ?-Continue home Xanax 0.5 mg twice daily as needed ? ?Osteoarthritis ?-Pt reports long hx of arthritis, little improvement with ultram or large amounts of tylenol prior to admit ?-Started PRN oxycodone while in hospital ? ? ? ? ?  ? ?Subjective: Eager to have procedure this afternoon ? ?Physical Exam: ?Vitals:  ? 02/04/22 0908 02/04/22 1311 02/04/22 2135 02/05/22 0553  ?BP: 127/69 126/73 (!) 106/94 118/63  ?Pulse: (!) 107 96 93 74  ?Resp: '18 15 16 16  '$ ?Temp: 97.6 ?F (36.4 ?C) 98.5 ?F (36.9 ?C) 97.6 ?F (36.4 ?C) 97.8 ?F (36.6 ?C)  ?TempSrc: Oral Oral Oral Oral  ?SpO2: 99% 100% 99% 97%  ?Weight:      ?Height:      ? ?General exam: Conversant, in no acute distress ?Respiratory system: normal chest rise, clear, no audible wheezing ?Cardiovascular system: regular rhythm, s1-s2 ?Gastrointestinal system: Nondistended, nontender, pos BS ?Central nervous system: No seizures, no tremors ?Extremities: No cyanosis, no joint deformities ?Skin: No rashes, no pallor ?Psychiatry: Affect normal // no auditory hallucinations  ? ?Data Reviewed: ? ?Labs reviewed: Hgb 8.6 this AM ? ?Family Communication: pt in room, family not at bedside ? ?Disposition: ?Status is: Inpatient ?Remains inpatient appropriate because: severity of illness ? Planned Discharge Destination: Home ? ? ? ? ?Author: ?Marylu Lund, MD ?02/05/2022 9:39 AM ? ?For on call review [email or web address]  ?

## 2022-02-05 NOTE — Anesthesia Procedure Notes (Signed)
Procedure Name: [place] ?Date/Time: 02/05/2022 1:13 PM ?Performed by: Niel Hummer, CRNA ?Pre-anesthesia Checklist: Patient identified, Emergency Drugs available, Suction available and Patient being monitored ?Oxygen Delivery Method: Simple face mask ? ? ? ? ?

## 2022-02-05 NOTE — Anesthesia Preprocedure Evaluation (Signed)
Anesthesia Evaluation  ?Patient identified by MRN, date of birth, ID band ?Patient awake ? ? ? ?Reviewed: ?Allergy & Precautions, NPO status , Patient's Chart, lab work & pertinent test results ? ?Airway ?Mallampati: II ? ?TM Distance: >3 FB ?Neck ROM: Full ? ? ? Dental ?no notable dental hx. ? ?  ?Pulmonary ?neg pulmonary ROS,  ?  ?Pulmonary exam normal ?breath sounds clear to auscultation ? ? ? ? ? ? Cardiovascular ?Normal cardiovascular exam ?Rhythm:Regular Rate:Normal ? ? ?  ?Neuro/Psych ?Anxiety CVA, No Residual Symptoms   ? GI/Hepatic ?Neg liver ROS, GERD  Medicated,  ?Endo/Other  ?Hypothyroidism  ? Renal/GU ?negative Renal ROS  ?negative genitourinary ?  ?Musculoskeletal ?negative musculoskeletal ROS ?(+) Arthritis ,  ? Abdominal ?  ?Peds ?negative pediatric ROS ?(+)  Hematology ? ?(+) Blood dyscrasia, anemia ,   ?Anesthesia Other Findings ? ? Reproductive/Obstetrics ?negative OB ROS ? ?  ? ? ? ? ? ? ? ? ? ? ? ? ? ?  ?  ? ? ? ? ? ? ? ? ?Anesthesia Physical ?Anesthesia Plan ? ?ASA: 3 ? ?Anesthesia Plan: MAC  ? ?Post-op Pain Management: Minimal or no pain anticipated  ? ?Induction: Intravenous ? ?PONV Risk Score and Plan: 2 and Propofol infusion and Treatment may vary due to age or medical condition ? ?Airway Management Planned: Simple Face Mask ? ?Additional Equipment:  ? ?Intra-op Plan:  ? ?Post-operative Plan:  ? ?Informed Consent: I have reviewed the patients History and Physical, chart, labs and discussed the procedure including the risks, benefits and alternatives for the proposed anesthesia with the patient or authorized representative who has indicated his/her understanding and acceptance.  ? ? ? ?Dental advisory given ? ?Plan Discussed with: CRNA and Surgeon ? ?Anesthesia Plan Comments:   ? ? ? ? ? ? ?Anesthesia Quick Evaluation ? ?

## 2022-02-05 NOTE — Op Note (Signed)
[facility] ?Patient Name: Sarah [name] ?Procedure Date: 02/05/2022 ?MRN: [id number] ?Attending MD: Lear Ng , MD ?Date of Birth: November 21, 1950 ?CSN: [id number] ?Age: 71 ?Admit Type: Outpatient ?Procedure:                Colonoscopy ?Indications:              Last colonoscopy: October 2019, Hematochezia ?Providers:                Lear Ng, MD, Elmer Ramp. Tilden Dome, RN,  ?                          Despina Pole, Technician, Maudry Diego,  ?                          CRNA ?Referring MD:             hospital team ?Medicines:                Propofol per Anesthesia, Monitored Anesthesia Care ?Complications:            No immediate complications. ?Estimated Blood Loss:     Estimated blood loss was minimal. ?Procedure:                Pre-Anesthesia Assessment: ?                          - Prior to the procedure, a History and Physical  ?                          was performed, and patient medications and  ?                          allergies were reviewed. The patient's tolerance of  ?                          previous anesthesia was also reviewed. The risks  ?                          and benefits of the procedure and the sedation  ?                          options and risks were discussed with the patient.  ?                          All questions were answered, and informed consent  ?                          was obtained. Prior Anticoagulants: The patient has  ?                          taken no previous anticoagulant or antiplatelet  ?                          agents. ASA Grade Assessment: III - A patient with  ?  severe systemic disease. After reviewing the risks  ?                          and benefits, the patient was deemed in  ?                          satisfactory condition to undergo the procedure. ?                          After obtaining informed consent, the colonoscope  ?                          was passed under direct vision. Throughout the  ?                           procedure, the patient's blood pressure, pulse, and  ?                          oxygen saturations were monitored continuously. The  ?                          [id number]-HQ190L ([id number]) Olympus colonoscope was  ?                          introduced through the anus and advanced to the the  ?                          cecum, identified by appendiceal orifice and  ?                          ileocecal valve. The colonoscopy was performed with  ?                          difficulty due to fair prep. Successful completion  ?                          of the procedure was aided by straightening and  ?                          shortening the scope to obtain bowel loop reduction  ?                          and lavage. The patient tolerated the procedure  ?                          well. The quality of the bowel preparation was fair  ?                          except the cecum was poor. The terminal ileum,  ?                          ileocecal valve, appendiceal orifice, and rectum  ?  were photographed. ?Scope In: 1:20:19 PM ?Scope Out: 1:39:37 PM ?Scope Withdrawal Time: 0 hours 16 minutes 28 seconds  ?Total Procedure Duration: 0 hours 19 minutes 18 seconds  ?Findings: ?     The perianal and digital rectal examinations were normal. ?     Two semi-sessile polyps were found in the descending colon. The polyps  ?     were 2 to 4 mm in size. These polyps were removed with a cold biopsy  ?     forceps. Resection and retrieval were complete. Estimated blood loss was  ?     minimal. ?     A 2 mm polyp was found in the sigmoid colon. The polyp was semi-sessile.  ?     The polyp was removed with a cold biopsy forceps. Resection and  ?     retrieval were complete. Estimated blood loss was minimal. ?     A 5 mm polyp was found in the sigmoid colon. The polyp was sessile. The  ?     polyp was removed with a hot snare. Resection and retrieval were  ?     complete. Estimated blood loss: none. ?      Multiple small and large-mouthed diverticula were found in the sigmoid  ?     colon, descending colon and ascending colon. ?     Hematin (altered blood/coffee-ground-like material) was found in the  ?     sigmoid colon. ?     The terminal ileum appeared normal. ?     Internal hemorrhoids were found during retroflexion. The hemorrhoids  ?     were medium-sized and Grade I (internal hemorrhoids that do not  ?     prolapse). ?Impression:               - Two 2 to 4 mm polyps in the descending colon,  ?                          removed with a cold biopsy forceps. Resected and  ?                          retrieved. ?                          - One 2 mm polyp in the sigmoid colon, removed with  ?                          a cold biopsy forceps. Resected and retrieved. ?                          - One 5 mm polyp in the sigmoid colon, removed with  ?                          a hot snare. Resected and retrieved. ?                          - Diverticulosis in the sigmoid colon, in the  ?                          descending colon and in the ascending colon. ?                          -  Blood in the sigmoid colon. ?                          - The examined portion of the ileum was normal. ?                          - Internal hemorrhoids. ?Moderate Sedation: ?     N/A - MAC procedure ?Recommendation:           - Diabetic (ADA) diet. ?                          - Await pathology results. ?                          - Repeat colonoscopy for surveillance based on  ?                          pathology results. ?Procedure Code(s):        --- Professional --- ?                          [phone], Colonoscopy, flexible; with removal of  ?                          tumor(s), polyp(s), or other lesion(s) by snare  ?                          technique ?                          45380, 59, Colonoscopy, flexible; with biopsy,  ?                          single or multiple ?Diagnosis Code(s):        --- Professional --- ?                           K92.2, Gastrointestinal hemorrhage, unspecified ?                          K92.1, Melena (includes Hematochezia) ?                          K63.5, Polyp of colon ?                          K64.0, First degree hemorrhoids ?                          K57.30, Diverticulosis of large intestine without  ?                          perforation or abscess without bleeding ?CPT copyright 2019 [employer]. All rights reserved. ?The codes documented in this report are preliminary and upon coder review may  ?be revised to meet current compliance requirements. ?Lear Ng, MD ?02/05/2022 1:51:51 PM ?This report has been signed electronically. ?Number of Addenda: 0 ?

## 2022-02-05 NOTE — Anesthesia Postprocedure Evaluation (Signed)
Anesthesia Post Note ? ?Patient: ANNALYCIA DONE ? ?Procedure(s) Performed: COLONOSCOPY ?POLYPECTOMY ? ?  ? ?Patient location during evaluation: PACU ?Anesthesia Type: MAC ?Level of consciousness: awake and alert ?Pain management: pain level controlled ?Vital Signs Assessment: post-procedure vital signs reviewed and stable ?Respiratory status: spontaneous breathing, nonlabored ventilation, respiratory function stable and patient connected to nasal cannula oxygen ?Cardiovascular status: stable and blood pressure returned to baseline ?Postop Assessment: no apparent nausea or vomiting ?Anesthetic complications: no ? ? ?No notable events documented. ? ?Last Vitals:  ?Vitals:  ? 02/05/22 1240 02/05/22 1346  ?BP: 139/79 (!) 112/59  ?Pulse: 92 77  ?Resp: 15 17  ?Temp: 36.7 ?C (!) 36.2 ?C  ?SpO2: 94% 99%  ?  ?Last Pain:  ?Vitals:  ? 02/05/22 1346  ?TempSrc: Temporal  ?PainSc: 0-No pain  ? ? ?  ?  ?  ?  ?  ?  ? ?, S ? ? ? ? ?

## 2022-02-05 NOTE — Interval H&P Note (Signed)
History and Physical Interval Note: ? ?02/05/2022 ?1:04 PM ? ?Sarah [name]  has presented today for surgery, with the diagnosis of hematemesis.  The various methods of treatment have been discussed with the patient and family. After consideration of risks, benefits and other options for treatment, the patient has consented to  Procedure(s): ?COLONOSCOPY (N/A) as a surgical intervention.  The patient's history has been reviewed, patient examined, no change in status, stable for surgery.  I have reviewed the patient's chart and labs.  Questions were answered to the patient's satisfaction.   ? ? ?Lear Ng ? ? ?

## 2022-02-06 ENCOUNTER — Encounter (HOSPITAL_COMMUNITY): Payer: Self-pay | Admitting: Gastroenterology

## 2022-02-06 DIAGNOSIS — K640 First degree hemorrhoids: Secondary | ICD-10-CM | POA: Diagnosis not present

## 2022-02-06 DIAGNOSIS — K625 Hemorrhage of anus and rectum: Secondary | ICD-10-CM | POA: Diagnosis not present

## 2022-02-06 DIAGNOSIS — K921 Melena: Secondary | ICD-10-CM | POA: Diagnosis not present

## 2022-02-06 DIAGNOSIS — K635 Polyp of colon: Secondary | ICD-10-CM | POA: Diagnosis not present

## 2022-02-06 DIAGNOSIS — F419 Anxiety disorder, unspecified: Secondary | ICD-10-CM | POA: Diagnosis not present

## 2022-02-06 DIAGNOSIS — K922 Gastrointestinal hemorrhage, unspecified: Secondary | ICD-10-CM | POA: Diagnosis not present

## 2022-02-06 LAB — CBC
HCT: 28.4 % — ABNORMAL LOW (ref 36.0–46.0)
Hemoglobin: 9.3 g/dL — ABNORMAL LOW (ref 12.0–15.0)
MCH: 30.9 pg (ref 26.0–34.0)
MCHC: 32.7 g/dL (ref 30.0–36.0)
MCV: 94.4 fL (ref 80.0–100.0)
Platelets: 250 10*3/uL (ref 150–400)
RBC: 3.01 MIL/uL — ABNORMAL LOW (ref 3.87–5.11)
RDW: 13.5 % (ref 11.5–15.5)
WBC: 9.5 10*3/uL (ref 4.0–10.5)
nRBC: 0 % (ref 0.0–0.2)

## 2022-02-06 LAB — SURGICAL PATHOLOGY

## 2022-02-06 MED ORDER — OXYCODONE HCL 5 MG PO TABS: 5.0000 mg | ORAL_TABLET | Freq: Four times a day (QID) | ORAL | 0 refills | Status: DC | PRN

## 2022-02-06 NOTE — Discharge Summary (Signed)
?Physician Discharge Summary ?  ?Patient: Sarah [name] MRN: [id number] DOB: [birth year]  ?Admit date:     02/02/2022  ?Discharge date: 02/06/22  ?Discharge Physician: Marylu Lund  ? ?PCP: Sharilyn Sites, MD  ? ?Recommendations at discharge:  ? ? Follow up with PCP in 1-2 weeks ? ?[place] reviewed. No recent narcotics prescribed recently. Brief course of narcotic prescribed for uncontrolled arthritic pains ? ?Discharge Diagnoses: ?Principal Problem: ?  BRBPR (bright red blood per rectum) ?Active Problems: ?  Hypothyroidism ?  Anxiety ?  Osteoarthritis ? ?Resolved Problems: ?  * No resolved hospital problems. * ? ?Hospital Course: ?Sarah [name] is a 71 y.o. female with medical history significant for hypothyroidism, osteoarthritis s/p right THA, anxiety, diverticulosis with history of lower GI bleed, GERD, hiatal hernia s/p Nissen fundoplication who is admitted with rectal bleeding. ? ?Patient reports 3 days of rectal bleeding, worsening this morning (5/1).  She has noted large amount of blood from rectum.  She has had intermittent diarrhea as well.  She noticed dark clots during bowel movement while in the ED.  She has felt somewhat short of breath.  She is feeling anxious.  She reports history of lower GI bleeding about 10 years ago at which time she had several polyps resected on colonoscopy. ?  ?She denies any use of blood thinners.  She denies any chest pain, nausea, vomiting, cough, abdominal pain, dysuria. ?  ?5/2: Hemoglobin with little downward trend, and at 10.2 this morning.  Last bloody bowel movement was 8 AM.  GI was consulted-pending. ?After discussing with GI she was started on clear liquid diet and then they will determine the need for EGD and or colonoscopy. ?Initially had mild lactic acidosis which has been resolved. ?Mild hypokalemia which is being repleted. ?Rest of the labs were unremarkable. ?Chest x-ray without any acute abnormality. ?CTA chest/abdomen/pelvis negative for evidence of PE or  active/acute cardiopulmonary process.  Moderate size hiatal hernia with surgical sutures within the adjacent portion of stomach noted.  Noninflamed diverticulosis seen.  Mild coronary artery calcification also seen. ?Patient also has an history of hemorrhoids. ? ?Assessment and Plan: ?* BRBPR (bright red blood per rectum) ?Patient with 3 days of bright red rectal bleeding.  History of prior diverticular bleed.  Hemoglobin 12.2 to 8.6 this AM  ?-noted to have continued bleed during prep ?-GI recs noted. Underwent colonoscopy 5/4 with findings of diverticulosis and multiple polyps ? ?Hypothyroidism ?-Continue Synthroid. ? ?Anxiety ?Fairly anxious on admission which was likely cause of her tachypnea and tachycardia.  Significant work-up for pulmonary process negative. ?-Continued home Xanax 0.5 mg twice daily as needed ? ?Osteoarthritis ?-Pt reports long hx of arthritis, little improvement with ultram or large amounts of tylenol prior to admit ?-Started PRN oxycodone while in hospital with good results ? ? ? ? ?  ? ? ?Consultants: GI ?Procedures performed: Colonoscopy  ?Disposition: Home ?Diet recommendation:  ?Regular diet ?DISCHARGE MEDICATION: ?Allergies as of 02/06/2022   ? ?   Reactions  ? Penicillins Itching, Other (See Comments)  ? Tolerated Cephalosporins 04/24/2020. ?Has patient had a PCN reaction causing immediate rash, facial/tongue/throat swelling, SOB or lightheadedness with hypotension: Yes ?Has patient had a PCN reaction causing severe rash involving mucus membranes or skin necrosis: No ?Has patient had a PCN reaction that required hospitalization No ?Has patient had a PCN reaction occurring within the last 10 years: No ?Patient states allergy noted in the 1970s.  ? ?  ? ?  ?Medication List  ?  ? ?  STOP taking these medications   ? ?estradiol 1 MG tablet ?Commonly known as: ESTRACE ?  ?lidocaine 4 % ?  ?mirabegron ER 50 MG Tb24 tablet ?Commonly known as: Myrbetriq ?  ?traMADol 50 MG tablet ?Commonly known  as: ULTRAM ?  ? ?  ? ?TAKE these medications   ? ?acetaminophen 650 MG CR tablet ?Commonly known as: TYLENOL ?Take 1,300 mg by mouth every 6 (six) hours. ?  ?ALPRAZolam 0.5 MG tablet ?Commonly known as: Duanne Moron ?Take 0.5 mg by mouth 2 (two) times daily as needed for anxiety. ?  ?diphenhydrAMINE 25 MG tablet ?Commonly known as: BENADRYL ?Take 50 mg by mouth at bedtime. ?  ?esomeprazole 20 MG capsule ?Commonly known as: [place] ?Take 20 mg by mouth daily before breakfast. ?  ?furosemide 40 MG tablet ?Commonly known as: LASIX ?Take 40 mg by mouth daily as needed for fluid or edema. ?  ?loratadine 10 MG tablet ?Commonly known as: CLARITIN ?Take 10 mg by mouth in the morning. ?  ?oxyCODONE 5 MG immediate release tablet ?Commonly known as: Oxy IR/ROXICODONE ?Take 1 tablet (5 mg total) by mouth every 6 (six) hours as needed for severe pain. ?  ?Synthroid 50 MCG tablet ?Generic drug: levothyroxine ?Take 50 mcg by mouth daily before breakfast. ?  ?tiZANidine 4 MG tablet ?Commonly known as: ZANAFLEX ?Take 2 mg by mouth every 6 (six) hours as needed for muscle spasms. ?  ? ?  ? ? Follow-up Information   ? ? Sharilyn Sites, MD Follow up in 2 week(s).   ?Specialty: Family Medicine ?Why: Hospital follow up ?Contact information: ?[place] ?[place] [address] ?[phone] ? ? ?  ?  ? ?  ?  ? ?  ? ?Discharge Exam: ?Filed Weights  ? 02/02/22 2221 02/05/22 1240  ?Weight: 74.6 kg 74.6 kg  ? ?General exam: Awake, laying in bed, in nad ?Respiratory system: Normal respiratory effort, no wheezing ?Cardiovascular system: regular rate, s1, s2 ?Gastrointestinal system: Soft, nondistended, positive BS ?Central nervous system: CN2-12 grossly intact, strength intact ?Extremities: Perfused, no clubbing ?Skin: Normal skin turgor, no notable skin lesions seen ?Psychiatry: Mood normal // no visual hallucinations  ? ?Condition at discharge: fair ? ?The results of significant diagnostics from this hospitalization (including imaging,  microbiology, ancillary and laboratory) are listed below for reference.  ? ?Imaging Studies: ?CT Angio Chest PE W/Cm &/Or Wo Cm ? ?Result Date: 02/02/2022 ?CLINICAL DATA:  Diarrhea with bright red blood per rectum. EXAM: CT ANGIOGRAPHY CHEST WITH CONTRAST TECHNIQUE: Multidetector CT imaging of the chest was performed using the standard protocol during bolus administration of intravenous contrast. Multiplanar CT image reconstructions and MIPs were obtained to evaluate the vascular anatomy. RADIATION DOSE REDUCTION: This exam was performed according to the departmental dose-optimization program which includes automated exposure control, adjustment of the mA and/or kV according to patient size and/or use of iterative reconstruction technique. CONTRAST:  195m OMNIPAQUE IOHEXOL 350 MG/ML SOLN COMPARISON:  April 14, 2021 FINDINGS: Cardiovascular: There is mild calcification of the thoracic aorta, without evidence of aortic aneurysm. Satisfactory opacification of the pulmonary arteries to the segmental level. No evidence of pulmonary embolism. Normal heart size with mild coronary artery calcification. No pericardial effusion. Mediastinum/Nodes: No enlarged mediastinal, hilar, or axillary lymph nodes. Thyroid gland, trachea, and esophagus demonstrate no significant findings. Lungs/Pleura: Lungs are clear. No pleural effusion or pneumothorax. Upper Abdomen: There is a moderate-sized hiatal hernia with surgical sutures seen within the adjacent portion of the stomach. Multiple surgical clips are seen within  the gallbladder fossa. Noninflamed diverticula are seen within the visualized portion of the large bowel. Musculoskeletal: No chest wall abnormality. No acute or significant osseous findings. Review of the MIP images confirms the above findings. IMPRESSION: 1. No evidence of pulmonary embolism. 2. No acute or active cardiopulmonary disease. 3. Moderate-sized hiatal hernia with surgical sutures within the adjacent portion of  the stomach. 4. Noninflamed diverticulosis. 5. Mild coronary artery calcification. Aortic Atherosclerosis (ICD10-I70.0). Electronically Signed   By: Virgina Norfolk M.D.   On: 02/02/2022 18:54  ? ?CT ABDOMEN PELV

## 2022-02-06 NOTE — Progress Notes (Signed)
Discharge instructions given to patient and all questions were answered.  

## 2022-02-06 NOTE — Progress Notes (Signed)
[facility] Progress Note ? ?NOREENE BOREMAN 71 y.o. Aug 14, 1951 ? ? ?Subjective: ?Feels good. Denies abdominal pain. Reports BM overnight without blood in it. Tolerating solid food. ? ?Objective: ?Vital signs: ?Vitals:  ? 02/05/22 2131 02/06/22 0630  ?BP: (!) 113/55 117/74  ?Pulse: 88 91  ?Resp: 18 18  ?Temp: 98.1 ?F (36.7 ?C) 98.2 ?F (36.8 ?C)  ?SpO2: 97% 100%  ? ? ?Physical Exam: ?Gen: alert, no acute distress, well-nourished, pleasant ?HEENT: anicteric sclera ?CV: RRR ?Chest: CTA B ?Abd: soft, nontender, nondistended, +BS ?Ext: no edema ? ?Lab Results: ?Recent Labs  ?  02/03/22 ?7858 02/04/22 ?0532 02/05/22 ?8502  ?NA  --  142 139  ?K  --  3.7 3.8  ?CL  --  116* 113*  ?CO2  --  23 21*  ?GLUCOSE  --  109* 103*  ?BUN  --  14 9  ?CREATININE  --  0.70 0.64  ?CALCIUM  --  8.2* 8.3*  ?MG 2.1  --   --   ? ?Recent Labs  ?  02/05/22 ?0428  ?AST 24  ?ALT 19  ?ALKPHOS 48  ?BILITOT 0.5  ?PROT 5.2*  ?ALBUMIN 3.0*  ? ?Recent Labs  ?  02/04/22 ?0532 02/05/22 ?0541  ?WBC 6.0 7.4  ?HGB 8.5* 8.6*  ?HCT 26.1* 25.5*  ?MCV 94.2 93.4  ?PLT 164 180  ? ? ? ? ?Assessment/Plan: ?S/P lower GI bleed likely diverticular that has resolved. Ok to go home today from GI standpoint. Patient reports having an appt already with our office on 03/06/22. Our office will call next week to f/u on polyp results. Will sign off. Call if questions. ? ? ?Lear Ng ?02/06/2022, 8:33 AM ? ?Questions please call [phone] Patient ID: TYWANDA RICE, female   DOB: Feb 19, 1951, 71 y.o.   MRN: [id number] ? ?

## 2022-02-06 NOTE — Care Management Important Message (Signed)
Important Message ? ?Patient Details IM Letter given to the Patient. ?Name: Sarah [name] ?MRN: [id number] ?Date of Birth: [birth year] ? ? ?Medicare Important Message Given:  Yes ? ? ? ? ?Kerin Salen ?02/06/2022, 12:50 PM ?

## 2022-02-13 DIAGNOSIS — K51411 Inflammatory polyps of colon with rectal bleeding: Secondary | ICD-10-CM | POA: Diagnosis not present

## 2022-02-13 DIAGNOSIS — R69 Illness, unspecified: Secondary | ICD-10-CM | POA: Diagnosis not present

## 2022-02-13 DIAGNOSIS — Z6830 Body mass index (BMI) 30.0-30.9, adult: Secondary | ICD-10-CM | POA: Diagnosis not present

## 2022-02-13 DIAGNOSIS — K922 Gastrointestinal hemorrhage, unspecified: Secondary | ICD-10-CM | POA: Diagnosis not present

## 2022-03-06 ENCOUNTER — Other Ambulatory Visit: Payer: Self-pay | Admitting: Gastroenterology

## 2022-03-06 DIAGNOSIS — K625 Hemorrhage of anus and rectum: Secondary | ICD-10-CM | POA: Diagnosis not present

## 2022-03-06 DIAGNOSIS — D649 Anemia, unspecified: Secondary | ICD-10-CM | POA: Diagnosis not present

## 2022-03-06 DIAGNOSIS — R1310 Dysphagia, unspecified: Secondary | ICD-10-CM | POA: Diagnosis not present

## 2022-03-06 DIAGNOSIS — K649 Unspecified hemorrhoids: Secondary | ICD-10-CM | POA: Diagnosis not present

## 2022-03-06 DIAGNOSIS — K219 Gastro-esophageal reflux disease without esophagitis: Secondary | ICD-10-CM | POA: Diagnosis not present

## 2022-03-11 ENCOUNTER — Ambulatory Visit
Admission: RE | Admit: 2022-03-11 | Discharge: 2022-03-11 | Disposition: A | Discharge status: Home / Self Care | Payer: Medicare HMO | Source: Ambulatory Visit | Attending: Gastroenterology | Admitting: Gastroenterology

## 2022-03-11 DIAGNOSIS — R1310 Dysphagia, unspecified: Secondary | ICD-10-CM | POA: Diagnosis not present

## 2022-03-12 DIAGNOSIS — H5200 Hypermetropia, unspecified eye: Secondary | ICD-10-CM | POA: Diagnosis not present

## 2022-03-12 DIAGNOSIS — Z0100 Encounter for examination of eyes and vision without abnormal findings: Secondary | ICD-10-CM | POA: Diagnosis not present

## 2022-03-13 ENCOUNTER — Other Ambulatory Visit: Payer: Self-pay | Admitting: Gastroenterology

## 2022-03-13 DIAGNOSIS — R1319 Other dysphagia: Secondary | ICD-10-CM

## 2022-03-19 DIAGNOSIS — Z1331 Encounter for screening for depression: Secondary | ICD-10-CM | POA: Diagnosis not present

## 2022-03-19 DIAGNOSIS — E663 Overweight: Secondary | ICD-10-CM | POA: Diagnosis not present

## 2022-03-19 DIAGNOSIS — F419 Anxiety disorder, unspecified: Secondary | ICD-10-CM | POA: Diagnosis not present

## 2022-03-19 DIAGNOSIS — E782 Mixed hyperlipidemia: Secondary | ICD-10-CM | POA: Diagnosis not present

## 2022-03-19 DIAGNOSIS — E039 Hypothyroidism, unspecified: Secondary | ICD-10-CM | POA: Diagnosis not present

## 2022-03-19 DIAGNOSIS — Z6829 Body mass index (BMI) 29.0-29.9, adult: Secondary | ICD-10-CM | POA: Diagnosis not present

## 2022-03-19 DIAGNOSIS — I10 Essential (primary) hypertension: Secondary | ICD-10-CM | POA: Diagnosis not present

## 2022-03-19 DIAGNOSIS — G894 Chronic pain syndrome: Secondary | ICD-10-CM | POA: Diagnosis not present

## 2022-03-19 DIAGNOSIS — K922 Gastrointestinal hemorrhage, unspecified: Secondary | ICD-10-CM | POA: Diagnosis not present

## 2022-03-19 DIAGNOSIS — K51411 Inflammatory polyps of colon with rectal bleeding: Secondary | ICD-10-CM | POA: Diagnosis not present

## 2022-03-19 DIAGNOSIS — R69 Illness, unspecified: Secondary | ICD-10-CM | POA: Diagnosis not present

## 2022-03-19 DIAGNOSIS — E538 Deficiency of other specified B group vitamins: Secondary | ICD-10-CM | POA: Diagnosis not present

## 2022-03-19 DIAGNOSIS — Z0001 Encounter for general adult medical examination with abnormal findings: Secondary | ICD-10-CM | POA: Diagnosis not present

## 2022-03-20 ENCOUNTER — Other Ambulatory Visit: Payer: Medicare HMO

## 2022-03-25 ENCOUNTER — Ambulatory Visit (HOSPITAL_COMMUNITY)
Admission: RE | Admit: 2022-03-25 | Discharge: 2022-03-25 | Disposition: A | Discharge status: Home / Self Care | Payer: Medicare HMO | Attending: Gastroenterology | Admitting: Gastroenterology

## 2022-03-25 ENCOUNTER — Encounter (HOSPITAL_COMMUNITY)
Admission: RE | Disposition: A | Discharge status: Home / Self Care | Payer: Self-pay | Source: Home / Self Care | Attending: Gastroenterology

## 2022-03-25 DIAGNOSIS — K2289 Other specified disease of esophagus: Secondary | ICD-10-CM | POA: Insufficient documentation

## 2022-03-25 DIAGNOSIS — R1310 Dysphagia, unspecified: Secondary | ICD-10-CM | POA: Insufficient documentation

## 2022-03-25 DIAGNOSIS — R12 Heartburn: Secondary | ICD-10-CM | POA: Diagnosis not present

## 2022-03-25 SURGERY — MANOMETRY, ESOPHAGUS

## 2022-03-25 MED ORDER — LIDOCAINE VISCOUS HCL 2 % MT SOLN
OROMUCOSAL | Status: AC
  Filled 2022-03-25: qty 15

## 2022-03-25 SURGICAL SUPPLY — 2 items
FACESHIELD LNG OPTICON STERILE (SAFETY) IMPLANT
GLOVE BIO SURGEON STRL SZ8 (GLOVE) ×4 IMPLANT

## 2022-03-25 NOTE — Progress Notes (Signed)
Esophageal Manometry done per protocol. Patient tolerated well without distress or complication.  

## 2022-03-26 ENCOUNTER — Encounter (HOSPITAL_COMMUNITY): Payer: Self-pay | Admitting: Gastroenterology

## 2022-04-02 DIAGNOSIS — K219 Gastro-esophageal reflux disease without esophagitis: Secondary | ICD-10-CM | POA: Diagnosis not present

## 2022-04-02 DIAGNOSIS — R1310 Dysphagia, unspecified: Secondary | ICD-10-CM | POA: Diagnosis not present

## 2022-04-22 DIAGNOSIS — M25551 Pain in right hip: Secondary | ICD-10-CM | POA: Diagnosis not present

## 2022-05-14 DIAGNOSIS — K219 Gastro-esophageal reflux disease without esophagitis: Secondary | ICD-10-CM | POA: Diagnosis not present

## 2022-05-14 DIAGNOSIS — K9189 Other postprocedural complications and disorders of digestive system: Secondary | ICD-10-CM | POA: Diagnosis not present

## 2022-05-14 DIAGNOSIS — K449 Diaphragmatic hernia without obstruction or gangrene: Secondary | ICD-10-CM | POA: Diagnosis not present

## 2022-05-27 ENCOUNTER — Other Ambulatory Visit: Payer: Self-pay | Admitting: Surgery

## 2022-05-27 ENCOUNTER — Other Ambulatory Visit (HOSPITAL_COMMUNITY): Payer: Self-pay

## 2022-05-27 ENCOUNTER — Other Ambulatory Visit (HOSPITAL_COMMUNITY): Payer: Self-pay | Admitting: Surgery

## 2022-05-27 DIAGNOSIS — K9189 Other postprocedural complications and disorders of digestive system: Secondary | ICD-10-CM

## 2022-05-27 DIAGNOSIS — K219 Gastro-esophageal reflux disease without esophagitis: Secondary | ICD-10-CM

## 2022-06-11 ENCOUNTER — Ambulatory Visit (HOSPITAL_COMMUNITY)
Admission: RE | Admit: 2022-06-11 | Discharge: 2022-06-11 | Disposition: A | Discharge status: Home / Self Care | Payer: Medicare HMO | Source: Ambulatory Visit | Attending: Surgery | Admitting: Surgery

## 2022-06-11 DIAGNOSIS — K9189 Other postprocedural complications and disorders of digestive system: Secondary | ICD-10-CM | POA: Insufficient documentation

## 2022-06-11 DIAGNOSIS — K449 Diaphragmatic hernia without obstruction or gangrene: Secondary | ICD-10-CM | POA: Diagnosis not present

## 2022-06-11 DIAGNOSIS — K219 Gastro-esophageal reflux disease without esophagitis: Secondary | ICD-10-CM | POA: Insufficient documentation

## 2022-06-11 DIAGNOSIS — R109 Unspecified abdominal pain: Secondary | ICD-10-CM | POA: Diagnosis not present

## 2022-06-11 MED ORDER — TECHNETIUM TC 99M SULFUR COLLOID
2.1000 | Freq: Once | INTRAVENOUS | Status: AC | PRN
  Administered 2022-06-11: 2.1 via ORAL

## 2022-08-19 DIAGNOSIS — R051 Acute cough: Secondary | ICD-10-CM | POA: Diagnosis not present

## 2022-08-19 DIAGNOSIS — J9801 Acute bronchospasm: Secondary | ICD-10-CM | POA: Diagnosis not present

## 2022-08-19 DIAGNOSIS — J0100 Acute maxillary sinusitis, unspecified: Secondary | ICD-10-CM | POA: Diagnosis not present

## 2022-08-31 DIAGNOSIS — M797 Fibromyalgia: Secondary | ICD-10-CM | POA: Diagnosis not present

## 2022-09-22 DIAGNOSIS — J0100 Acute maxillary sinusitis, unspecified: Secondary | ICD-10-CM | POA: Diagnosis not present

## 2022-09-22 DIAGNOSIS — J209 Acute bronchitis, unspecified: Secondary | ICD-10-CM | POA: Diagnosis not present

## 2022-10-13 DIAGNOSIS — Z6828 Body mass index (BMI) 28.0-28.9, adult: Secondary | ICD-10-CM | POA: Diagnosis not present

## 2022-10-13 DIAGNOSIS — R69 Illness, unspecified: Secondary | ICD-10-CM | POA: Diagnosis not present

## 2022-10-13 DIAGNOSIS — J329 Chronic sinusitis, unspecified: Secondary | ICD-10-CM | POA: Diagnosis not present

## 2022-10-13 DIAGNOSIS — E663 Overweight: Secondary | ICD-10-CM | POA: Diagnosis not present

## 2022-10-13 DIAGNOSIS — M797 Fibromyalgia: Secondary | ICD-10-CM | POA: Diagnosis not present

## 2022-10-13 DIAGNOSIS — E039 Hypothyroidism, unspecified: Secondary | ICD-10-CM | POA: Diagnosis not present

## 2022-11-05 ENCOUNTER — Ambulatory Visit (INDEPENDENT_AMBULATORY_CARE_PROVIDER_SITE_OTHER): Payer: Medicare HMO | Admitting: Obstetrics & Gynecology

## 2022-11-05 ENCOUNTER — Encounter: Payer: Self-pay | Admitting: Obstetrics & Gynecology

## 2022-11-05 VITALS — BP 134/85 | HR 93 | Ht 62.0 in | Wt 156.0 lb

## 2022-11-05 DIAGNOSIS — Z01419 Encounter for gynecological examination (general) (routine) without abnormal findings: Secondary | ICD-10-CM

## 2022-11-05 DIAGNOSIS — Z1211 Encounter for screening for malignant neoplasm of colon: Secondary | ICD-10-CM | POA: Diagnosis not present

## 2022-11-05 DIAGNOSIS — Z1212 Encounter for screening for malignant neoplasm of rectum: Secondary | ICD-10-CM | POA: Diagnosis not present
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
## 2022-11-17 DIAGNOSIS — K219 Gastro-esophageal reflux disease without esophagitis: Secondary | ICD-10-CM | POA: Diagnosis not present

## 2022-11-17 DIAGNOSIS — R69 Illness, unspecified: Secondary | ICD-10-CM | POA: Diagnosis not present

## 2022-11-17 DIAGNOSIS — Z833 Family history of diabetes mellitus: Secondary | ICD-10-CM | POA: Diagnosis not present

## 2022-11-17 DIAGNOSIS — R269 Unspecified abnormalities of gait and mobility: Secondary | ICD-10-CM | POA: Diagnosis not present

## 2022-11-17 DIAGNOSIS — R030 Elevated blood-pressure reading, without diagnosis of hypertension: Secondary | ICD-10-CM | POA: Diagnosis not present

## 2022-11-17 DIAGNOSIS — Z823 Family history of stroke: Secondary | ICD-10-CM | POA: Diagnosis not present

## 2022-11-17 DIAGNOSIS — F419 Anxiety disorder, unspecified: Secondary | ICD-10-CM | POA: Diagnosis not present

## 2022-11-17 DIAGNOSIS — M1990 Unspecified osteoarthritis, unspecified site: Secondary | ICD-10-CM | POA: Diagnosis not present

## 2022-11-17 DIAGNOSIS — Z803 Family history of malignant neoplasm of breast: Secondary | ICD-10-CM | POA: Diagnosis not present

## 2022-11-17 DIAGNOSIS — E039 Hypothyroidism, unspecified: Secondary | ICD-10-CM | POA: Diagnosis not present

## 2022-11-17 DIAGNOSIS — Z880 Allergy status to penicillin: Secondary | ICD-10-CM | POA: Diagnosis not present

## 2022-11-17 DIAGNOSIS — Z8249 Family history of ischemic heart disease and other diseases of the circulatory system: Secondary | ICD-10-CM | POA: Diagnosis not present

## 2022-11-18 DIAGNOSIS — M25551 Pain in right hip: Secondary | ICD-10-CM | POA: Diagnosis not present

## 2022-12-09 ENCOUNTER — Telehealth: Payer: Self-pay | Admitting: Obstetrics & Gynecology

## 2022-12-09 NOTE — Telephone Encounter (Signed)
Spoke with patient about her concerns with sexual activity. Pt has concerns of becoming sexually active after not being for 16 years. Recommended lubrication. Pt has no other questions at this time.

## 2022-12-09 NOTE — Telephone Encounter (Signed)
Pt states she needs further instructions on sexual activity. Please return call

## 2022-12-16 DIAGNOSIS — I639 Cerebral infarction, unspecified: Secondary | ICD-10-CM | POA: Diagnosis not present

## 2022-12-16 DIAGNOSIS — Z6828 Body mass index (BMI) 28.0-28.9, adult: Secondary | ICD-10-CM | POA: Diagnosis not present

## 2022-12-16 DIAGNOSIS — R7309 Other abnormal glucose: Secondary | ICD-10-CM | POA: Diagnosis not present

## 2022-12-16 DIAGNOSIS — E538 Deficiency of other specified B group vitamins: Secondary | ICD-10-CM | POA: Diagnosis not present

## 2022-12-16 DIAGNOSIS — E663 Overweight: Secondary | ICD-10-CM | POA: Diagnosis not present

## 2022-12-16 DIAGNOSIS — R69 Illness, unspecified: Secondary | ICD-10-CM | POA: Diagnosis not present

## 2022-12-16 DIAGNOSIS — I10 Essential (primary) hypertension: Secondary | ICD-10-CM | POA: Diagnosis not present

## 2022-12-16 DIAGNOSIS — K219 Gastro-esophageal reflux disease without esophagitis: Secondary | ICD-10-CM | POA: Diagnosis not present

## 2022-12-16 DIAGNOSIS — E039 Hypothyroidism, unspecified: Secondary | ICD-10-CM | POA: Diagnosis not present

## 2022-12-16 DIAGNOSIS — M797 Fibromyalgia: Secondary | ICD-10-CM | POA: Diagnosis not present

## 2022-12-16 DIAGNOSIS — G894 Chronic pain syndrome: Secondary | ICD-10-CM | POA: Diagnosis not present

## 2022-12-16 DIAGNOSIS — E782 Mixed hyperlipidemia: Secondary | ICD-10-CM | POA: Diagnosis not present

## 2023-01-27 DIAGNOSIS — M25551 Pain in right hip: Secondary | ICD-10-CM | POA: Diagnosis not present

## 2023-01-27 DIAGNOSIS — M545 Low back pain, unspecified: Secondary | ICD-10-CM | POA: Diagnosis not present

## 2023-03-10 DIAGNOSIS — K625 Hemorrhage of anus and rectum: Secondary | ICD-10-CM | POA: Diagnosis not present

## 2023-03-17 ENCOUNTER — Other Ambulatory Visit: Payer: Self-pay | Admitting: Gastroenterology

## 2023-03-22 NOTE — Progress Notes (Signed)
Anesthesia Review:  PCP: DR Assunta Found  Cardiologist : none  Chest x-ray : EKG : 02/04/22  Echo : 2017  Stress test: Cardiac Cath :  Activity level:  Sleep Study/ CPAP : none  Fasting Blood Sugar :      / Checks Blood Sugar -- times a day:   Blood Thinner/ Instructions /Last Dose: ASA / Instructions/ Last Dose :     Prediabetes- on no meds , does not hceck glucose at home    HX of mild stroke per pt  Completed med hx and preprocedure instrucitons with pt on 03/22/23.  Pt states that yesterday at church she came home early due to feeling weak.  .  Instructed pt to call PCP , Assunta Found and DR Bosie Clos in regards to this  and to call them today.  PT voiced understanding.  Instructed pt to go to ED if needed.  PT states she has appt with PCP on 03/24/23.

## 2023-03-24 ENCOUNTER — Other Ambulatory Visit (HOSPITAL_COMMUNITY): Payer: Self-pay | Admitting: Family Medicine

## 2023-03-24 DIAGNOSIS — Z1331 Encounter for screening for depression: Secondary | ICD-10-CM | POA: Diagnosis not present

## 2023-03-24 DIAGNOSIS — Z1231 Encounter for screening mammogram for malignant neoplasm of breast: Secondary | ICD-10-CM

## 2023-03-24 DIAGNOSIS — I639 Cerebral infarction, unspecified: Secondary | ICD-10-CM | POA: Diagnosis not present

## 2023-03-24 DIAGNOSIS — E039 Hypothyroidism, unspecified: Secondary | ICD-10-CM | POA: Diagnosis not present

## 2023-03-24 DIAGNOSIS — F419 Anxiety disorder, unspecified: Secondary | ICD-10-CM | POA: Diagnosis not present

## 2023-03-24 DIAGNOSIS — Z6825 Body mass index (BMI) 25.0-25.9, adult: Secondary | ICD-10-CM | POA: Diagnosis not present

## 2023-03-24 DIAGNOSIS — E663 Overweight: Secondary | ICD-10-CM | POA: Diagnosis not present

## 2023-03-24 DIAGNOSIS — R7303 Prediabetes: Secondary | ICD-10-CM | POA: Diagnosis not present

## 2023-03-24 DIAGNOSIS — Z0001 Encounter for general adult medical examination with abnormal findings: Secondary | ICD-10-CM | POA: Diagnosis not present

## 2023-03-24 DIAGNOSIS — E538 Deficiency of other specified B group vitamins: Secondary | ICD-10-CM | POA: Diagnosis not present

## 2023-03-24 DIAGNOSIS — M797 Fibromyalgia: Secondary | ICD-10-CM | POA: Diagnosis not present

## 2023-03-24 DIAGNOSIS — I10 Essential (primary) hypertension: Secondary | ICD-10-CM | POA: Diagnosis not present

## 2023-03-25 ENCOUNTER — Ambulatory Visit (HOSPITAL_COMMUNITY)
Admission: RE | Admit: 2023-03-25 | Discharge: 2023-03-25 | Disposition: A | Discharge status: Home / Self Care | Payer: Medicare HMO | Source: Ambulatory Visit | Attending: Family Medicine | Admitting: Family Medicine

## 2023-03-25 DIAGNOSIS — Z1231 Encounter for screening mammogram for malignant neoplasm of breast: Secondary | ICD-10-CM | POA: Insufficient documentation

## 2023-03-29 ENCOUNTER — Ambulatory Visit (HOSPITAL_COMMUNITY)
Admission: RE | Admit: 2023-03-29 | Discharge: 2023-03-29 | Disposition: A | Discharge status: Home / Self Care | Payer: Medicare HMO | Attending: Gastroenterology | Admitting: Gastroenterology

## 2023-03-29 ENCOUNTER — Ambulatory Visit (HOSPITAL_COMMUNITY): Payer: Medicare HMO | Admitting: Anesthesiology

## 2023-03-29 ENCOUNTER — Encounter (HOSPITAL_COMMUNITY): Payer: Self-pay | Admitting: Gastroenterology

## 2023-03-29 ENCOUNTER — Other Ambulatory Visit: Payer: Self-pay

## 2023-03-29 ENCOUNTER — Encounter (HOSPITAL_COMMUNITY)
Admission: RE | Disposition: A | Discharge status: Home / Self Care | Payer: Self-pay | Source: Home / Self Care | Attending: Gastroenterology

## 2023-03-29 ENCOUNTER — Ambulatory Visit (HOSPITAL_BASED_OUTPATIENT_CLINIC_OR_DEPARTMENT_OTHER): Payer: Medicare HMO | Admitting: Anesthesiology

## 2023-03-29 DIAGNOSIS — K219 Gastro-esophageal reflux disease without esophagitis: Secondary | ICD-10-CM | POA: Insufficient documentation

## 2023-03-29 DIAGNOSIS — Z86010 Personal history of colonic polyps: Secondary | ICD-10-CM | POA: Diagnosis not present

## 2023-03-29 DIAGNOSIS — K625 Hemorrhage of anus and rectum: Secondary | ICD-10-CM | POA: Diagnosis not present

## 2023-03-29 DIAGNOSIS — Z79899 Other long term (current) drug therapy: Secondary | ICD-10-CM | POA: Insufficient documentation

## 2023-03-29 DIAGNOSIS — D128 Benign neoplasm of rectum: Secondary | ICD-10-CM | POA: Diagnosis not present

## 2023-03-29 DIAGNOSIS — I10 Essential (primary) hypertension: Secondary | ICD-10-CM | POA: Diagnosis not present

## 2023-03-29 DIAGNOSIS — M1990 Unspecified osteoarthritis, unspecified site: Secondary | ICD-10-CM | POA: Insufficient documentation

## 2023-03-29 DIAGNOSIS — K5730 Diverticulosis of large intestine without perforation or abscess without bleeding: Secondary | ICD-10-CM

## 2023-03-29 DIAGNOSIS — K621 Rectal polyp: Secondary | ICD-10-CM | POA: Diagnosis not present

## 2023-03-29 DIAGNOSIS — M797 Fibromyalgia: Secondary | ICD-10-CM | POA: Diagnosis not present

## 2023-03-29 DIAGNOSIS — K640 First degree hemorrhoids: Secondary | ICD-10-CM | POA: Diagnosis not present

## 2023-03-29 DIAGNOSIS — E039 Hypothyroidism, unspecified: Secondary | ICD-10-CM

## 2023-03-29 DIAGNOSIS — R7303 Prediabetes: Secondary | ICD-10-CM | POA: Insufficient documentation

## 2023-03-29 DIAGNOSIS — K635 Polyp of colon: Secondary | ICD-10-CM | POA: Diagnosis not present

## 2023-03-29 HISTORY — PX: POLYPECTOMY: SHX5525

## 2023-03-29 HISTORY — PX: COLONOSCOPY WITH PROPOFOL: SHX5780

## 2023-03-29 LAB — POCT I-STAT, CHEM 8
BUN: 10 mg/dL (ref 8–23)
Calcium, Ion: 1.17 mmol/L (ref 1.15–1.40)
Chloride: 104 mmol/L (ref 98–111)
Creatinine, Ser: 0.8 mg/dL (ref 0.44–1.00)
Glucose, Bld: 92 mg/dL (ref 70–99)
HCT: 29 % — ABNORMAL LOW (ref 36.0–46.0)
Hemoglobin: 9.9 g/dL — ABNORMAL LOW (ref 12.0–15.0)
Potassium: 3.4 mmol/L — ABNORMAL LOW (ref 3.5–5.1)
Sodium: 141 mmol/L (ref 135–145)
TCO2: 25 mmol/L (ref 22–32)

## 2023-03-29 SURGERY — COLONOSCOPY WITH PROPOFOL
Anesthesia: Monitor Anesthesia Care

## 2023-03-29 MED ORDER — SODIUM CHLORIDE 0.9 % IV SOLN: INTRAVENOUS | Status: DC

## 2023-03-29 MED ORDER — PROPOFOL 500 MG/50ML IV EMUL
INTRAVENOUS | Status: DC | PRN
  Administered 2023-03-29: 125 ug/kg/min via INTRAVENOUS

## 2023-03-29 MED ORDER — LACTATED RINGERS IV SOLN: INTRAVENOUS | Status: DC

## 2023-03-29 MED ORDER — PROPOFOL 10 MG/ML IV BOLUS
INTRAVENOUS | Status: DC | PRN
  Administered 2023-03-29: 30 mg via INTRAVENOUS

## 2023-03-29 SURGICAL SUPPLY — 22 items

## 2023-03-29 NOTE — Anesthesia Postprocedure Evaluation (Signed)
Anesthesia Post Note  Patient: LAYLONIE MARZEC  Procedure(s) Performed: COLONOSCOPY WITH PROPOFOL POLYPECTOMY     Patient location during evaluation: PACU Anesthesia Type: MAC Level of consciousness: awake Pain management: pain level controlled Vital Signs Assessment: post-procedure vital signs reviewed and stable Respiratory status: spontaneous breathing, nonlabored ventilation and respiratory function stable Cardiovascular status: stable and blood pressure returned to baseline Postop Assessment: no apparent nausea or vomiting Anesthetic complications: no   No notable events documented.  Last Vitals:  Vitals:   03/29/23 1049 03/29/23 1100  BP: (!) 105/56 125/69  Pulse: 82 82  Resp: 16 17  Temp: 36.6 C   SpO2: 100% 96%    Last Pain:  Vitals:   03/29/23 1100  TempSrc:   PainSc: 0-No pain                 Linton Rump

## 2023-03-29 NOTE — Anesthesia Preprocedure Evaluation (Addendum)
Anesthesia Evaluation  Patient identified by MRN, date of birth, ID band Patient awake    Reviewed: Allergy & Precautions, NPO status , Patient's Chart, lab work & pertinent test results  History of Anesthesia Complications Negative for: history of anesthetic complications  Airway Mallampati: III  TM Distance: >3 FB Neck ROM: Full    Dental  (+) Dental Advisory Given   Pulmonary neg pulmonary ROS   Pulmonary exam normal breath sounds clear to auscultation       Cardiovascular hypertension (not on medication), (-) angina (-) Past MI, (-) Cardiac Stents and (-) CABG + dysrhythmias  Rhythm:Regular Rate:Normal     Neuro/Psych  Headaches, neg Seizures PSYCHIATRIC DISORDERS Anxiety      Neuromuscular disease (low back pain) CVA (2017), No Residual Symptoms    GI/Hepatic Neg liver ROS, Bowel prep,GERD (s/p Nissen fundoplication)  Medicated,,  Endo/Other  neg diabetesHypothyroidism  Pre-diabetes  Renal/GU negative Renal ROS     Musculoskeletal  (+) Arthritis , Osteoarthritis,  Fibromyalgia -  Abdominal   Peds  Hematology  (+) Blood dyscrasia, anemia   Anesthesia Other Findings   Reproductive/Obstetrics                             Anesthesia Physical Anesthesia Plan  ASA: 3  Anesthesia Plan: MAC   Post-op Pain Management: Minimal or no pain anticipated   Induction: Intravenous  PONV Risk Score and Plan: 2 and Propofol infusion and Treatment may vary due to age or medical condition  Airway Management Planned: Natural Airway and Nasal Cannula  Additional Equipment:   Intra-op Plan:   Post-operative Plan:   Informed Consent: I have reviewed the patients History and Physical, chart, labs and discussed the procedure including the risks, benefits and alternatives for the proposed anesthesia with the patient or authorized representative who has indicated his/her understanding and acceptance.      Dental advisory given  Plan Discussed with: Anesthesiologist and CRNA  Anesthesia Plan Comments: (Discussed with patient risks of MAC including, but not limited to, minor pain or discomfort, hearing people in the room, and possible need for backup general anesthesia. Risks for general anesthesia also discussed including, but not limited to, sore throat, hoarse voice, chipped/damaged teeth, injury to vocal cords, nausea and vomiting, allergic reactions, lung infection, heart attack, stroke, and death. All questions answered. )        Anesthesia Quick Evaluation

## 2023-03-29 NOTE — H&P (Signed)
Date of Initial H&P: 03/10/23  History reviewed, patient examined, no change in status, stable for surgery.

## 2023-03-29 NOTE — Discharge Instructions (Signed)

## 2023-03-29 NOTE — Interval H&P Note (Signed)
History and Physical Interval Note:  03/29/2023 10:04 AM  Sarah [name]  has presented today for surgery, with the diagnosis of Rectal bleed:Anemia.  The various methods of treatment have been discussed with the patient and family. After consideration of risks, benefits and other options for treatment, the patient has consented to  Procedure(s): COLONOSCOPY WITH PROPOFOL (N/A) as a surgical intervention.  The patient's history has been reviewed, patient examined, no change in status, stable for surgery.  I have reviewed the patient's chart and labs.  Questions were answered to the patient's satisfaction.     Shirley Friar

## 2023-03-29 NOTE — Transfer of Care (Signed)
Immediate Anesthesia Transfer of Care Note  Patient: Sarah [name]  Procedure(s) Performed: COLONOSCOPY WITH PROPOFOL POLYPECTOMY  Patient Location: Endoscopy Unit  Anesthesia Type:MAC  Level of Consciousness: drowsy and patient cooperative  Airway & Oxygen Therapy: Patient Spontanous Breathing and Patient connected to face mask oxygen  Post-op Assessment: Report given to RN and Post -op Vital signs reviewed and stable  Post vital signs: Reviewed and stable  Last Vitals:  Vitals Value Taken Time  BP 105/56 03/29/23 1050  Temp    Pulse 82 03/29/23 1050  Resp 16 03/29/23 1050  SpO2 100 % 03/29/23 1050  Vitals shown include unvalidated device data.  Last Pain:  Vitals:   03/29/23 0834  TempSrc: Temporal  PainSc: 0-No pain         Complications: No notable events documented.

## 2023-03-29 NOTE — Op Note (Signed)
[facility] Patient Name: Sarah [name] Procedure Date: 03/29/2023 MRN: [id number] Attending MD: Shirley Friar , MD, [id number] Date of Birth: 1951-05-08 CSN: [id number] Age: 72 Admit Type: Outpatient Procedure:                Colonoscopy Indications:              Last colonoscopy: May 2023, Rectal bleeding,                            Personal history of colonic polyps Providers:                Shirley Friar, MD, Marge Duncans, RN, Adin Hector, RN, Toula Moos, Technician, Salley Scarlet, Technician Referring MD:              Medicines:                Propofol per Anesthesia, Monitored Anesthesia Care Complications:            No immediate complications. Estimated Blood Loss:     Estimated blood loss was minimal. Procedure:                Pre-Anesthesia Assessment:                           - Prior to the procedure, a History and Physical                            was performed, and patient medications and                            allergies were reviewed. The patient's tolerance of                            previous anesthesia was also reviewed. The risks                            and benefits of the procedure and the sedation                            options and risks were discussed with the patient.                            All questions were answered, and informed consent                            was obtained. Prior Anticoagulants: The patient has                            taken no anticoagulant or antiplatelet agents. ASA  Grade Assessment: III - A patient with severe                            systemic disease. After reviewing the risks and                            benefits, the patient was deemed in satisfactory                            condition to undergo the procedure.                           After obtaining informed consent, the colonoscope                             was passed under direct vision. Throughout the                            procedure, the patient's blood pressure, pulse, and                            oxygen saturations were monitored continuously. The                            PCF-HQ190L ([id number]) Olympus colonoscope was                            introduced through the anus and advanced to the the                            cecum, identified by appendiceal orifice and                            ileocecal valve. The colonoscopy was performed                            without difficulty. The patient tolerated the                            procedure well. The quality of the bowel                            preparation was fair and fair but repeated                            irrigation led to a good and adequate prep. The                            terminal ileum, ileocecal valve, appendiceal                            orifice, and rectum were photographed. Scope In: 10:23:14 AM Scope Out: 10:45:22 AM Scope Withdrawal Time: 0 hours 19 minutes 10 seconds  Total Procedure Duration: 0 hours  22 minutes 8 seconds  Findings:      The perianal and digital rectal examinations were normal.      Seven flat, sessile and semi-sessile polyps were found in the rectum,       recto-sigmoid colon and sigmoid colon. The polyps were 1 to 5 mm in       size. These polyps were removed with a cold biopsy forceps. Resection       and retrieval were complete. Estimated blood loss was minimal.      Internal hemorrhoids were found during retroflexion. The hemorrhoids       were medium-sized and Grade I (internal hemorrhoids that do not       prolapse).      Multiple medium-mouthed and small-mouthed diverticula were found in the       sigmoid colon and descending colon.      The terminal ileum appeared normal. Impression:               - Preparation of the colon was fair.                           - Seven 1 to 5 mm polyps in the rectum, at  the                            recto-sigmoid colon and in the sigmoid colon,                            removed with a cold biopsy forceps. Resected and                            retrieved.                           - Internal hemorrhoids.                           - Diverticulosis in the sigmoid colon and in the                            descending colon.                           - The examined portion of the ileum was normal. Moderate Sedation:      N/A - MAC procedure Recommendation:           - Patient has a contact number available for                            emergencies. The signs and symptoms of potential                            delayed complications were discussed with the                            patient. Return to normal activities tomorrow.  Written discharge instructions were provided to the                            patient.                           - High fiber diet.                           - Await pathology results.                           - Repeat colonoscopy for surveillance based on                            pathology results. Procedure Code(s):        --- Professional ---                           [phone], Colonoscopy, flexible; with biopsy, single                            or multiple Diagnosis Code(s):        --- Professional ---                           Z86.010, Personal history of colonic polyps                           D12.8, Benign neoplasm of rectum                           D12.7, Benign neoplasm of rectosigmoid junction                           D12.5, Benign neoplasm of sigmoid colon                           K62.5, Hemorrhage of anus and rectum                           K64.0, First degree hemorrhoids                           K57.30, Diverticulosis of large intestine without                            perforation or abscess without bleeding CPT copyright 2022 American Medical Association. All rights  reserved. The codes documented in this report are preliminary and upon coder review may  be revised to meet current compliance requirements. Shirley Friar, MD 03/29/2023 10:56:43 AM This report has been signed electronically. Number of Addenda: 0

## 2023-03-30 LAB — SURGICAL PATHOLOGY

## 2023-04-01 ENCOUNTER — Encounter (HOSPITAL_COMMUNITY): Payer: Self-pay | Admitting: Gastroenterology

## 2023-04-12 DIAGNOSIS — D649 Anemia, unspecified: Secondary | ICD-10-CM | POA: Diagnosis not present

## 2023-04-28 DIAGNOSIS — M7061 Trochanteric bursitis, right hip: Secondary | ICD-10-CM | POA: Diagnosis not present

## 2023-05-19 DIAGNOSIS — D509 Iron deficiency anemia, unspecified: Secondary | ICD-10-CM | POA: Insufficient documentation
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
## 2023-05-20 ENCOUNTER — Encounter: Payer: Self-pay | Admitting: Hematology

## 2023-05-20 ENCOUNTER — Inpatient Hospital Stay: Payer: Medicare HMO | Attending: Hematology | Admitting: Hematology

## 2023-05-20 VITALS — BP 118/70 | HR 83 | Temp 97.9°F | Resp 18 | Ht 62.0 in | Wt 140.6 lb

## 2023-05-20 DIAGNOSIS — Z803 Family history of malignant neoplasm of breast: Secondary | ICD-10-CM | POA: Insufficient documentation

## 2023-05-20 DIAGNOSIS — D509 Iron deficiency anemia, unspecified: Secondary | ICD-10-CM | POA: Diagnosis present

## 2023-05-20 NOTE — Patient Instructions (Signed)

## 2023-05-27 ENCOUNTER — Inpatient Hospital Stay: Payer: Medicare HMO | Admitting: Licensed Clinical Social Worker

## 2023-05-27 DIAGNOSIS — D509 Iron deficiency anemia, unspecified: Secondary | ICD-10-CM

## 2023-05-27 NOTE — Progress Notes (Signed)
[facility] Clinical Social Work  Initial Assessment   Sarah [name] is a 72 y.o. year old female contacted by phone. Clinical Social Work was referred by medical provider for assessment of psychosocial needs.   SDOH (Social Determinants of Health) assessments performed: Yes SDOH Interventions    Flowsheet Row Office Visit from [facility]/20/20[facility] in [facility] Visit from 11/05/2022 in [facility]  SDOH Interventions    Food Insecurity Interventions Intervention Not Indicated Intervention Not Indicated  Housing Interventions Intervention Not Indicated Intervention Not Indicated  Transportation Interventions Intervention Not Indicated Intervention Not Indicated  Utilities Interventions Intervention Not Indicated --  Financial Strain Interventions -- Intervention Not Indicated  Physical Activity Interventions -- Intervention Not Indicated  Stress Interventions -- Intervention Not Indicated  Social Connections Interventions -- Intervention Not Indicated       SDOH Screenings   Food Insecurity: Food Insecurity Present (05/20/2023)  Housing: Low Risk  (05/20/2023)  Transportation Needs: No Transportation Needs (05/20/2023)  Utilities: Not At Risk (05/20/2023)  Alcohol Screen: Low Risk  (11/05/2022)  Depression (PHQ2-9): Low Risk  (05/20/2023)  Financial Resource Strain: Low Risk  (11/05/2022)  Physical Activity: Insufficiently Active (11/05/2022)  Social Connections: Moderately Integrated (11/05/2022)  Stress: No Stress Concern Present (11/05/2022)  Tobacco Use: Low Risk  (05/20/2023)     Distress Screen completed: No     No data to display            Family/Social Information:  Housing Arrangement: patient lives alone Family members/support persons in your life? Pt's son resides about 14 miles away, but works full time and can only offer limited support.  Pt strongly values her independence and does not like to ask for assistance  unless it is absolutely necessary. Transportation concerns: no  Employment: Retired prior to retirement pt worked at [employer].  Income source: Actor concerns: Yes, current concerns Type of concern: Utilities and Rent/ mortgage Food access concerns: no Religious or spiritual practice: Data processing manager Currently in place:  Medicaid and food stamps  Coping/ Adjustment to diagnosis: Patient understands treatment plan and what happens next? yes Concerns about diagnosis and/or treatment: Quality of life Patient reported stressors: Finances and Physical issues Hopes and/or priorities: pt's priority is to have her iron infusion to address anemia w/ the hopes of being able to breath easier and not feeling so fatigued Patient enjoys  not addressed Current coping skills/ strengths: Capable of independent living , Motivation for treatment/growth , and Physical Health     SUMMARY: Current SDOH Barriers:  Financial constraints related to fixed income and Limited social support  Clinical Social Work Clinical Goal(s):  Explore community resource options for unmet needs related to:  Financial Strain   Interventions: Discussed common feeling and emotions when being diagnosed with cancer, and the importance of support during treatment Informed patient of the support team roles and support services at [facility] Provided CSW contact information and encouraged patient to call with any questions or concerns Encouraged pt to contact [employer] to inquire if she may be eligible for additional services.  Pt may qualify for some food delivery.  Encouraged pt to check with her Medicaid case manager to apply for PCS services if she is eligible.     Follow Up Plan: Patient will contact CSW with any support or resource needs Patient verbalizes understanding of plan: Yes    Rachel Moulds, LCSW Clinical Social Worker [facility]  Patient is  participating in a Managed Medicaid Plan:  Yes

## 2023-05-31 ENCOUNTER — Inpatient Hospital Stay: Payer: Medicare HMO

## 2023-05-31 VITALS — BP 141/74 | HR 97 | Temp 97.4°F | Resp 18

## 2023-05-31 DIAGNOSIS — D509 Iron deficiency anemia, unspecified: Secondary | ICD-10-CM

## 2023-05-31 DIAGNOSIS — Z803 Family history of malignant neoplasm of breast: Secondary | ICD-10-CM | POA: Diagnosis not present

## 2023-05-31 MED ORDER — CETIRIZINE HCL 10 MG/ML IV SOLN
10.0000 mg | Freq: Once | INTRAVENOUS | Status: AC
  Administered 2023-05-31: 10 mg via INTRAVENOUS
  Filled 2023-05-31: qty 1

## 2023-05-31 MED ORDER — SODIUM CHLORIDE 0.9 % IV SOLN
950.0000 mg | Freq: Once | INTRAVENOUS | Status: AC
  Administered 2023-05-31: 950 mg via INTRAVENOUS
  Filled 2023-05-31: qty 19

## 2023-05-31 MED ORDER — SODIUM CHLORIDE 0.9 % IV SOLN: Freq: Once | INTRAVENOUS | Status: AC

## 2023-05-31 MED ORDER — DIPHENHYDRAMINE HCL 25 MG PO CAPS: 50.0000 mg | ORAL_CAPSULE | Freq: Once | ORAL | Status: DC

## 2023-05-31 MED ORDER — FAMOTIDINE IN NACL 20-0.9 MG/50ML-% IV SOLN
20.0000 mg | Freq: Once | INTRAVENOUS | Status: AC
  Administered 2023-05-31: 20 mg via INTRAVENOUS
  Filled 2023-05-31: qty 50

## 2023-05-31 MED ORDER — SODIUM CHLORIDE 0.9 % IV SOLN
50.0000 mg | Freq: Once | INTRAVENOUS | Status: AC
  Administered 2023-05-31: 50 mg via INTRAVENOUS
  Filled 2023-05-31: qty 1

## 2023-05-31 MED ORDER — METHYLPREDNISOLONE SODIUM SUCC 125 MG IJ SOLR
125.0000 mg | Freq: Once | INTRAMUSCULAR | Status: AC
  Administered 2023-05-31: 125 mg via INTRAVENOUS
  Filled 2023-05-31: qty 2

## 2023-05-31 MED ORDER — ACETAMINOPHEN 325 MG PO TABS
650.0000 mg | ORAL_TABLET | Freq: Once | ORAL | Status: AC
  Administered 2023-05-31: 650 mg via ORAL
  Filled 2023-05-31: qty 2

## 2023-05-31 NOTE — Patient Instructions (Signed)
[facility]  Discharge Instructions: Thank you for choosing [facility] to provide your oncology and hematology care.  If you have a lab appointment with the Cancer Center - please note that after April 8th, 2024, all labs will be drawn in the cancer center.  You do not have to check in or register with the main entrance as you have in the past but will complete your check-in in the cancer center.  Wear comfortable clothing and clothing appropriate for easy access to any Portacath or PICC line.   We strive to give you quality time with your provider. You may need to reschedule your appointment if you arrive late (15 or more minutes).  Arriving late affects you and other patients whose appointments are after yours.  Also, if you miss three or more appointments without notifying the office, you may be dismissed from the clinic at the provider's discretion.      For prescription refill requests, have your pharmacy contact our office and allow 72 hours for refills to be completed.    Today you received the following: Infed.  Iron Dextran Injection What is this medication? IRON DEXTRAN (EYE ern DEX tran) treats low levels of iron in your body. Iron is a mineral that plays an important role in making red blood cells, which carry oxygen from your lungs to the rest of your body. This medicine may be used for other purposes; ask your health care provider or pharmacist if you have questions. COMMON BRAND NAME(S): Dexferrum, INFeD What should I tell my care team before I take this medication? They need to know if you have any of these conditions: Anemia not caused by low iron levels Heart disease High levels of iron in the blood Kidney disease Liver disease An unusual or allergic reaction to iron, other medications, foods, dyes, or preservatives Pregnant or trying to get pregnant Breastfeeding How should I use this medication? This medication is for injection into  a vein or a muscle. It is given in a hospital or clinic setting. Talk to your care team about the use of this medication in children. While this medication may be prescribed for children as young as 64 months old for selected conditions, precautions do apply. Overdosage: If you think you have taken too much of this medicine contact a poison control center or emergency room at once. NOTE: This medicine is only for you. Do not share this medicine with others. What if I miss a dose? Keep appointments for follow-up doses. It is important not to miss your dose. Call your care team if you are unable to keep an appointment. What may interact with this medication? Do not take this medication with any of the following: Deferoxamine Dimercaprol Other iron products This medication may also interact with the following: Chloramphenicol Deferasirox This list may not describe all possible interactions. Give your health care provider a list of all the medicines, herbs, non-prescription drugs, or dietary supplements you use. Also tell them if you smoke, drink alcohol, or use illegal drugs. Some items may interact with your medicine. What should I watch for while using this medication? Visit your care team for regular checks on your progress. Tell your care team if your symptoms do not start to get better or if they get worse. You may need blood work while taking this medication. You may need to eat more foods that contain iron. Talk to your care team. Foods that contain iron include whole grains/cereals,  dried fruits, beans, peas, leafy green vegetables, and organ meats (liver, kidney). Long-term use of this medication may increase your risk of some cancers. Talk to your care team about your risk of cancer. What side effects may I notice from receiving this medication? Side effects that you should report to your care team as soon as possible: Allergic reactions--skin rash, itching, hives, swelling of the face,  lips, tongue, or throat Low blood pressure--dizziness, feeling faint or lightheaded, blurry vision Shortness of breath Side effects that usually do not require medical attention (report to your care team if they continue or are bothersome): Flushing Headache Joint pain Muscle pain Nausea Pain, redness, or irritation at injection site This list may not describe all possible side effects. Call your doctor for medical advice about side effects. You may report side effects to FDA at 1-800-FDA-1088. Where should I keep my medication? This medication is given in a hospital or clinic. It will not be stored at home. NOTE: This sheet is a summary. It may not cover all possible information. If you have questions about this medicine, talk to your doctor, pharmacist, or health care provider.  2024 Elsevier/Gold Standard (2022-04-01 00:00:00)       To help prevent nausea and vomiting after your treatment, we encourage you to take your nausea medication as directed.  BELOW ARE SYMPTOMS THAT SHOULD BE REPORTED IMMEDIATELY: *FEVER GREATER THAN 100.4 F (38 C) OR HIGHER *CHILLS OR SWEATING *NAUSEA AND VOMITING THAT IS NOT CONTROLLED WITH YOUR NAUSEA MEDICATION *UNUSUAL SHORTNESS OF BREATH *UNUSUAL BRUISING OR BLEEDING *URINARY PROBLEMS (pain or burning when urinating, or frequent urination) *BOWEL PROBLEMS (unusual diarrhea, constipation, pain near the anus) TENDERNESS IN MOUTH AND THROAT WITH OR WITHOUT PRESENCE OF ULCERS (sore throat, sores in mouth, or a toothache) UNUSUAL RASH, SWELLING OR PAIN  UNUSUAL VAGINAL DISCHARGE OR ITCHING   Items with * indicate a potential emergency and should be followed up as soon as possible or go to the Emergency Department if any problems should occur.  Please show the CHEMOTHERAPY ALERT CARD or IMMUNOTHERAPY ALERT CARD at check-in to the Emergency Department and triage nurse.  Should you have questions after your visit or need to cancel or reschedule your  appointment, please contact [facility] [phone]  and follow the prompts.  Office hours are 8:00 a.m. to 4:30 p.m. Monday - Friday. Please note that voicemails left after 4:00 p.m. may not be returned until the following business day.  We are closed weekends and major holidays. You have access to a nurse at all times for urgent questions. Please call the main number to the clinic [phone] and follow the prompts.  For any non-urgent questions, you may also contact your provider using MyChart. We now offer e-Visits for anyone 50 and older to request care online for non-urgent symptoms. For details visit [email or web address].   Also download the MyChart app! Go to the app store, search "MyChart", open the app, select [place], and log in with your MyChart username and password.

## 2023-05-31 NOTE — Progress Notes (Signed)
Patient presents today for iron infusion.  Patient is in satisfactory condition with no new complaints voiced.  Vital signs are stable.  IV placed in L wrist.  IV flushed well with good blood return noted.  We will proceed with infusion per provider orders.    Patient tolerated infusion well with no complaints voiced.  Patient left ambulatory in stable condition.  Vital signs stable at discharge.  Follow up as scheduled.

## 2023-06-08 ENCOUNTER — Telehealth: Payer: Self-pay | Admitting: *Deleted

## 2023-06-08 NOTE — Telephone Encounter (Signed)
Patient called to advise that she is still extremely short of breath and feeling unwell in general.  She had Iron infusion on 8/26.  Advised to consult with PCP or ER for follow up.  Verbalized understanding.

## 2023-06-09 DIAGNOSIS — Z6824 Body mass index (BMI) 24.0-24.9, adult: Secondary | ICD-10-CM | POA: Diagnosis not present

## 2023-06-09 DIAGNOSIS — G894 Chronic pain syndrome: Secondary | ICD-10-CM | POA: Diagnosis not present

## 2023-06-09 DIAGNOSIS — I10 Essential (primary) hypertension: Secondary | ICD-10-CM | POA: Diagnosis not present

## 2023-06-09 DIAGNOSIS — J042 Acute laryngotracheitis: Secondary | ICD-10-CM | POA: Diagnosis not present

## 2023-06-09 DIAGNOSIS — J0100 Acute maxillary sinusitis, unspecified: Secondary | ICD-10-CM | POA: Diagnosis not present

## 2023-06-09 DIAGNOSIS — J45901 Unspecified asthma with (acute) exacerbation: Secondary | ICD-10-CM | POA: Diagnosis not present

## 2023-06-30 DIAGNOSIS — D649 Anemia, unspecified: Secondary | ICD-10-CM | POA: Diagnosis not present

## 2023-07-06 ENCOUNTER — Encounter: Payer: Self-pay | Admitting: Gastroenterology

## 2023-07-09 ENCOUNTER — Inpatient Hospital Stay: Payer: Medicare HMO | Attending: Hematology

## 2023-07-09 DIAGNOSIS — D509 Iron deficiency anemia, unspecified: Secondary | ICD-10-CM | POA: Insufficient documentation

## 2023-07-09 LAB — CBC
HCT: 39.2 % (ref 36.0–46.0)
Hemoglobin: 12.9 g/dL (ref 12.0–15.0)
MCH: 27.5 pg (ref 26.0–34.0)
MCHC: 32.9 g/dL (ref 30.0–36.0)
MCV: 83.6 fL (ref 80.0–100.0)
Platelets: 185 10*3/uL (ref 150–400)
RBC: 4.69 MIL/uL (ref 3.87–5.11)
RDW: 23.5 % — ABNORMAL HIGH (ref 11.5–15.5)
WBC: 8.1 10*3/uL (ref 4.0–10.5)
nRBC: 0 % (ref 0.0–0.2)

## 2023-07-09 LAB — IRON AND TIBC
Iron: 84 ug/dL (ref 28–170)
Saturation Ratios: 25 % (ref 10.4–31.8)
TIBC: 335 ug/dL (ref 250–450)
UIBC: 251 ug/dL

## 2023-07-09 LAB — FERRITIN: Ferritin: 137 ng/mL (ref 11–307)

## 2023-07-15 ENCOUNTER — Inpatient Hospital Stay: Payer: Medicare HMO | Admitting: Oncology

## 2023-07-15 VITALS — BP 116/89 | HR 88 | Temp 98.1°F | Resp 18 | Ht 62.0 in | Wt 140.2 lb

## 2023-07-15 DIAGNOSIS — D509 Iron deficiency anemia, unspecified: Secondary | ICD-10-CM | POA: Diagnosis not present

## 2023-07-15 DIAGNOSIS — R0602 Shortness of breath: Secondary | ICD-10-CM | POA: Diagnosis not present
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
## 2023-09-06 NOTE — Progress Notes (Unsigned)
  Cardiology Office Note:   Date:  09/06/2023  ID:  Sarah [name], DOB 1951-04-20, MRN [id number] PCP: Assunta Found, MD  [facility] Providers Cardiologist:  None {  History of Present Illness:   Sarah [name] is a 72 y.o. female for evaluation anemia.  ***    ROS: ***  Studies Reviewed:    EKG:       ***  Risk Assessment/Calculations:   {Does this patient have ATRIAL FIBRILLATION?:[phone]} No BP recorded.  {Refresh Note OR Click here to enter BP  :1}***        Physical Exam:   VS:  There were no vitals taken for this visit.   Wt Readings from Last 3 Encounters:  07/15/23 140 lb 3.2 oz (63.6 kg)  05/20/23 140 lb 9.6 oz (63.8 kg)  03/29/23 138 lb (62.6 kg)     GEN: Well nourished, well developed in no acute distress NECK: No JVD; No carotid bruits CARDIAC: ***RR, *** murmurs, rubs, gallops RESPIRATORY:  Clear to auscultation without rales, wheezing or rhonchi  ABDOMEN: Soft, non-tender, non-distended EXTREMITIES:  No edema; No deformity   ASSESSMENT AND PLAN:   Anemia: ***     Follow up ***  Signed, Rollene Rotunda, MD

## 2023-09-09 ENCOUNTER — Ambulatory Visit: Payer: Medicare HMO | Attending: Cardiology | Admitting: Cardiology

## 2023-09-09 ENCOUNTER — Encounter: Payer: Self-pay | Admitting: Cardiology

## 2023-09-09 VITALS — BP 108/74 | HR 107 | Ht 62.0 in | Wt 134.0 lb

## 2023-09-09 DIAGNOSIS — R0600 Dyspnea, unspecified: Secondary | ICD-10-CM

## 2023-09-09 DIAGNOSIS — Z13220 Encounter for screening for lipoid disorders: Secondary | ICD-10-CM | POA: Diagnosis not present

## 2023-09-09 DIAGNOSIS — D509 Iron deficiency anemia, unspecified: Secondary | ICD-10-CM | POA: Diagnosis not present

## 2023-09-09 NOTE — Patient Instructions (Signed)
Medication Instructions:  No changes.  *If you need a refill on your cardiac medications before your next appointment, please call your pharmacy*   Lab Work: Fasting Lipid to be done the morning of the stress test.  If you have labs (blood work) drawn today and your tests are completely normal, you will receive your results only by: MyChart Message (if you have MyChart) OR A paper copy in the mail If you have any lab test that is abnormal or we need to change your treatment, we will call you to review the results.   Testing/Procedures: Will schedule be at [address] Your physician has requested that you have an exercise tolerance test.. An exercise tolerance test is a test to check how your heart works during exercise. You will need to walk on a treadmill or for this test. An electrocardiogram (ECG) will record your heartbeat when you are at rest and when you are exercising.  Please also follow instruction sheet, as given.   Do not drink or eat foods with caffeine for 24 hours before the test. (Chocolate, coffee, tea, or energy drinks) If you use an inhaler, bring it with you to the test. Do not smoke for 4 hours before the test. Wear comfortable shoes and clothing.     Follow-Up: At [facility], you and your health needs are our priority.  As part of our continuing mission to provide you with exceptional heart care, we have created designated Provider Care Teams.  These Care Teams include your primary Cardiologist (physician) and Advanced Practice Providers (APPs -  Physician Assistants and Nurse Practitioners) who all work together to provide you with the care you need, when you need it.  We recommend signing up for the patient portal called "MyChart".  Sign up information is provided on this After Visit Summary.  MyChart is used to connect with patients for Virtual Visits (Telemedicine).  Patients are able to view lab/test results, encounter notes,  upcoming appointments, etc.  Non-urgent messages can be sent to your provider as well.   To learn more about what you can do with MyChart, go to [email or web address].    Your next appointment:   As needed.    Provider:   Rollene Rotunda, MD     Other Instructions Have Lipid panel drawn in the morning of the exercise stress test.

## 2023-09-30 ENCOUNTER — Emergency Department (HOSPITAL_COMMUNITY): Payer: Medicare HMO

## 2023-09-30 ENCOUNTER — Observation Stay (HOSPITAL_COMMUNITY)
Admission: EM | Admit: 2023-09-30 | Discharge: 2023-10-03 | Disposition: A | Discharge status: Home / Self Care | Payer: Medicare HMO | Attending: Emergency Medicine | Admitting: Emergency Medicine

## 2023-09-30 ENCOUNTER — Other Ambulatory Visit: Payer: Self-pay

## 2023-09-30 ENCOUNTER — Ambulatory Visit: Payer: Medicare HMO | Attending: Cardiology

## 2023-09-30 ENCOUNTER — Encounter (HOSPITAL_COMMUNITY): Payer: Self-pay

## 2023-09-30 DIAGNOSIS — R471 Dysarthria and anarthria: Secondary | ICD-10-CM | POA: Diagnosis present

## 2023-09-30 DIAGNOSIS — R000 Tachycardia, unspecified: Secondary | ICD-10-CM | POA: Diagnosis not present

## 2023-09-30 DIAGNOSIS — Z79899 Other long term (current) drug therapy: Secondary | ICD-10-CM | POA: Insufficient documentation

## 2023-09-30 DIAGNOSIS — R0600 Dyspnea, unspecified: Secondary | ICD-10-CM

## 2023-09-30 DIAGNOSIS — E872 Acidosis, unspecified: Secondary | ICD-10-CM | POA: Insufficient documentation

## 2023-09-30 DIAGNOSIS — R58 Hemorrhage, not elsewhere classified: Secondary | ICD-10-CM | POA: Diagnosis not present

## 2023-09-30 DIAGNOSIS — K922 Gastrointestinal hemorrhage, unspecified: Secondary | ICD-10-CM | POA: Diagnosis not present

## 2023-09-30 DIAGNOSIS — F419 Anxiety disorder, unspecified: Secondary | ICD-10-CM | POA: Diagnosis present

## 2023-09-30 DIAGNOSIS — G459 Transient cerebral ischemic attack, unspecified: Secondary | ICD-10-CM | POA: Diagnosis not present

## 2023-09-30 DIAGNOSIS — E039 Hypothyroidism, unspecified: Secondary | ICD-10-CM | POA: Diagnosis present

## 2023-09-30 DIAGNOSIS — Z743 Need for continuous supervision: Secondary | ICD-10-CM | POA: Diagnosis not present

## 2023-09-30 DIAGNOSIS — Z96641 Presence of right artificial hip joint: Secondary | ICD-10-CM | POA: Insufficient documentation

## 2023-09-30 DIAGNOSIS — I10 Essential (primary) hypertension: Secondary | ICD-10-CM | POA: Insufficient documentation

## 2023-09-30 DIAGNOSIS — Z8673 Personal history of transient ischemic attack (TIA), and cerebral infarction without residual deficits: Secondary | ICD-10-CM | POA: Insufficient documentation

## 2023-09-30 DIAGNOSIS — R531 Weakness: Secondary | ICD-10-CM

## 2023-09-30 DIAGNOSIS — K625 Hemorrhage of anus and rectum: Secondary | ICD-10-CM | POA: Diagnosis not present

## 2023-09-30 DIAGNOSIS — R29898 Other symptoms and signs involving the musculoskeletal system: Secondary | ICD-10-CM | POA: Diagnosis not present

## 2023-09-30 DIAGNOSIS — R0689 Other abnormalities of breathing: Secondary | ICD-10-CM | POA: Diagnosis not present

## 2023-09-30 DIAGNOSIS — E876 Hypokalemia: Secondary | ICD-10-CM

## 2023-09-30 DIAGNOSIS — R55 Syncope and collapse: Secondary | ICD-10-CM | POA: Diagnosis not present

## 2023-09-30 DIAGNOSIS — I672 Cerebral atherosclerosis: Secondary | ICD-10-CM | POA: Diagnosis not present

## 2023-09-30 DIAGNOSIS — R064 Hyperventilation: Secondary | ICD-10-CM | POA: Diagnosis not present

## 2023-09-30 LAB — COMPREHENSIVE METABOLIC PANEL
ALT: 19 U/L (ref 0–44)
AST: 22 U/L (ref 15–41)
Albumin: 3.4 g/dL — ABNORMAL LOW (ref 3.5–5.0)
Alkaline Phosphatase: 57 U/L (ref 38–126)
Anion gap: 7 (ref 5–15)
BUN: 23 mg/dL (ref 8–23)
CO2: 20 mmol/L — ABNORMAL LOW (ref 22–32)
Calcium: 8.6 mg/dL — ABNORMAL LOW (ref 8.9–10.3)
Chloride: 110 mmol/L (ref 98–111)
Creatinine, Ser: 0.72 mg/dL (ref 0.44–1.00)
GFR, Estimated: >60 mL/min (ref >60)
Glucose, Bld: 171 mg/dL — ABNORMAL HIGH (ref 70–99)
Potassium: 3 mmol/L — ABNORMAL LOW (ref 3.5–5.1)
Sodium: 137 mmol/L (ref 135–145)
Total Bilirubin: 0.4 mg/dL (ref <1.2)
Total Protein: 6 g/dL — ABNORMAL LOW (ref 6.5–8.1)

## 2023-09-30 LAB — DIFFERENTIAL
Abs Immature Granulocytes: 0.04 10*3/uL (ref 0.00–0.07)
Basophils Absolute: 0 10*3/uL (ref 0.0–0.1)
Basophils Relative: 0 %
Eosinophils Absolute: 0.1 10*3/uL (ref 0.0–0.5)
Eosinophils Relative: 1 %
Immature Granulocytes: 0 %
Lymphocytes Relative: 22 %
Lymphs Abs: 2.4 10*3/uL (ref 0.7–4.0)
Monocytes Absolute: 0.5 10*3/uL (ref 0.1–1.0)
Monocytes Relative: 4 %
Neutro Abs: 8 10*3/uL — ABNORMAL HIGH (ref 1.7–7.7)
Neutrophils Relative %: 73 %

## 2023-09-30 LAB — RAPID URINE DRUG SCREEN, HOSP PERFORMED
Amphetamines: NOT DETECTED
Barbiturates: NOT DETECTED
Benzodiazepines: POSITIVE — AB
Cocaine: NOT DETECTED
Opiates: NOT DETECTED
Tetrahydrocannabinol: NOT DETECTED

## 2023-09-30 LAB — EXERCISE TOLERANCE TEST
Angina Index: 1
Duke Treadmill Score: 2
Estimated workload: 7
Exercise duration (min): 6 min
Exercise duration (sec): 1 s
MPHR: 150 {beats}/min
Peak HR: 150 {beats}/min
Percent HR: 100 %
RPE: 18
Rest BP: 132.86 mm[Hg]
Rest HR: 74 {beats}/min
ST Depression (mm): 0 mm

## 2023-09-30 LAB — URINALYSIS, ROUTINE W REFLEX MICROSCOPIC
Bacteria, UA: NONE SEEN
Bilirubin Urine: NEGATIVE
Glucose, UA: NEGATIVE mg/dL
Ketones, ur: 5 mg/dL — AB
Leukocytes,Ua: NEGATIVE
Nitrite: NEGATIVE
Protein, ur: NEGATIVE mg/dL
Specific Gravity, Urine: 1.024 (ref 1.005–1.030)
pH: 6 (ref 5.0–8.0)

## 2023-09-30 LAB — CBC
HCT: 30.5 % — ABNORMAL LOW (ref 36.0–46.0)
Hemoglobin: 11 g/dL — ABNORMAL LOW (ref 12.0–15.0)
MCH: 32.2 pg (ref 26.0–34.0)
MCHC: 36.1 g/dL — ABNORMAL HIGH (ref 30.0–36.0)
MCV: 89.2 fL (ref 80.0–100.0)
Platelets: 251 10*3/uL (ref 150–400)
RBC: 3.42 MIL/uL — ABNORMAL LOW (ref 3.87–5.11)
RDW: 13.2 % (ref 11.5–15.5)
WBC: 11.1 10*3/uL — ABNORMAL HIGH (ref 4.0–10.5)
nRBC: 0 % (ref 0.0–0.2)

## 2023-09-30 LAB — APTT: aPTT: 23 s — ABNORMAL LOW (ref 24–36)

## 2023-09-30 LAB — PROTIME-INR
INR: 1 (ref 0.8–1.2)
Prothrombin Time: 13.6 s (ref 11.4–15.2)

## 2023-09-30 LAB — LIPID PANEL
Chol/HDL Ratio: 4.1 {ratio} (ref 0.0–4.4)
Cholesterol, Total: 199 mg/dL (ref 100–199)
HDL: 48 mg/dL (ref >39)
LDL Chol Calc (NIH): 137 mg/dL — ABNORMAL HIGH (ref 0–99)
Triglycerides: 77 mg/dL (ref 0–149)
VLDL Cholesterol Cal: 14 mg/dL (ref 5–40)

## 2023-09-30 LAB — ETHANOL: Alcohol, Ethyl (B): <10 mg/dL (ref <10)

## 2023-09-30 MED ORDER — POTASSIUM CHLORIDE 10 MEQ/100ML IV SOLN
10.0000 meq | Freq: Once | INTRAVENOUS | Status: AC
  Administered 2023-09-30: 10 meq via INTRAVENOUS
  Filled 2023-09-30: qty 100

## 2023-09-30 MED ORDER — IOHEXOL 350 MG/ML SOLN
100.0000 mL | Freq: Once | INTRAVENOUS | Status: AC | PRN
  Administered 2023-09-30: 100 mL via INTRAVENOUS

## 2023-09-30 MED ORDER — SODIUM CHLORIDE 0.9 % IV BOLUS
1000.0000 mL | Freq: Once | INTRAVENOUS | Status: AC
Start: 2023-09-30 — End: 2023-09-30
  Administered 2023-09-30: 1000 mL via INTRAVENOUS

## 2023-09-30 NOTE — ED Triage Notes (Signed)
PT BIB [facility] from home for rectal bleeding, has had 12 episodes with syncopal episode at home.  24g in left forearm & got about of NS by EMS HR 105-110 Pt states she hit her head when she passed out on the toilet. Has skin tear to right elbow dressed with guaze PTA by EMS & laceration under left eye. Pt not on thinners.

## 2023-09-30 NOTE — ED Notes (Signed)
Neuro stroke on monitor for assessment.

## 2023-09-30 NOTE — ED Notes (Signed)
Called for code stroke at 8:42 PM
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
## 2023-09-30 NOTE — ED Provider Notes (Signed)
  Physical Exam  BP 105/71   Pulse 98   Temp (!) 97.3 F (36.3 C) (Axillary)   Resp 17   Ht 5\' 2"  (1.575 m)   Wt 60.8 kg   SpO2 100%   BMI 24.51 kg/m   Physical Exam  Procedures  Procedures  ED Course / MDM   Clinical Course as of 09/30/23 2232  Thu Sep 30, 2023  2139 [place] CVA or basilar artery stroke per teleneurology [CG]  2139 Negative CT head without contrast. Will proceed to CTA head neck. [CG]    Clinical Course User Index [CG] Al Decant, PA-C   Medical Decision Making Amount and/or Complexity of Data Reviewed Labs: ordered. Radiology: ordered.  Risk Prescription drug management.   Presents for gi bleeding Then became of code stroke with slurred speech, hemiparesis, pronator (left) CT Head normal Per neuro, CTA head and neck - pontine vs basilar artery stroke pending Not anticoagulated  If neg, re-consult teleneurology Admission felt likely  23:39: recheck - patient feeling improved from previous episode described as slurred speech, confusion, hemiparesis.   Per family at bedside, patient has returned to her baseline. No pronator drift to my exam. No lateralizing weakness.   Teleneurology re-paged for further consult.   Admit for TIA work up. Will admit to hospitalist service, transfer to [place]. Patient and family updated.  Regarding rectal bleeding: she is hemodynamically stable. Pending office follow up with GI Bosie Clos) - may request consult while inpatient. Chart reviewed - last colonoscopy was 03/2023 for rectal bleeding - internal hemorrhoids, polyps.          Elpidio Anis, PA-C 10/01/23 0041

## 2023-09-30 NOTE — Consult Note (Addendum)
[facility] Consult Services   Patient Name:   Sarah [name], Sarah [name] Date of Birth:   02/12/1951 Identification Number:   MRN - [id number] Date of Service:   09/30/2023 20:54:08  Diagnosis:       R55 - Syncope (blackout, fainting, vasovagal attack)  Impression:      The patient has a h/o GI bleed and TIA, presents with syncope in the setting of GI hemorrhage, and on exam has weakness and facial droop. On my exam she has generalized drift in all extremities but worse on the L side, while she has a facial droop on the R side and dysarthria. These crossed findings suggest possible pontomedullary infarct vs Bilateral watershed infarcts involving the cerebral hemispheres. She is not a candidate for IV thrombolysis or antiplatelets due to GI bleeding. CTA and CTP are pending to rule out LVO. Otherwise supportive care to avoid hypotension (and brain hypoperfusion are recommended) along with further stroke workup.  Our recommendations are outlined below.  Recommendations:        Stroke/Telemetry Floor       Neuro Checks       Bedside Swallow Eval       DVT Prophylaxis       IV Fluids, Normal Saline       Head of Bed 30 Degrees       Euglycemia and Avoid Hyperthermia (PRN Acetaminophen)       Antihypertensives PRN if Blood pressure is greater than 220/120 or there is a concern for End organ damage/contraindications for permissive HTN. If blood pressure is greater than 220/120 give labetalol PO or IV or Vasotec IV with a goal of 15% reduction in BP during the first 24 hours.       avoid hypotension (keep systolic BP >100)       CTA and CTP negative for LVO.       plan to admit for further stroke workup.  Sign Out:       Discussed with Emergency Department Provider    ------------------------------------------------------------------------------  Advanced Imaging: Advanced imaging has been ordered. Results pending. ADDENDUM:  Advanced Imaging: CTA Head and  Neck Completed.  CT perfusion Completed.  LVO:No  Patient is not eligible for NIR consideration. Proceed with plan as discussed.  Metrics: Last Known Well: 09/30/2023 16:30:00 Dispatch Time: 09/30/2023 20:54:08 Arrival Time: 09/30/2023 19:58:00 Initial Response Time: 09/30/2023 21:05:01 Symptoms: syncope. Initial patient interaction: 09/30/2023 21:08:40 NIHSS Assessment Completed: 09/30/2023 21:12:27 Patient is not a candidate for Thrombolytic. Thrombolytic Medical Decision: 09/30/2023 21:15:49 Patient was not deemed candidate for Thrombolytic because of following reasons: LKW outside 4.5 hr window. . Recent gastrointestinal or urinary tract hemorrhage (within previous 21 days) .  CT head showed no acute hemorrhage or acute core infarct.  Primary Provider Notified of Diagnostic Impression and Management Plan on: 09/30/2023 21:27:16    ------------------------------------------------------------------------------  History of Present Illness: Patient is a 72 year old Female.  Patient was brought by EMS for symptoms of syncope. LSN was at christmas dinner per the family. At 4:30pm she said she was still fine except for hemorrhaging. She was sitting on the commode and next then she knew she felt funny and blacked out. At 6:50pm she called her daughter and said she had passed out, hit her head and had a laceration on her forehead. She has a h/o rectal bleeding with colon polyps. Today she has been having bloody bowel movements. She had some weakness, worse on the L side but has improved. Also has some slurred  speech.   Past Medical History:      Hypertension Other PMH:  GI bleed  TIA  Medications:  No Anticoagulant use  No Antiplatelet use Reviewed EMR for current medications  Allergies:  Reviewed  Social History: Unable To Obtain Due To Patient Status : Patient Cannot Communicate Relevant Social History  Family History:  There is no family history of premature  cerebrovascular disease pertinent to this consultation  ROS : 14 Points Review of Systems was performed and was negative except mentioned in HPI.  Past Surgical History: There Is No Surgical History Contributory To Today's Visit    Examination: BP(113/84), Pulse(104), 1A: Level of Consciousness - Alert; keenly responsive + 0 1B: Ask Month and Age - Both Questions Right + 0 1C: Blink Eyes & Squeeze Hands - Performs Both Tasks + 0 2: Test Horizontal Extraocular Movements - Normal + 0 3: Test Visual Fields - No Visual Loss + 0 4: Test Facial Palsy (Use Grimace if Obtunded) - Partial paralysis (lower face) + 2 5A: Test Left Arm Motor Drift - Drift, but doesn't hit bed + 1 5B: Test Right Arm Motor Drift - Drift, but doesn't hit bed + 1 6A: Test Left Leg Motor Drift - Drift, but doesn't hit bed + 1 6B: Test Right Leg Motor Drift - Drift, but doesn't hit bed + 1 7: Test Limb Ataxia (FNF/Heel-Shin) - No Ataxia + 0 8: Test Sensation - Mild-Moderate Loss: Less Sharp/More Dull + 1 9: Test Language/Aphasia - Normal; No aphasia + 0 10: Test Dysarthria - Mild-Moderate Dysarthria: Slurring but can be understood + 1 11: Test Extinction/Inattention - No abnormality + 0  NIHSS Score: 8  NIHSS Free Text : R facial droop  L drift is worse than R  L sensory deficit of face and leg  Pre-Morbid Modified Rankin Scale: 1 Points = No significant disability despite symptoms; able to carry out all usual duties and activities  Spoke with : Dr Juanita Craver  This consult was conducted in real time using interactive audio and Immunologist. Patient was informed of the technology being used for this visit and agreed to proceed. Patient located in hospital and provider located at home/office setting.   Patient is being evaluated for possible acute neurologic impairment and high probability of imminent or life-threatening deterioration. I spent total of 35 minutes providing care to this patient, including time for  face to face visit via telemedicine, review of medical records, imaging studies and discussion of findings with providers, the patient and/or family.   Dr Lynnda Child   [facility] For Inpatient follow-up with [facility] physician please call [facility] at [phone]. As we are not an outpatient service for any post hospital discharge needs please contact the hospital for assistance. If you have any questions for the [facility] physicians or need to reconsult for clinical or diagnostic changes please contact us via [facility] at [phone].

## 2023-09-30 NOTE — ED Notes (Addendum)
While starting line on pt while talking she began to not be able to find her words as often, had left sided weakness in arm & leg. EDP notified.

## 2023-09-30 NOTE — Progress Notes (Addendum)
2042 elert  LKW 1630, left sided deficits and rectal bleeding. Syncopal episode.  1956 errival EMS 2040 MD assigned  2045 down to ct 2054 paged to TS, back from ct 2105 [facility]SMD on screen, NCCT relayed  2135 back down to CT 2148 back from advanced imaging 2221 advanced read relayed to Dr. Imogene Burn

## 2023-09-30 NOTE — ED Notes (Signed)
EDP at bedside  

## 2023-10-01 ENCOUNTER — Observation Stay (HOSPITAL_COMMUNITY): Payer: Medicare HMO

## 2023-10-01 ENCOUNTER — Observation Stay (HOSPITAL_BASED_OUTPATIENT_CLINIC_OR_DEPARTMENT_OTHER): Payer: Medicare HMO

## 2023-10-01 ENCOUNTER — Encounter (HOSPITAL_COMMUNITY): Payer: Self-pay | Admitting: Internal Medicine

## 2023-10-01 DIAGNOSIS — I10 Essential (primary) hypertension: Secondary | ICD-10-CM

## 2023-10-01 DIAGNOSIS — R1084 Generalized abdominal pain: Secondary | ICD-10-CM | POA: Diagnosis not present

## 2023-10-01 DIAGNOSIS — K922 Gastrointestinal hemorrhage, unspecified: Secondary | ICD-10-CM | POA: Diagnosis not present

## 2023-10-01 DIAGNOSIS — R4020 Unspecified coma: Secondary | ICD-10-CM | POA: Diagnosis not present

## 2023-10-01 DIAGNOSIS — G459 Transient cerebral ischemic attack, unspecified: Secondary | ICD-10-CM | POA: Diagnosis not present

## 2023-10-01 DIAGNOSIS — E039 Hypothyroidism, unspecified: Secondary | ICD-10-CM | POA: Diagnosis not present

## 2023-10-01 DIAGNOSIS — E876 Hypokalemia: Secondary | ICD-10-CM

## 2023-10-01 DIAGNOSIS — G319 Degenerative disease of nervous system, unspecified: Secondary | ICD-10-CM | POA: Diagnosis not present

## 2023-10-01 DIAGNOSIS — R531 Weakness: Secondary | ICD-10-CM

## 2023-10-01 DIAGNOSIS — R471 Dysarthria and anarthria: Secondary | ICD-10-CM

## 2023-10-01 DIAGNOSIS — F419 Anxiety disorder, unspecified: Secondary | ICD-10-CM | POA: Diagnosis not present

## 2023-10-01 DIAGNOSIS — K625 Hemorrhage of anus and rectum: Secondary | ICD-10-CM | POA: Diagnosis not present

## 2023-10-01 LAB — CBC WITH DIFFERENTIAL/PLATELET
Abs Immature Granulocytes: 0.04 10*3/uL (ref 0.00–0.07)
Basophils Absolute: 0 10*3/uL (ref 0.0–0.1)
Basophils Relative: 0 %
Eosinophils Absolute: 0 10*3/uL (ref 0.0–0.5)
Eosinophils Relative: 0 %
HCT: 28.4 % — ABNORMAL LOW (ref 36.0–46.0)
Hemoglobin: 10.3 g/dL — ABNORMAL LOW (ref 12.0–15.0)
Immature Granulocytes: 1 %
Lymphocytes Relative: 30 %
Lymphs Abs: 2.6 10*3/uL (ref 0.7–4.0)
MCH: 32.6 pg (ref 26.0–34.0)
MCHC: 36.3 g/dL — ABNORMAL HIGH (ref 30.0–36.0)
MCV: 89.9 fL (ref 80.0–100.0)
Monocytes Absolute: 0.5 10*3/uL (ref 0.1–1.0)
Monocytes Relative: 6 %
Neutro Abs: 5.4 10*3/uL (ref 1.7–7.7)
Neutrophils Relative %: 63 %
Platelets: 223 10*3/uL (ref 150–400)
RBC: 3.16 MIL/uL — ABNORMAL LOW (ref 3.87–5.11)
RDW: 13.2 % (ref 11.5–15.5)
WBC: 8.5 10*3/uL (ref 4.0–10.5)
nRBC: 0 % (ref 0.0–0.2)

## 2023-10-01 LAB — ECHOCARDIOGRAM COMPLETE
AR max vel: 1.95 cm2
AV Area VTI: 2.12 cm2
AV Area mean vel: 1.75 cm2
AV Mean grad: 3 mm[Hg]
AV Peak grad: 5.7 mm[Hg]
Ao pk vel: 1.19 m/s
Area-P 1/2: 5.88 cm2
Calc EF: 53.4 %
Height: 62 in
S' Lateral: 2.9 cm
Single Plane A2C EF: 47.5 %
Single Plane A4C EF: 54.2 %
Weight: 2144 [oz_av]

## 2023-10-01 LAB — TYPE AND SCREEN
ABO/RH(D): A POS
Antibody Screen: NEGATIVE

## 2023-10-01 LAB — COMPREHENSIVE METABOLIC PANEL
ALT: 20 U/L (ref 0–44)
AST: 23 U/L (ref 15–41)
Albumin: 3.6 g/dL (ref 3.5–5.0)
Alkaline Phosphatase: 55 U/L (ref 38–126)
Anion gap: 8 (ref 5–15)
BUN: 19 mg/dL (ref 8–23)
CO2: 19 mmol/L — ABNORMAL LOW (ref 22–32)
Calcium: 8.5 mg/dL — ABNORMAL LOW (ref 8.9–10.3)
Chloride: 112 mmol/L — ABNORMAL HIGH (ref 98–111)
Creatinine, Ser: 0.64 mg/dL (ref 0.44–1.00)
GFR, Estimated: >60 mL/min (ref >60)
Glucose, Bld: 121 mg/dL — ABNORMAL HIGH (ref 70–99)
Potassium: 3 mmol/L — ABNORMAL LOW (ref 3.5–5.1)
Sodium: 139 mmol/L (ref 135–145)
Total Bilirubin: 0.7 mg/dL (ref <1.2)
Total Protein: 6.2 g/dL — ABNORMAL LOW (ref 6.5–8.1)

## 2023-10-01 LAB — HIV ANTIBODY (ROUTINE TESTING W REFLEX): HIV Screen 4th Generation wRfx: NONREACTIVE

## 2023-10-01 LAB — LIPID PANEL
Cholesterol: 152 mg/dL (ref 0–200)
HDL: 39 mg/dL — ABNORMAL LOW (ref >40)
LDL Cholesterol: 100 mg/dL — ABNORMAL HIGH (ref 0–99)
Total CHOL/HDL Ratio: 3.9 {ratio}
Triglycerides: 66 mg/dL (ref <150)
VLDL: 13 mg/dL (ref 0–40)

## 2023-10-01 LAB — LACTIC ACID, PLASMA
Lactic Acid, Venous: 1.5 mmol/L (ref 0.5–1.9)
Lactic Acid, Venous: 1.1 mmol/L (ref 0.5–1.9)

## 2023-10-01 LAB — MAGNESIUM: Magnesium: 2.1 mg/dL (ref 1.7–2.4)

## 2023-10-01 MED ORDER — ALPRAZOLAM 0.5 MG PO TABS
0.5000 mg | ORAL_TABLET | Freq: Two times a day (BID) | ORAL | Status: DC | PRN
  Administered 2023-10-01 – 2023-10-02 (×3): 0.5 mg via ORAL
  Filled 2023-10-01 (×3): qty 1

## 2023-10-01 MED ORDER — ACETAMINOPHEN 160 MG/5ML PO SOLN: 650.0000 mg | ORAL | Status: DC | PRN

## 2023-10-01 MED ORDER — LEVOTHYROXINE SODIUM 50 MCG PO TABS
50.0000 ug | ORAL_TABLET | Freq: Every day | ORAL | Status: DC
  Administered 2023-10-01 – 2023-10-03 (×3): 50 ug via ORAL
  Filled 2023-10-01 (×3): qty 1

## 2023-10-01 MED ORDER — ACETAMINOPHEN 325 MG PO TABS
650.0000 mg | ORAL_TABLET | ORAL | Status: DC | PRN
  Administered 2023-10-01 (×2): 650 mg via ORAL
  Filled 2023-10-01 (×2): qty 2

## 2023-10-01 MED ORDER — STROKE: EARLY STAGES OF RECOVERY BOOK
Freq: Once | Status: AC
  Filled 2023-10-01: qty 1

## 2023-10-01 MED ORDER — ACETAMINOPHEN 650 MG RE SUPP: 650.0000 mg | RECTAL | Status: DC | PRN

## 2023-10-01 MED ORDER — TENAPANOR HCL 50 MG PO TABS
1.0000 | ORAL_TABLET | Freq: Two times a day (BID) | ORAL | Status: DC
  Filled 2023-10-01: qty 1

## 2023-10-01 MED ORDER — POTASSIUM CHLORIDE CRYS ER 20 MEQ PO TBCR
40.0000 meq | EXTENDED_RELEASE_TABLET | ORAL | Status: AC
  Administered 2023-10-01 (×2): 40 meq via ORAL
  Filled 2023-10-01 (×2): qty 2

## 2023-10-01 NOTE — Assessment & Plan Note (Signed)
-  hold home hydrochlorothiazide to allow for permissive HTN

## 2023-10-01 NOTE — Progress Notes (Addendum)
Patient admitted earlier this morning for dysarthria and rectal bleeding and currently waiting to be transferred to [facility] for MRI evaluation.  Patient seen and examined at bedside and plan of care discussed with her.  I have reviewed patient's medical records including this morning's H&P, current vitals, labs and medications myself.  MRI brain negative for acute stroke.  2D echo pending.  Labs pending this morning.  Monitor H&H.  Follow neurology and GI recommendations.  Addendum: Communicated with neurology/Dr. Roda Shutters via secure chat: Stroke workup is almost COMPLETE.  Patient's slurred speech has resolved.  He recommends starting baby aspirin once cleared by GI and Lipitor 20 mg on discharge.  No need for transfer to [facility]. Will cancel transfer to [facility].
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
## 2023-10-01 NOTE — Assessment & Plan Note (Signed)
-  continue synthroid 

## 2023-10-01 NOTE — Assessment & Plan Note (Signed)
-  pt initially presented with rectal bleeding but had an episode of witnessed dysarthria and difficult finding in the ED.  On exam with tele-neurology she was also noted to have left-sided deficit which has now resolved.  Patient appears very anxious on exam interestingly had an episode of aphasia in 2017 with negative neurological workup and thought to be psychosomatic as which may be the cause of her symptoms today. -However will need to transfer to [facility] for full TIA workup per neurology -CT head and CTA head and neck negative - obtain MRI brain - obtain echocardiogram  - Unable to start aspirin due to active GI bleed -Obtain A1c and lipids -PT/OT/SLT -Frequent neuro checks and keep on telemetry -Allow for permissive hypertension with blood pressure treatment as needed only if systolic goes above 220

## 2023-10-01 NOTE — Progress Notes (Signed)
PT Cancellation Note  Patient Details Name: Sarah [name] MRN: [id number] DOB: 1951-02-07   Cancelled Treatment:    Reason Eval/Treat Not Completed: Medical issues which prohibited therapy, Stroke workup- to transfer to [facility]. Blanchard Kelch PT Acute Rehabilitation Services Office [phone] Weekend [phone]    Rada Hay 10/01/2023, 7:22 AM

## 2023-10-01 NOTE — ED Notes (Signed)
Pt was able to pivot to bedside commode. Pt had a small bm with blood in it.

## 2023-10-01 NOTE — Assessment & Plan Note (Signed)
-  has been administered IV potassium supplement

## 2023-10-01 NOTE — Plan of Care (Signed)
Patient was seen last night by Dr. Imogene Burn teleneurologist.  In brief, 72 year old female with history of hypertension, rectal bleeding admitted for GI bleeding with syncope.  Reported generalized weakness left more than right, right facial droop and slurred speech.  CT, CT head and neck and MRI negative.  EF 55 to 60%.  Per Dr.Alekh, currently patient all have resolved.  LDL 100, A1c pending, WBC 32.4--4.5.  Patient symptoms more likely syncope followed with post syncope encephalopathy, not quite convincing for TIA, but still in DDx. Recommend ASA 81 if she is cleared by GI. And consider lipitor 20 given LDL at 100. Will follow up as outpt at [facility].   Marvel Plan, MD PhD Stroke Neurology 10/01/2023 3:25 PM

## 2023-10-01 NOTE — Assessment & Plan Note (Signed)
-  pt very anxious on exam although denies any anxiety -continue PRN Alprazolam

## 2023-10-01 NOTE — Progress Notes (Signed)
  Echocardiogram 2D Echocardiogram has been performed.  Sarah [name] 10/01/2023, 8:59 AM

## 2023-10-01 NOTE — Assessment & Plan Note (Signed)
-  reports rectal bleeding starting this afternoon with likely vasovagal syncope while on toilet. Last colonoscopy in June 2024 with removal of 7 hyperplastic polyps and Grade I internal hemorrhoids. EGD in 202 showed GAVE. Had hx of rectal bleeding in May 2023 though likely diverticular source. Follows with GI Dr. Bosie Clos -she also follows with hematology for IV iron transfusions due to intolerance to oral iron  -Hgb stable today at 11 with baseline usually around 8-9. No active bleeding since being in ED  -keep NPO -Message sent to [facility] GI Dr. Marca Ancona for consult in the morning

## 2023-10-01 NOTE — Progress Notes (Signed)
OT Cancellation Note  Patient Details Name: Sarah [name] MRN: [id number] DOB: 1951/09/08   Cancelled Treatment:    Reason Eval/Treat Not Completed: Other (comment) (Note plan to transfer to [facility] per neurology for further TIA workup. Will follow up at a later time.)  [facility] 10/01/2023, 7:09 AM Luisa Dago, OT/L   Acute OT Clinical Specialist Acute Rehabilitation Services Pager [phone] Office [phone]

## 2023-10-01 NOTE — Progress Notes (Signed)
Patient becomes very tachycardic (145bpm) and tachypnic (30 bpm) during ambulation and standing. Saturation remains above 90%. Pulse will return <110 and breathing will become less labored within a minute or two of returning to bed. Denied dizziness or lightheadedness while ambulating. Bed alarm on and discussed with patient need to have staff present for ambulation. Patient verbalized understanding.

## 2023-10-02 DIAGNOSIS — K922 Gastrointestinal hemorrhage, unspecified: Secondary | ICD-10-CM | POA: Diagnosis not present

## 2023-10-02 DIAGNOSIS — R471 Dysarthria and anarthria: Secondary | ICD-10-CM | POA: Diagnosis not present

## 2023-10-02 DIAGNOSIS — R1084 Generalized abdominal pain: Secondary | ICD-10-CM | POA: Diagnosis not present

## 2023-10-02 DIAGNOSIS — K625 Hemorrhage of anus and rectum: Secondary | ICD-10-CM | POA: Diagnosis not present

## 2023-10-02 LAB — BASIC METABOLIC PANEL
Anion gap: 6 (ref 5–15)
BUN: 13 mg/dL (ref 8–23)
CO2: 19 mmol/L — ABNORMAL LOW (ref 22–32)
Calcium: 8.5 mg/dL — ABNORMAL LOW (ref 8.9–10.3)
Chloride: 113 mmol/L — ABNORMAL HIGH (ref 98–111)
Creatinine, Ser: 0.64 mg/dL (ref 0.44–1.00)
GFR, Estimated: >60 mL/min (ref >60)
Glucose, Bld: 121 mg/dL — ABNORMAL HIGH (ref 70–99)
Potassium: 4.3 mmol/L (ref 3.5–5.1)
Sodium: 138 mmol/L (ref 135–145)

## 2023-10-02 LAB — CBC WITH DIFFERENTIAL/PLATELET
Abs Immature Granulocytes: 0.04 10*3/uL (ref 0.00–0.07)
Basophils Absolute: 0 10*3/uL (ref 0.0–0.1)
Basophils Relative: 0 %
Eosinophils Absolute: 0.1 10*3/uL (ref 0.0–0.5)
Eosinophils Relative: 1 %
HCT: 27.2 % — ABNORMAL LOW (ref 36.0–46.0)
Hemoglobin: 9.1 g/dL — ABNORMAL LOW (ref 12.0–15.0)
Immature Granulocytes: 1 %
Lymphocytes Relative: 37 %
Lymphs Abs: 2.7 10*3/uL (ref 0.7–4.0)
MCH: 31.2 pg (ref 26.0–34.0)
MCHC: 33.5 g/dL (ref 30.0–36.0)
MCV: 93.2 fL (ref 80.0–100.0)
Monocytes Absolute: 0.4 10*3/uL (ref 0.1–1.0)
Monocytes Relative: 6 %
Neutro Abs: 4.1 10*3/uL (ref 1.7–7.7)
Neutrophils Relative %: 55 %
Platelets: 228 10*3/uL (ref 150–400)
RBC: 2.92 MIL/uL — ABNORMAL LOW (ref 3.87–5.11)
RDW: 13.8 % (ref 11.5–15.5)
WBC: 7.3 10*3/uL (ref 4.0–10.5)
nRBC: 0 % (ref 0.0–0.2)

## 2023-10-02 LAB — HEMOGLOBIN A1C
Hgb A1c MFr Bld: 6.2 % — ABNORMAL HIGH (ref 4.8–5.6)
Mean Plasma Glucose: 131 mg/dL

## 2023-10-02 LAB — MAGNESIUM: Magnesium: 2.2 mg/dL (ref 1.7–2.4)

## 2023-10-02 NOTE — Evaluation (Signed)
Physical Therapy Evaluation Patient Details Name: Sarah [name] MRN: [id number] DOB: Dec 24, 1950 Today's Date: 10/02/2023  History of Present Illness  72 y.o. female with medical history significant of Nissen fundoplication, iron deficiency anemia, rectal bleed, HTN, GERD, Hypothyroidism, HLD presented with rectal bleeding and subsequently developed slurred speech in the ED. Felt very dizzy and passed out sitting on the toilet reporting that she fell between the toilet and her wall hitting her face In ED, reported slurring of speech and Left side weakness. CT and MRI negative.  Clinical Impression  Pt admitted as above and presenting with functional mobility limitations 2* decreased endurance and ambulatory balance deficits.  This date pt up to ambulate in hall initially requiring min assist for stability but with noted improvement with increased distance - pt reluctant to utilize RW but does admit to occasional use of SPC at home "on a bad day".  Pt with HR elevated to 125 with activity but maintained O2 sats around 98% - RN aware.        If plan is discharge home, recommend the following: A little help with walking and/or transfers;Assistance with cooking/housework;Help with stairs or ramp for entrance;Assist for transportation   Can travel by private vehicle        Equipment Recommendations None recommended by PT  Recommendations for Other Services       Functional Status Assessment Patient has had a recent decline in their functional status and demonstrates the ability to make significant improvements in function in a reasonable and predictable amount of time.     Precautions / Restrictions Precautions Precautions: Fall Restrictions Weight Bearing Restrictions Per Provider Order: No      Mobility  Bed Mobility Overal bed mobility: Modified Independent             General bed mobility comments: no physical assist but did utilize railing    Transfers Overall transfer  level: Needs assistance Equipment used: None Transfers: Sit to/from Stand Sit to Stand: Contact guard assist           General transfer comment: Steady assist with cues for use of UEs for safety    Ambulation/Gait Ambulation/Gait assistance: Min assist, Contact guard assist Gait Distance (Feet): 240 Feet Assistive device: None, 1 person hand held assist Gait Pattern/deviations: Step-to pattern, Step-through pattern, Decreased step length - right, Decreased step length - left, Shuffle, Wide base of support       General Gait Details: Initial instability requiring assist for balance but balance progressively improving with increased distance ambulated  Stairs            Wheelchair Mobility     Tilt Bed    Modified Rankin (Stroke Patients Only)       Balance Overall balance assessment: Mild deficits observed, not formally tested                                           Pertinent Vitals/Pain Pain Assessment Pain Assessment: No/denies pain    Home Living Family/patient expects to be discharged to:: Other (Comment) (ILF) Living Arrangements: Alone                 Additional Comments: Lives in ILF facility. [name] handicap accessible. Has walk in tub/shower with grab bars and bars near toilet also. Uses SPC.    Prior Function Prior Level of Function : Independent/Modified Independent  Mobility Comments: using SPC "on a bad day"       Extremity/Trunk Assessment   Upper Extremity Assessment Upper Extremity Assessment: Overall WFL for tasks assessed    Lower Extremity Assessment Lower Extremity Assessment: Overall WFL for tasks assessed    Cervical / Trunk Assessment Cervical / Trunk Assessment: Normal  Communication   Communication Communication: No apparent difficulties  Cognition Arousal: Alert Behavior During Therapy: WFL for tasks assessed/performed Overall Cognitive Status: Within Functional Limits  for tasks assessed                                          General Comments      Exercises     Assessment/Plan    PT Assessment Patient needs continued PT services  PT Problem List Decreased activity tolerance;Decreased balance;Decreased mobility       PT Treatment Interventions DME instruction;Gait training;Functional mobility training;Therapeutic activities;Therapeutic exercise;Balance training;Patient/family education    PT Goals (Current goals can be found in the Care Plan section)  Acute Rehab PT Goals Patient Stated Goal: Regain IND PT Goal Formulation: With patient Time For Goal Achievement: 10/16/23 Potential to Achieve Goals: Good    Frequency Min 1X/week     Co-evaluation               AM-PAC PT "6 Clicks" Mobility  Outcome Measure                  End of Session Equipment Utilized During Treatment: Gait belt Activity Tolerance: Patient tolerated treatment well Patient left: in chair;with call bell/phone within reach;with chair alarm set Nurse Communication: Mobility status PT Visit Diagnosis: History of falling (Z91.81);Difficulty in walking, not elsewhere classified (R26.2)    Time: 2025-4270 PT Time Calculation (min) (ACUTE ONLY): 20 min   Charges:   PT Evaluation $PT Eval Low Complexity: 1 Low   PT General Charges $$ ACUTE PT VISIT: 1 Visit         [name] PT Acute Rehabilitation Services Pager [phone] Office [phone]   Taneah Masri 10/02/2023, 2:23 PM

## 2023-10-02 NOTE — Evaluation (Signed)
Occupational Therapy Evaluation/Discharge Patient Details Name: Sarah [name] MRN: [id number] DOB: 04/30/1951 Today's Date: 10/02/2023   History of Present Illness 72 y.o. female with medical history significant of Nissen fundoplication, iron deficiency anemia, rectal bleed, HTN, GERD, Hypothyroidism, HLD presented with rectal bleeding and subsequently developed slurred speech in the ED. Felt very dizzy and passed out sitting on the toilet reporting that she fell between the toilet and her wall hitting her face In ED, reported slurring of speech and Left side weakness. CT and MRI negative.   Clinical Impression   Patient evaluated by Occupational Therapy with no further acute OT needs identified. All education has been completed and the patient has no further questions. Pt education provided on BE FAST stroke warning signs. Provided handout for reference. No follow-up Occupational Therapy or equipment needs. OT is signing off. Thank you for this referral.        If plan is discharge home, recommend the following: Assist for transportation    Functional Status Assessment  Patient has not had a recent decline in their functional status  Equipment Recommendations  None recommended by OT       Precautions / Restrictions Precautions Precautions: None Restrictions Weight Bearing Restrictions Per Provider Order: No      Mobility           Transfers Overall transfer level: Needs assistance Equipment used: None Transfers: Sit to/from Stand Sit to Stand: Contact guard assist         Balance Overall balance assessment: Mild deficits observed, not formally tested           ADL either performed or assessed with clinical judgement   ADL Overall ADL's : Modified independent;At baseline        Vision Baseline Vision/History: 0 No visual deficits Ability to See in Adequate Light: 0 Adequate Patient Visual Report: No change from baseline Vision Assessment?: No apparent  visual deficits     Perception Perception: Not tested       Praxis Praxis: Not tested       Pertinent Vitals/Pain Pain Assessment Pain Assessment: No/denies pain     Extremity/Trunk Assessment Upper Extremity Assessment Upper Extremity Assessment: Overall WFL for tasks assessed   Lower Extremity Assessment Lower Extremity Assessment: Defer to PT evaluation   Cervical / Trunk Assessment Cervical / Trunk Assessment: Normal   Communication     Cognition Arousal: Alert Behavior During Therapy: WFL for tasks assessed/performed Overall Cognitive Status: Within Functional Limits for tasks assessed                  Home Living Family/patient expects to be discharged to:: Other (Comment) (ILF) Living Arrangements: Alone    Additional Comments: Lives in ILF facility. [name] is handicap accessible. Has walk in tub/shower with grab bars and bars near toilet also. Uses SPC.      Prior Functioning/Environment Prior Level of Function : Independent/Modified Independent         OT Problem List: Impaired balance (sitting and/or standing)         OT Goals(Current goals can be found in the care plan section) Acute Rehab OT Goals OT Goal Formulation: All assessment and education complete, DC therapy  OT Frequency:  1X visit       AM-PAC OT "6 Clicks" Daily Activity     Outcome Measure Help from another person eating meals?: None Help from another person taking care of personal grooming?: None Help from another person toileting, which includes using toliet, bedpan, or  urinal?: None Help from another person bathing (including washing, rinsing, drying)?: None Help from another person to put on and taking off regular upper body clothing?: None Help from another person to put on and taking off regular lower body clothing?: None 6 Click Score: 24   End of Session    Activity Tolerance: Patient tolerated treatment well Patient left: in chair;with call bell/phone within  reach;with chair alarm set  OT Visit Diagnosis: Unsteadiness on feet (R26.81)                Time: 5284-1324 OT Time Calculation (min): 15 min Charges:  OT General Charges $OT Visit: 1 Visit OT Evaluation $OT Eval Low Complexity: 1 Low  Sarah [name], OTR/L,CBIS  Supplemental OT - [facility] and [facility] Secure Chat Preferred    Sarah [name], Sarah [name] 10/02/2023, 11:53 AM

## 2023-10-02 NOTE — Care Management Obs Status (Signed)
MEDICARE OBSERVATION STATUS NOTIFICATION   Patient Details  Name: Sarah [name] MRN: [id number] Date of Birth: 25-Feb-1951   Medicare Observation Status Notification Given:  Yes    Adrian Prows, RN 10/02/2023, 9:45 AM

## 2023-10-02 NOTE — Progress Notes (Signed)
[facility]ology Progress Note  TULEEN BRODOWSKI 72 y.o. March 25, 1951   Subjective: Reports small dark black stool. Denies abdominal pain. Feels better. Sitting in bedside chair.  Objective: Vital signs: Vitals:   10/02/23 0449 10/02/23 0933  BP: 129/76 119/81  Pulse: (!) 105 (!) 107  Resp: 18 20  Temp: 98 F (36.7 C) 97.7 F (36.5 C)  SpO2: 100% 100%    Physical Exam: Gen: lethargic, no acute distress  HEENT: anicteric sclera CV: RRR Chest: CTA B Abd: soft, minimal diffuse tenderness with guarding, nondistended, +BS Ext: no edema  Lab Results: Recent Labs    10/01/23 0756 10/02/23 0501  NA 139 138  K 3.0* 4.3  CL 112* 113*  CO2 19* 19*  GLUCOSE 121* 121*  BUN 19 13  CREATININE 0.64 0.64  CALCIUM 8.5* 8.5*  MG 2.1 2.2   Recent Labs    09/30/23 2036 10/01/23 0756  AST 22 23  ALT 19 20  ALKPHOS 57 55  BILITOT 0.4 0.7  PROT 6.0* 6.2*  ALBUMIN 3.4* 3.6   Recent Labs    10/01/23 0756 10/02/23 0501  WBC 8.5 7.3  NEUTROABS 5.4 4.1  HGB 10.3* 9.1*  HCT 28.4* 27.2*  MCV 89.9 93.2  PLT 223 228      Assessment/Plan: GI Bleed - likely lower that is resolving. Hgb 9.1. Lactate negative. I do not think endoscopic procedures are needed. Advance diet and if stable ok for home tomorrow.   Shirley Friar 10/02/2023, 12:24 PM  Questions please call [phone] ID: Magda Bernheim, female   DOB: 1950/10/10, 72 y.o.   MRN: [id number]

## 2023-10-02 NOTE — TOC Initial Note (Signed)
Transition of Care [facility]) - Initial/Assessment Note    Patient Details  Name: Sarah [name] MRN: [id number] Date of Birth: 1950-12-11  [facility] CM/SW Contact:    Adrian Prows, RN Phone Number: 10/02/2023, 10:23 AM  Clinical Narrative:                 SDOH risks; spoke w/ pt in room; pt says she lives at home; she plans to return at d/c; pt verified she has insurance/PCP; she identified POC Sarah [name] (son) [phone]; pt says she has transportation home; pt requests resources to help pay water bill; she denies other SDOH risks; pt says she has a cane; she agrees to receive resources for social services and financial assistance; resources placed in d/c instructions; pt also given copies; also explained MOON to pt; she verbalized understanding and signed doucment; TOC will follow.   Expected Discharge Plan: Home/Self Care Barriers to Discharge: Continued Medical Work up   Patient Goals and CMS Choice Patient states their goals for this hospitalization and ongoing recovery are:: home CMS Medicare.gov Compare Post Acute Care list provided to:: Patient   [place] ownership interest in [facility].provided to:: Patient    Expected Discharge Plan and Services   Discharge Planning Services: CM Consult   Living arrangements for the past 2 months: Apartment                                      Prior Living Arrangements/Services Living arrangements for the past 2 months: Apartment Lives with:: Self Patient language and need for interpreter reviewed:: Yes Do you feel safe going back to the place where you live?: Yes      Need for Family Participation in Patient Care: Yes (Comment) Care giver support system in place?: Yes (comment) Current home services: DME (cane) Criminal Activity/Legal Involvement Pertinent to Current Situation/Hospitalization: No - Comment as needed  Activities of Daily Living   ADL Screening (condition at  time of admission) Independently performs ADLs?: Yes (appropriate for developmental age) Is the patient deaf or have difficulty hearing?: No Does the patient have difficulty seeing, even when wearing glasses/contacts?: No Does the patient have difficulty concentrating, remembering, or making decisions?: No  Permission Sought/Granted Permission sought to share information with : Case Manager Permission granted to share information with : Yes, Verbal Permission Granted  Share Information with NAME: Case Manager     Permission granted to share info w Relationship: Sarah [name] (son) [phone]     Emotional Assessment Appearance:: Appears stated age Attitude/Demeanor/Rapport: Gracious Affect (typically observed): Accepting Orientation: : Oriented to  Time, Oriented to Place, Oriented to Self, Oriented to Situation Alcohol / Substance Use: Not Applicable Psych Involvement: No (comment)  Admission diagnosis:  TIA (transient ischemic attack) [G45.9] Rectal bleeding [K62.5] GI bleed [K92.2] Dysarthria [R47.1] Patient Active Problem List   Diagnosis Date Noted   Hypokalemia 10/01/2023   Left-sided weakness 10/01/2023   GI bleed 10/01/2023   Iron deficiency anemia 05/19/2023   Osteoarthritis 02/04/2022   BRBPR (bright red blood per rectum) 02/02/2022   Hypothyroidism 02/02/2022   Anxiety 02/02/2022   Primary localized osteoarthritis of right hip 04/09/2020   S/P Nissen fundoplication (without gastrostomy tube) procedure 03/27/2019   Urge incontinence 10/21/2017   Dyslipidemia 01/31/2016   Psychosomatic factor in physical condition 01/31/2016   Aphasia    Blurred vision    Essential hypertension  Expressive aphasia 01/30/2016   Dysarthria 01/30/2016   HTN (hypertension) 01/30/2016   Dizziness    Headache    Slurred speech    Rectocele,recurrent 10/16/2015   PCP:  Assunta Found, MD Pharmacy:   [facility] [phone] - [place], [place] - [address] [place] [address] Phone: [phone] Fax: [phone]     Social Drivers of Health (SDOH) Social History: SDOH Screenings   Food Insecurity: No Food Insecurity (10/02/2023)  Recent Concern: Food Insecurity - Food Insecurity Present (10/01/2023)  Housing: Low Risk  (10/02/2023)  Transportation Needs: No Transportation Needs (10/02/2023)  Utilities: Not At Risk (10/02/2023)  Alcohol Screen: Low Risk  (11/05/2022)  Depression (PHQ2-9): Low Risk  (05/20/2023)  Financial Resource Strain: Low Risk  (11/05/2022)  Physical Activity: Insufficiently Active (11/05/2022)  Social Connections: Moderately Integrated (11/05/2022)  Stress: No Stress Concern Present (11/05/2022)  Tobacco Use: Low Risk  (10/01/2023)   SDOH Interventions: Food Insecurity Interventions: Intervention Not Indicated, Inpatient TOC Transportation Interventions: Intervention Not Indicated, Inpatient TOC Utilities Interventions: Inpatient TOC, Community Resources Provided (patient requested resources to assist w/ water bill)   Readmission Risk Interventions     No data to display

## 2023-10-02 NOTE — Progress Notes (Signed)
PROGRESS NOTE    Sarah [name]  ZOX:[id number] DOB: 06-15-1951 DOA: 09/30/2023 PCP: Assunta Found, MD   Brief Narrative:   72 y.o. female with medical history significant of Nissen fundoplication, iron deficiency anemia, rectal bleed, HTN, GERD, Hypothyroidism, HLD presented with rectal bleeding and subsequently developed slurred speech in the ED. On presentation, hemoglobin was 11.  Code stroke was activated and she was evaluated by teleneurology.  CT head and CTA head and neck were negative for acute intracranial abnormality and LVO.  GI was consulted.  Initial plan was to transfer her to [facility] for neurology evaluation and follow-up but since MRI of brain was negative for acute stroke.  Transfer was canceled.  Neurology recommended outpatient follow-up with neurology and start baby aspirin once cleared by GI.  Assessment & Plan:   Possible lower GI bleeding presenting with rectal bleeding -GI following.  Monitor H&H.  Hemoglobin is slightly drifting downwards and is at 9.1 this morning.  Patient continues to have some rectal bleeding.  Currently on clear liquid diet.  Diet advancement as per GI.  Follow GI recommendations  Possible TIA presenting with slurred speech -Symptoms have resolved.  Workup negative so far including CT head and CTA head and neck were negative for acute intracranial abnormality and LVO; MRI of brain was negative for acute stroke. -Echo showed EF of 55 to 60% with grade 1 diastolic dysfunction -LDL 100.  A1c 6.2.  Neurology recommends to consider Lipitor 20 mg.  Start the same.  Neurology also recommends to start aspirin 81 mg daily once cleared by GI.  Outpatient follow-up with [facility] neurology -Tolerating diet.  Ambulating well.  PT/OT eval pending  Hypothyroidism Continue levothyroxine  Anxiety -Continue as needed alprazolam  Hypertension FEN blood pressure intermittently on the lower side.  Hydrochlorothiazide on hold  IBS -Continue tenapanor.   Outpatient follow-up with GI  Hypokalemia -Resolved  Acute metabolic acidosis -Mild.  Monitor.  Encourage oral intake.  DVT prophylaxis: SCDs Code Status: Full Family Communication: None at bedside Disposition Plan: Status is: Observation The patient will require care spanning > 2 midnights and should be moved to inpatient because: Of severity of illness.  Continues to have rectal bleeding    Consultants: GI/neurology  Procedures: 2D echo  Antimicrobials: None   Subjective: Patient seen and examined at bedside.  Feels anxious.  Still having intermittent rectal bleeding.  No fever, chest pain, vomiting reported.  Objective: Vitals:   10/01/23 2114 10/02/23 0127 10/02/23 0449 10/02/23 0933  BP: 133/74 (!) 106/52 129/76 119/81  Pulse: (!) 103 (!) 108 (!) 105 (!) 107  Resp: 18 18 18 20   Temp: 99 F (37.2 C) 98.7 F (37.1 C) 98 F (36.7 C) 97.7 F (36.5 C)  TempSrc: Oral Oral Oral Oral  SpO2: 98% 97% 100% 100%  Weight:      Height:        Intake/Output Summary (Last 24 hours) at 10/02/2023 1047 Last data filed at 10/02/2023 1000 Gross per 24 hour  Intake 810 ml  Output 1500 ml  Net -690 ml   Filed Weights   09/30/23 2006  Weight: 60.8 kg    Examination:  General exam: Appears calm and comfortable.  On room air.  Looks chronically ill and deconditioned. Respiratory system: Bilateral decreased breath sounds at bases Cardiovascular system: S1 & S2 heard, intermittently tachycardic gastrointestinal system: Abdomen is nondistended, soft and nontender. Normal bowel sounds heard. Extremities: No cyanosis, clubbing, edema  Central nervous system: Alert and oriented.  Slow to respond.  Poor historian.  No focal neurological deficits. Moving extremities Skin: No rashes, lesions or ulcers Psychiatry: Flat affect.  Not agitated.  Data Reviewed: I have personally reviewed following labs and imaging studies  CBC: Recent Labs  Lab 09/30/23 2036 10/01/23 0756  10/02/23 0501  WBC 11.1* 8.5 7.3  NEUTROABS 8.0* 5.4 4.1  HGB 11.0* 10.3* 9.1*  HCT 30.5* 28.4* 27.2*  MCV 89.2 89.9 93.2  PLT 251 223 228   Basic Metabolic Panel: Recent Labs  Lab 09/30/23 2036 10/01/23 0756 10/02/23 0501  NA 137 139 138  K 3.0* 3.0* 4.3  CL 110 112* 113*  CO2 20* 19* 19*  GLUCOSE 171* 121* 121*  BUN 23 19 13   CREATININE 0.72 0.64 0.64  CALCIUM 8.6* 8.5* 8.5*  MG  --  2.1 2.2   GFR: Estimated Creatinine Clearance: 56.2 mL/min (by C-G formula based on SCr of 0.64 mg/dL). Liver Function Tests: Recent Labs  Lab 09/30/23 2036 10/01/23 0756  AST 22 23  ALT 19 20  ALKPHOS 57 55  BILITOT 0.4 0.7  PROT 6.0* 6.2*  ALBUMIN 3.4* 3.6   No results for input(s): "LIPASE", "AMYLASE" in the last 168 hours. No results for input(s): "AMMONIA" in the last 168 hours. Coagulation Profile: Recent Labs  Lab 09/30/23 2036  INR 1.0   Cardiac Enzymes: No results for input(s): "CKTOTAL", "CKMB", "CKMBINDEX", "TROPONINI" in the last 168 hours. BNP (last 3 results) No results for input(s): "PROBNP" in the last 8760 hours. HbA1C: Recent Labs    10/01/23 0432  HGBA1C 6.2*   CBG: No results for input(s): "GLUCAP" in the last 168 hours. Lipid Profile: Recent Labs    09/30/23 0858 10/01/23 0432  CHOL 199 152  HDL 48 39*  LDLCALC 137* 100*  TRIG 77 66  CHOLHDL 4.1 3.9   Thyroid Function Tests: No results for input(s): "TSH", "T4TOTAL", "FREET4", "T3FREE", "THYROIDAB" in the last 72 hours. Anemia Panel: No results for input(s): "VITAMINB12", "FOLATE", "FERRITIN", "TIBC", "IRON", "RETICCTPCT" in the last 72 hours. Sepsis Labs: Recent Labs  Lab 10/01/23 1035 10/01/23 1319  LATICACIDVEN 1.5 1.1    No results found for this or any previous visit (from the past 240 hours).       Radiology Studies: ECHOCARDIOGRAM COMPLETE Result Date: 10/01/2023    ECHOCARDIOGRAM REPORT   Patient Name:   Sarah [name] Date of Exam: 10/01/2023 Medical Rec #:   [id number]       Height:       62.0 in Accession #:    [id number]      Weight:       134.0 lb Date of Birth:  [birth year]       BSA:          1.612 m Patient Age:    72 years        BP:           127/72 mmHg Patient Gender: F               HR:           91 bpm. Exam Location:  Inpatient Procedure: 2D Echo, Cardiac Doppler and Color Doppler Indications:    TIA  History:        Patient has prior history of Echocardiogram examinations, most                 recent 01/31/2016. Risk Factors:Hypertension and Dyslipidemia.  Sonographer:    Karma Ganja Referring Phys: [id number] CHING  T TU IMPRESSIONS  1. Left ventricular ejection fraction, by estimation, is 55 to 60%. The left ventricle has normal function. The left ventricle has no regional wall motion abnormalities. There is mild left ventricular hypertrophy. Left ventricular diastolic parameters are consistent with Grade I diastolic dysfunction (impaired relaxation).  2. Right ventricular systolic function is normal. The right ventricular size is normal.  3. The mitral valve is normal in structure. No evidence of mitral valve regurgitation. No evidence of mitral stenosis.  4. The aortic valve is tricuspid. Aortic valve regurgitation is not visualized. Aortic valve sclerosis is present, with no evidence of aortic valve stenosis.  5. The inferior vena cava is normal in size with greater than 50% respiratory variability, suggesting right atrial pressure of 3 mmHg. Comparison(s): No prior Echocardiogram. FINDINGS  Left Ventricle: Left ventricular ejection fraction, by estimation, is 55 to 60%. The left ventricle has normal function. The left ventricle has no regional wall motion abnormalities. The left ventricular internal cavity size was normal in size. There is  mild left ventricular hypertrophy. Left ventricular diastolic parameters are consistent with Grade I diastolic dysfunction (impaired relaxation). Right Ventricle: The right ventricular size is normal. Right ventricular  systolic function is normal. Left Atrium: Left atrial size was normal in size. Right Atrium: Right atrial size was normal in size. Pericardium: There is no evidence of pericardial effusion. Mitral Valve: The mitral valve is normal in structure. No evidence of mitral valve regurgitation. No evidence of mitral valve stenosis. Tricuspid Valve: The tricuspid valve is normal in structure. Tricuspid valve regurgitation is not demonstrated. No evidence of tricuspid stenosis. Aortic Valve: The aortic valve is tricuspid. Aortic valve regurgitation is not visualized. Aortic valve sclerosis is present, with no evidence of aortic valve stenosis. Aortic valve mean gradient measures 3.0 mmHg. Aortic valve peak gradient measures 5.7  mmHg. Aortic valve area, by VTI measures 2.12 cm. Pulmonic Valve: The pulmonic valve was normal in structure. Pulmonic valve regurgitation is trivial. No evidence of pulmonic stenosis. Aorta: The aortic root is normal in size and structure. Venous: The inferior vena cava is normal in size with greater than 50% respiratory variability, suggesting right atrial pressure of 3 mmHg. IAS/Shunts: The interatrial septum was not well visualized.  LEFT VENTRICLE PLAX 2D LVIDd:         3.70 cm     Diastology LVIDs:         2.90 cm     LV e' medial:    5.00 cm/s LV PW:         1.30 cm     LV E/e' medial:  11.6 LV IVS:        1.10 cm     LV e' lateral:   10.10 cm/s LVOT diam:     2.00 cm     LV E/e' lateral: 5.7 LV SV:         46 LV SV Index:   28 LVOT Area:     3.14 cm  LV Volumes (MOD) LV vol d, MOD A2C: 40.4 ml LV vol d, MOD A4C: 71.2 ml LV vol s, MOD A2C: 21.2 ml LV vol s, MOD A4C: 32.6 ml LV SV MOD A2C:     19.2 ml LV SV MOD A4C:     71.2 ml LV SV MOD BP:      30.1 ml RIGHT VENTRICLE             IVC RV Basal diam:  3.60 cm     IVC  diam: 1.70 cm RV S prime:     17.20 cm/s TAPSE (M-mode): 2.3 cm LEFT ATRIUM             Index        RIGHT ATRIUM           Index LA diam:        4.10 cm 2.54 cm/m   RA Area:      15.80 cm LA Vol (A2C):   28.9 ml 17.92 ml/m  RA Volume:   40.90 ml  25.36 ml/m LA Vol (A4C):   40.5 ml 25.12 ml/m LA Biplane Vol: 35.5 ml 22.02 ml/m  AORTIC VALVE AV Area (Vmax):    1.95 cm AV Area (Vmean):   1.75 cm AV Area (VTI):     2.12 cm AV Vmax:           119.00 cm/s AV Vmean:          85.400 cm/s AV VTI:            0.216 m AV Peak Grad:      5.7 mmHg AV Mean Grad:      3.0 mmHg LVOT Vmax:         73.80 cm/s LVOT Vmean:        47.600 cm/s LVOT VTI:          0.146 m LVOT/AV VTI ratio: 0.68  AORTA Ao Root diam: 3.40 cm Ao Asc diam:  3.20 cm MITRAL VALVE MV Area (PHT): 5.88 cm    SHUNTS MV Decel Time: 129 msec    Systemic VTI:  0.15 m MV E velocity: 57.80 cm/s  Systemic Diam: 2.00 cm MV A velocity: 91.70 cm/s MV E/A ratio:  0.63 Olga Millers MD Electronically signed by Olga Millers MD Signature Date/Time: 10/01/2023/9:41:27 AM    Final    MR BRAIN WO CONTRAST Result Date: 10/01/2023 CLINICAL DATA:  Loss of consciousness, dysarthria EXAM: MRI HEAD WITHOUT CONTRAST TECHNIQUE: Multiplanar, multiecho pulse sequences of the brain and surrounding structures were obtained without intravenous contrast. COMPARISON:  09/30/2023 CT head, 04/21/2017 MRI head FINDINGS: Brain: No restricted diffusion to suggest acute or subacute infarct. No acute hemorrhage, mass, mass effect, or midline shift. No hydrocephalus or extra-axial collection. Pituitary and craniocervical junction within normal limits. No hemosiderin deposition to suggest remote hemorrhage. Advanced cerebral atrophy for age. Confluent T2 hyperintense signal in the periventricular white matter and pons, likely the sequela of moderate chronic small vessel ischemic disease. Vascular: Normal arterial flow voids. Skull and upper cervical spine: Normal marrow signal. Sinuses/Orbits: Clear paranasal sinuses. No acute finding in the orbits. Other: The mastoid air cells are well aerated. IMPRESSION: No acute intracranial process. No evidence of acute or  subacute infarct. Electronically Signed   By: Wiliam Ke M.D.   On: 10/01/2023 03:43   CT ANGIO HEAD NECK W WO CM W PERF (CODE STROKE) Result Date: 09/30/2023 CLINICAL DATA:  Left-sided weakness in arm and leg EXAM: CT ANGIOGRAPHY HEAD AND NECK CT PERFUSION BRAIN TECHNIQUE: Multidetector CT imaging of the head and neck was performed using the standard protocol during bolus administration of intravenous contrast. Multiplanar CT image reconstructions and MIPs were obtained to evaluate the vascular anatomy. Carotid stenosis measurements (when applicable) are obtained utilizing NASCET criteria, using the distal internal carotid diameter as the denominator. Multiphase CT imaging of the brain was performed following IV bolus contrast injection. Subsequent parametric perfusion maps were calculated using RAPID software. RADIATION DOSE REDUCTION: This exam was performed according to  the departmental dose-optimization program which includes automated exposure control, adjustment of the mA and/or kV according to patient size and/or use of iterative reconstruction technique. CONTRAST:  OMNIPAQUE IOHEXOL 350 MG/ML SOLN COMPARISON:  CT head 09/30/2023, MRA head 01/31/2016 FINDINGS: CT HEAD FINDINGS For noncontrast findings, please see same day CT head. CTA NECK FINDINGS Aortic arch: Two-vessel arch with a common origin of the brachiocephalic and left common carotid arteries. Imaged portion shows no evidence of aneurysm or dissection. No significant stenosis of the major arch vessel origins. Right carotid system: No evidence of dissection, occlusion, or hemodynamically significant stenosis (greater than 50%). Left carotid system: No evidence of dissection, occlusion, or hemodynamically significant stenosis (greater than 50%). Vertebral arteries: No evidence of dissection, occlusion, or hemodynamically significant stenosis (greater than 50%). Skeleton: No acute osseous abnormality. Degenerative changes in the cervical  spine. Other neck: No acute finding. Upper chest: No focal pulmonary opacity or pleural effusion. Review of the MIP images confirms the above findings CTA HEAD FINDINGS Anterior circulation: Both internal carotid arteries are patent to the termini, without significant stenosis. A1 segments patent. Normal anterior communicating artery. Anterior cerebral arteries are patent to their distal aspects without significant stenosis. No M1 stenosis or occlusion. MCA branches perfused to their distal aspects without significant stenosis. Posterior circulation: Vertebral arteries patent to the vertebrobasilar junction without significant stenosis. Posterior inferior cerebellar arteries patent proximally. Basilar patent to its distal aspect without significant stenosis. Superior cerebellar arteries patent proximally. Patent P1 segments. PCAs perfused to their distal aspects without significant stenosis. Venous sinuses: As permitted by contrast timing, patent. Anatomic variants: None significant. No evidence of aneurysm or vascular malformation. Review of the MIP images confirms the above findings IMPRESSION: 1. No intracranial large vessel occlusion or significant stenosis. 2. No hemodynamically significant stenosis in the neck. 3. No perfusion abnormality. Electronically Signed   By: Wiliam Ke M.D.   On: 09/30/2023 22:08   CT HEAD CODE STROKE WO CONTRAST Result Date: 09/30/2023 CLINICAL DATA:  Code stroke. EXAM: CT HEAD WITHOUT CONTRAST TECHNIQUE: Contiguous axial images were obtained from the base of the skull through the vertex without intravenous contrast. RADIATION DOSE REDUCTION: This exam was performed according to the departmental dose-optimization program which includes automated exposure control, adjustment of the mA and/or kV according to patient size and/or use of iterative reconstruction technique. COMPARISON:  09/30/2019 FINDINGS: Brain: No evidence of acute infarction, hemorrhage, mass, mass effect, or  midline shift. No hydrocephalus or extra-axial collection. Periventricular white matter changes, likely the sequela of chronic small vessel ischemic disease. Advanced cerebral atrophy for age. Vascular: No hyperdense vessel. Atherosclerotic calcifications in the intracranial carotid and vertebral arteries. Skull: Negative for fracture or focal lesion. Sinuses/Orbits: No acute finding. Other: The mastoid air cells are well aerated. ASPECTS [facility] Stroke Program Early CT Score) - Ganglionic level infarction (caudate, lentiform nuclei, internal capsule, insula, M1-M3 cortex): 7 - Supraganglionic infarction (M4-M6 cortex): 3 Total score (0-10 with 10 being normal): 10 IMPRESSION: No acute intracranial process. ASPECTS is 10. Code stroke imaging results were communicated on 09/30/2023 at 9:00 pm to provider Groce via telephone, who verbally acknowledged these results. Electronically Signed   By: Wiliam Ke M.D.   On: 09/30/2023 21:00        Scheduled Meds:   stroke: early stages of recovery book   Does not apply Once   levothyroxine  50 mcg Oral Q0600   Tenapanor HCl  1 tablet Oral BID   Continuous Infusions:  Glade Lloyd, MD [facility] 10/02/2023, 10:47 AM

## 2023-10-03 DIAGNOSIS — R471 Dysarthria and anarthria: Secondary | ICD-10-CM | POA: Diagnosis not present

## 2023-10-03 DIAGNOSIS — R1084 Generalized abdominal pain: Secondary | ICD-10-CM | POA: Diagnosis not present

## 2023-10-03 DIAGNOSIS — K922 Gastrointestinal hemorrhage, unspecified: Secondary | ICD-10-CM | POA: Diagnosis not present

## 2023-10-03 DIAGNOSIS — K625 Hemorrhage of anus and rectum: Secondary | ICD-10-CM | POA: Diagnosis not present

## 2023-10-03 LAB — BASIC METABOLIC PANEL
Anion gap: 3 — ABNORMAL LOW (ref 5–15)
BUN: 12 mg/dL (ref 8–23)
CO2: 24 mmol/L (ref 22–32)
Calcium: 8.7 mg/dL — ABNORMAL LOW (ref 8.9–10.3)
Chloride: 112 mmol/L — ABNORMAL HIGH (ref 98–111)
Creatinine, Ser: 0.74 mg/dL (ref 0.44–1.00)
GFR, Estimated: >60 mL/min (ref >60)
Glucose, Bld: 117 mg/dL — ABNORMAL HIGH (ref 70–99)
Potassium: 4 mmol/L (ref 3.5–5.1)
Sodium: 139 mmol/L (ref 135–145)

## 2023-10-03 LAB — MAGNESIUM: Magnesium: 2.2 mg/dL (ref 1.7–2.4)

## 2023-10-03 LAB — CBC WITH DIFFERENTIAL/PLATELET
Abs Immature Granulocytes: 0.03 10*3/uL (ref 0.00–0.07)
Basophils Absolute: 0 10*3/uL (ref 0.0–0.1)
Basophils Relative: 0 %
Eosinophils Absolute: 0.1 10*3/uL (ref 0.0–0.5)
Eosinophils Relative: 2 %
HCT: 25.6 % — ABNORMAL LOW (ref 36.0–46.0)
Hemoglobin: 8.9 g/dL — ABNORMAL LOW (ref 12.0–15.0)
Immature Granulocytes: 0 %
Lymphocytes Relative: 35 %
Lymphs Abs: 2.8 10*3/uL (ref 0.7–4.0)
MCH: 32.2 pg (ref 26.0–34.0)
MCHC: 34.8 g/dL (ref 30.0–36.0)
MCV: 92.8 fL (ref 80.0–100.0)
Monocytes Absolute: 0.5 10*3/uL (ref 0.1–1.0)
Monocytes Relative: 6 %
Neutro Abs: 4.5 10*3/uL (ref 1.7–7.7)
Neutrophils Relative %: 57 %
Platelets: 209 10*3/uL (ref 150–400)
RBC: 2.76 MIL/uL — ABNORMAL LOW (ref 3.87–5.11)
RDW: 13.7 % (ref 11.5–15.5)
WBC: 8 10*3/uL (ref 4.0–10.5)
nRBC: 0 % (ref 0.0–0.2)

## 2023-10-03 MED ORDER — ASPIRIN 81 MG PO TBEC: 81.0000 mg | DELAYED_RELEASE_TABLET | Freq: Every day | ORAL | 1 refills | Status: DC

## 2023-10-03 MED ORDER — ATORVASTATIN CALCIUM 20 MG PO TABS: 20.0000 mg | ORAL_TABLET | Freq: Every day | ORAL | 1 refills | Status: AC

## 2023-10-03 NOTE — Progress Notes (Signed)
Physical Therapy Treatment Patient Details Name: Sarah [name] MRN: [id number] DOB: [birth year] Today's Date: 10/03/2023   History of Present Illness 72 y.o. female with medical history significant of Nissen fundoplication, iron deficiency anemia, rectal bleed, HTN, GERD, Hypothyroidism, HLD presented with rectal bleeding and subsequently developed slurred speech in the ED. Felt very dizzy and passed out sitting on the toilet reporting that she fell between the toilet and her wall hitting her face In ED, reported slurring of speech and Left side weakness. CT and MRI negative.    PT Comments  Pt continues very motivated and with noted improvement in activity tolerance and in stability with gait.  Pt expressing interest in RW for home but declines based on time frame for delivery - pt states neighbor at [facility] has offered to loan her one.  Pt eager for dc home this date.    If plan is discharge home, recommend the following: A little help with walking and/or transfers;Assistance with cooking/housework;Help with stairs or ramp for entrance;Assist for transportation   Can travel by private vehicle        Equipment Recommendations  None recommended by PT    Recommendations for Other Services       Precautions / Restrictions Precautions Precautions: Fall Restrictions Weight Bearing Restrictions Per Provider Order: No     Mobility  Bed Mobility               General bed mobility comments: Up in chair and returns to same    Transfers Overall transfer level: Needs assistance Equipment used: None Transfers: Sit to/from Stand Sit to Stand: Supervision           General transfer comment: cues for use of UEs for safety    Ambulation/Gait Ambulation/Gait assistance: Contact guard assist, Supervision Gait Distance (Feet): 400 Feet Assistive device: None, 1 person hand held assist Gait Pattern/deviations: Step-to pattern, Step-through pattern, Decreased step length - right,  Decreased step length - left, Shuffle, Wide base of support       General Gait Details: Initial instability improving with increased distance ambulated.  Pt CGA for stepping bkwd   Stairs             Wheelchair Mobility     Tilt Bed    Modified Rankin (Stroke Patients Only)       Balance Overall balance assessment: Mild deficits observed, not formally tested                                          Cognition Arousal: Alert Behavior During Therapy: WFL for tasks assessed/performed Overall Cognitive Status: Within Functional Limits for tasks assessed                                          Exercises      General Comments        Pertinent Vitals/Pain Pain Assessment Pain Assessment: No/denies pain    Home Living                          Prior Function            PT Goals (current goals can now be found in the care plan section) Acute Rehab PT Goals Patient Stated Goal: Regain IND  PT Goal Formulation: With patient Time For Goal Achievement: 10/16/23 Potential to Achieve Goals: Good Progress towards PT goals: Progressing toward goals    Frequency    Min 1X/week      PT Plan      Co-evaluation              AM-PAC PT "6 Clicks" Mobility   Outcome Measure  Help needed turning from your back to your side while in a flat bed without using bedrails?: None Help needed moving from lying on your back to sitting on the side of a flat bed without using bedrails?: None Help needed moving to and from a bed to a chair (including a wheelchair)?: A Little Help needed standing up from a chair using your arms (e.g., wheelchair or bedside chair)?: A Little Help needed to walk in hospital room?: A Little Help needed climbing 3-5 steps with a railing? : A Little 6 Click Score: 20    End of Session Equipment Utilized During Treatment: Gait belt Activity Tolerance: Patient tolerated treatment well Patient  left: in chair;with call bell/phone within reach;with chair alarm set Nurse Communication: Mobility status PT Visit Diagnosis: History of falling (Z91.81);Difficulty in walking, not elsewhere classified (R26.2)     Time: 8469-6295 PT Time Calculation (min) (ACUTE ONLY): 11 min  Charges:    $Gait Training: 8-22 mins PT General Charges $$ ACUTE PT VISIT: 1 Visit                     [name] PT Acute Rehabilitation Services Pager [phone] Office [phone]    Tito Ausmus 10/03/2023, 12:05 PM

## 2023-10-03 NOTE — Progress Notes (Signed)
[facility] Gastroenterology Progress Note  Sarah [name] 72 y.o. 1951/10/05   Subjective: Red stool yesterday. Denies abdominal pain. Sitting in chair eating solid food. Wants chair alarm turned off.  Objective: Vital signs: Vitals:   10/02/23 2106 10/03/23 0444  BP: 123/67 117/73  Pulse: (!) 103 82  Resp: 18 19  Temp: 98.1 F (36.7 C) (!) 97.5 F (36.4 C)  SpO2: 100% 99%    Physical Exam: Gen: lethargic, no acute distress, pleasant, well-nourished HEENT: anicteric sclera CV: RRR Chest: CTA B Abd: soft, nontender, nondistended, +BS Ext: no edema  Lab Results: Recent Labs    10/02/23 0501 10/03/23 0431  NA 138 139  K 4.3 4.0  CL 113* 112*  CO2 19* 24  GLUCOSE 121* 117*  BUN 13 12  CREATININE 0.64 0.74  CALCIUM 8.5* 8.7*  MG 2.2 2.2   Recent Labs    09/30/23 2036 10/01/23 0756  AST 22 23  ALT 19 20  ALKPHOS 57 55  BILITOT 0.4 0.7  PROT 6.0* 6.2*  ALBUMIN 3.4* 3.6   Recent Labs    10/02/23 0501 10/03/23 0431  WBC 7.3 8.0  NEUTROABS 4.1 4.5  HGB 9.1* 8.9*  HCT 27.2* 25.6*  MCV 93.2 92.8  PLT 228 209      Assessment/Plan: Resolving GI bled - red stool yesterday without significant drop in Hgb. Conservative management. Ok to go home today and will need repeat CBC late next week. F/U with [facility] in 4-6 weeks. Will sign off. Call if questions.   Shirley Friar 10/03/2023, 8:24 AM  Questions please call [phone] ID: Sarah [name], female   DOB: 1951-08-26, 72 y.o.   MRN: [id number]

## 2023-10-03 NOTE — Discharge Summary (Signed)
Physician Discharge Summary  Sarah [name] ZOX:[id number] DOB: 21-May-1951 DOA: 09/30/2023  PCP: Assunta Found, MD  Admit date: 09/30/2023 Discharge date: 10/03/2023  Admitted From: Home Disposition: Home  Recommendations for Outpatient Follow-up:  Follow up with PCP in 1 week with repeat CBC/BMP Outpatient follow-up with GI and neurology Follow up in ED if symptoms worsen or new appear   Home Health: Home health PT Equipment/Devices: None  Discharge Condition: Stable CODE STATUS: Full Diet recommendation: Heart healthy  Brief/Interim Summary:  72 y.o. female with medical history significant of Nissen fundoplication, iron deficiency anemia, rectal bleed, HTN, GERD, Hypothyroidism, HLD presented with rectal bleeding and subsequently developed slurred speech in the ED. On presentation, hemoglobin was 11.  Code stroke was activated and she was evaluated by teleneurology.  CT head and CTA head and neck were negative for acute intracranial abnormality and LVO.  GI was consulted.  Initial plan was to transfer her to [facility] for neurology evaluation and follow-up but since MRI of brain was negative for acute stroke.  Transfer was canceled.  Neurology recommended outpatient follow-up with neurology and start baby aspirin once cleared by GI.  Subsequently, bleeding has stopped.  GI has cleared the patient for discharge home today.  Discharge patient home today with outpatient follow-up with PCP and neurology and GI.  Discharge Diagnoses:   Possible lower GI bleeding presenting with rectal bleeding -Managed conservatively.  GI following.  Hemoglobin 8.9 this morning.  No bleeding since yesterday.  Tolerating advanced diet. -GI has cleared the patient for discharge home today.  Discharge patient home today with outpatient follow-up with PCP and GI.  Outpatient follow-up of CBC.  Possible TIA presenting with slurred speech -Symptoms have resolved.  Workup negative so far including CT head  and CTA head and neck were negative for acute intracranial abnormality and LVO; MRI of brain was negative for acute stroke. -Echo showed EF of 55 to 60% with grade 1 diastolic dysfunction -LDL 100.  A1c 6.2.  Neurology recommends to consider Lipitor 20 mg.  Start the same.  Neurology also recommends to start aspirin 81 mg daily once cleared by GI.  Outpatient follow-up with [facility] neurology.  GI has cleared the patient to start aspirin 81 mg daily.  Start aspirin and Lipitor on discharge. -Tolerating diet.  Ambulating well.  PT recommended home health PT.  Hypothyroidism -Continue levothyroxine   Anxiety -Continue as needed alprazolam   Hypertension -blood pressure intermittently on the lower side.  Outpatient follow-up   IBS -Continue tenapanor.  Outpatient follow-up with GI   Hypokalemia -Resolved   Acute metabolic acidosis -Resolved   Discharge Instructions  Discharge Instructions     Ambulatory referral to Neurology   Complete by: As directed    An appointment is requested in approximately: 2 weeks   Diet - low sodium heart healthy   Complete by: As directed    Increase activity slowly   Complete by: As directed    No wound care   Complete by: As directed       Allergies as of 10/03/2023       Reactions   Penicillins Itching, Other (See Comments)   Tolerated Cephalosporins 04/24/2020. Has patient had a PCN reaction causing immediate rash, facial/tongue/throat swelling, SOB or lightheadedness with hypotension: Yes Has patient had a PCN reaction causing severe rash involving mucus membranes or skin necrosis: No Has patient had a PCN reaction that required hospitalization No Has patient had a PCN reaction occurring within the last 10  years: No Patient states allergy noted in the 1970s.        Medication List     TAKE these medications    acetaminophen 650 MG CR tablet Commonly known as: TYLENOL Take 1,300 mg by mouth every 8 (eight) hours as needed  (pain.).   ALPRAZolam 0.5 MG tablet Commonly known as: XANAX Take 0.5 mg by mouth at bedtime.   aspirin EC 81 MG tablet Take 1 tablet (81 mg total) by mouth daily. Swallow whole.   atorvastatin 20 MG tablet Commonly known as: Lipitor Take 1 tablet (20 mg total) by mouth daily.   cyclobenzaprine 10 MG tablet Commonly known as: FLEXERIL Take 10 mg by mouth 3 (three) times daily as needed for muscle spasms.   esomeprazole 20 MG capsule Commonly known as: NEXIUM Take 20 mg by mouth daily before breakfast.   furosemide 40 MG tablet Commonly known as: LASIX Take 40 mg by mouth daily as needed.   Ibsrela 50 MG Tabs Generic drug: Tenapanor HCl Take 1 tablet by mouth 2 (two) times daily.   phentermine 37.5 MG tablet Commonly known as: ADIPEX-P Take 18.75 mg by mouth every morning.   Synthroid 50 MCG tablet Generic drug: levothyroxine Take 50 mcg by mouth daily before breakfast.   traMADol 50 MG tablet Commonly known as: ULTRAM Take 50 mg by mouth every 4 (four) hours as needed (pain.).        Follow-up Information     Assunta Found, MD. Schedule an appointment as soon as possible for a visit in 1 week(s).   Specialty: Family Medicine Why: With repeat CBC/BMP Contact information: [address] [place] [address] [phone]         Charlott Rakes, MD. Schedule an appointment as soon as possible for a visit in 1 week(s).   Specialty: Gastroenterology Contact information: [address] [place] [address] [phone]                Allergies  Allergen Reactions   Penicillins Itching and Other (See Comments)    Tolerated Cephalosporins 04/24/2020.  Has patient had a PCN reaction causing immediate rash, facial/tongue/throat swelling, SOB or lightheadedness with hypotension: Yes Has patient had a PCN reaction causing severe rash involving mucus membranes or skin necrosis: No Has patient had a PCN reaction that required  hospitalization No Has patient had a PCN reaction occurring within the last 10 years: No  Patient states allergy noted in the 1970s.      Consultations: Neurology/GI   Procedures/Studies: ECHOCARDIOGRAM COMPLETE Result Date: 10/01/2023    ECHOCARDIOGRAM REPORT   Patient Name:   KERISHA BLOXOM Date of Exam: 10/01/2023 Medical Rec #:  [id number]       Height:       62.0 in Accession #:    [id number]      Weight:       134.0 lb Date of Birth:  02/14/1951       BSA:          1.612 m Patient Age:    72 years        BP:           127/72 mmHg Patient Gender: F               HR:           91 bpm. Exam Location:  Inpatient Procedure: 2D Echo, Cardiac Doppler and Color Doppler Indications:    TIA  History:  Patient has prior history of Echocardiogram examinations, most                 recent 01/31/2016. Risk Factors:Hypertension and Dyslipidemia.  Sonographer:    Karma Ganja Referring Phys: [id number] CHING T TU IMPRESSIONS  1. Left ventricular ejection fraction, by estimation, is 55 to 60%. The left ventricle has normal function. The left ventricle has no regional wall motion abnormalities. There is mild left ventricular hypertrophy. Left ventricular diastolic parameters are consistent with Grade I diastolic dysfunction (impaired relaxation).  2. Right ventricular systolic function is normal. The right ventricular size is normal.  3. The mitral valve is normal in structure. No evidence of mitral valve regurgitation. No evidence of mitral stenosis.  4. The aortic valve is tricuspid. Aortic valve regurgitation is not visualized. Aortic valve sclerosis is present, with no evidence of aortic valve stenosis.  5. The inferior vena cava is normal in size with greater than 50% respiratory variability, suggesting right atrial pressure of 3 mmHg. Comparison(s): No prior Echocardiogram. FINDINGS  Left Ventricle: Left ventricular ejection fraction, by estimation, is 55 to 60%. The left ventricle has normal function. The  left ventricle has no regional wall motion abnormalities. The left ventricular internal cavity size was normal in size. There is  mild left ventricular hypertrophy. Left ventricular diastolic parameters are consistent with Grade I diastolic dysfunction (impaired relaxation). Right Ventricle: The right ventricular size is normal. Right ventricular systolic function is normal. Left Atrium: Left atrial size was normal in size. Right Atrium: Right atrial size was normal in size. Pericardium: There is no evidence of pericardial effusion. Mitral Valve: The mitral valve is normal in structure. No evidence of mitral valve regurgitation. No evidence of mitral valve stenosis. Tricuspid Valve: The tricuspid valve is normal in structure. Tricuspid valve regurgitation is not demonstrated. No evidence of tricuspid stenosis. Aortic Valve: The aortic valve is tricuspid. Aortic valve regurgitation is not visualized. Aortic valve sclerosis is present, with no evidence of aortic valve stenosis. Aortic valve mean gradient measures 3.0 mmHg. Aortic valve peak gradient measures 5.7  mmHg. Aortic valve area, by VTI measures 2.12 cm. Pulmonic Valve: The pulmonic valve was normal in structure. Pulmonic valve regurgitation is trivial. No evidence of pulmonic stenosis. Aorta: The aortic root is normal in size and structure. Venous: The inferior vena cava is normal in size with greater than 50% respiratory variability, suggesting right atrial pressure of 3 mmHg. IAS/Shunts: The interatrial septum was not well visualized.  LEFT VENTRICLE PLAX 2D LVIDd:         3.70 cm     Diastology LVIDs:         2.90 cm     LV e' medial:    5.00 cm/s LV PW:         1.30 cm     LV E/e' medial:  11.6 LV IVS:        1.10 cm     LV e' lateral:   10.10 cm/s LVOT diam:     2.00 cm     LV E/e' lateral: 5.7 LV SV:         46 LV SV Index:   28 LVOT Area:     3.14 cm  LV Volumes (MOD) LV vol d, MOD A2C: 40.4 ml LV vol d, MOD A4C: 71.2 ml LV vol s, MOD A2C: 21.2 ml LV  vol s, MOD A4C: 32.6 ml LV SV MOD A2C:     19.2 ml LV SV MOD A4C:  71.2 ml LV SV MOD BP:      30.1 ml RIGHT VENTRICLE             IVC RV Basal diam:  3.60 cm     IVC diam: 1.70 cm RV S prime:     17.20 cm/s TAPSE (M-mode): 2.3 cm LEFT ATRIUM             Index        RIGHT ATRIUM           Index LA diam:        4.10 cm 2.54 cm/m   RA Area:     15.80 cm LA Vol (A2C):   28.9 ml 17.92 ml/m  RA Volume:   40.90 ml  25.36 ml/m LA Vol (A4C):   40.5 ml 25.12 ml/m LA Biplane Vol: 35.5 ml 22.02 ml/m  AORTIC VALVE AV Area (Vmax):    1.95 cm AV Area (Vmean):   1.75 cm AV Area (VTI):     2.12 cm AV Vmax:           119.00 cm/s AV Vmean:          85.400 cm/s AV VTI:            0.216 m AV Peak Grad:      5.7 mmHg AV Mean Grad:      3.0 mmHg LVOT Vmax:         73.80 cm/s LVOT Vmean:        47.600 cm/s LVOT VTI:          0.146 m LVOT/AV VTI ratio: 0.68  AORTA Ao Root diam: 3.40 cm Ao Asc diam:  3.20 cm MITRAL VALVE MV Area (PHT): 5.88 cm    SHUNTS MV Decel Time: 129 msec    Systemic VTI:  0.15 m MV E velocity: 57.80 cm/s  Systemic Diam: 2.00 cm MV A velocity: 91.70 cm/s MV E/A ratio:  0.63 Olga Millers MD Electronically signed by Olga Millers MD Signature Date/Time: 10/01/2023/9:41:27 AM    Final    MR BRAIN WO CONTRAST Result Date: 10/01/2023 CLINICAL DATA:  Loss of consciousness, dysarthria EXAM: MRI HEAD WITHOUT CONTRAST TECHNIQUE: Multiplanar, multiecho pulse sequences of the brain and surrounding structures were obtained without intravenous contrast. COMPARISON:  09/30/2023 CT head, 04/21/2017 MRI head FINDINGS: Brain: No restricted diffusion to suggest acute or subacute infarct. No acute hemorrhage, mass, mass effect, or midline shift. No hydrocephalus or extra-axial collection. Pituitary and craniocervical junction within normal limits. No hemosiderin deposition to suggest remote hemorrhage. Advanced cerebral atrophy for age. Confluent T2 hyperintense signal in the periventricular white matter and pons,  likely the sequela of moderate chronic small vessel ischemic disease. Vascular: Normal arterial flow voids. Skull and upper cervical spine: Normal marrow signal. Sinuses/Orbits: Clear paranasal sinuses. No acute finding in the orbits. Other: The mastoid air cells are well aerated. IMPRESSION: No acute intracranial process. No evidence of acute or subacute infarct. Electronically Signed   By: Wiliam Ke M.D.   On: 10/01/2023 03:43   CT ANGIO HEAD NECK W WO CM W PERF (CODE STROKE) Result Date: 09/30/2023 CLINICAL DATA:  Left-sided weakness in arm and leg EXAM: CT ANGIOGRAPHY HEAD AND NECK CT PERFUSION BRAIN TECHNIQUE: Multidetector CT imaging of the head and neck was performed using the standard protocol during bolus administration of intravenous contrast. Multiplanar CT image reconstructions and MIPs were obtained to evaluate the vascular anatomy. Carotid stenosis measurements (when applicable) are obtained utilizing NASCET criteria, using the  distal internal carotid diameter as the denominator. Multiphase CT imaging of the brain was performed following IV bolus contrast injection. Subsequent parametric perfusion maps were calculated using RAPID software. RADIATION DOSE REDUCTION: This exam was performed according to the departmental dose-optimization program which includes automated exposure control, adjustment of the mA and/or kV according to patient size and/or use of iterative reconstruction technique. CONTRAST:  OMNIPAQUE IOHEXOL 350 MG/ML SOLN COMPARISON:  CT head 09/30/2023, MRA head 01/31/2016 FINDINGS: CT HEAD FINDINGS For noncontrast findings, please see same day CT head. CTA NECK FINDINGS Aortic arch: Two-vessel arch with a common origin of the brachiocephalic and left common carotid arteries. Imaged portion shows no evidence of aneurysm or dissection. No significant stenosis of the major arch vessel origins. Right carotid system: No evidence of dissection, occlusion, or hemodynamically  significant stenosis (greater than 50%). Left carotid system: No evidence of dissection, occlusion, or hemodynamically significant stenosis (greater than 50%). Vertebral arteries: No evidence of dissection, occlusion, or hemodynamically significant stenosis (greater than 50%). Skeleton: No acute osseous abnormality. Degenerative changes in the cervical spine. Other neck: No acute finding. Upper chest: No focal pulmonary opacity or pleural effusion. Review of the MIP images confirms the above findings CTA HEAD FINDINGS Anterior circulation: Both internal carotid arteries are patent to the termini, without significant stenosis. A1 segments patent. Normal anterior communicating artery. Anterior cerebral arteries are patent to their distal aspects without significant stenosis. No M1 stenosis or occlusion. MCA branches perfused to their distal aspects without significant stenosis. Posterior circulation: Vertebral arteries patent to the vertebrobasilar junction without significant stenosis. Posterior inferior cerebellar arteries patent proximally. Basilar patent to its distal aspect without significant stenosis. Superior cerebellar arteries patent proximally. Patent P1 segments. PCAs perfused to their distal aspects without significant stenosis. Venous sinuses: As permitted by contrast timing, patent. Anatomic variants: None significant. No evidence of aneurysm or vascular malformation. Review of the MIP images confirms the above findings IMPRESSION: 1. No intracranial large vessel occlusion or significant stenosis. 2. No hemodynamically significant stenosis in the neck. 3. No perfusion abnormality. Electronically Signed   By: Wiliam Ke M.D.   On: 09/30/2023 22:08   CT HEAD CODE STROKE WO CONTRAST Result Date: 09/30/2023 CLINICAL DATA:  Code stroke. EXAM: CT HEAD WITHOUT CONTRAST TECHNIQUE: Contiguous axial images were obtained from the base of the skull through the vertex without intravenous contrast. RADIATION  DOSE REDUCTION: This exam was performed according to the departmental dose-optimization program which includes automated exposure control, adjustment of the mA and/or kV according to patient size and/or use of iterative reconstruction technique. COMPARISON:  09/30/2019 FINDINGS: Brain: No evidence of acute infarction, hemorrhage, mass, mass effect, or midline shift. No hydrocephalus or extra-axial collection. Periventricular white matter changes, likely the sequela of chronic small vessel ischemic disease. Advanced cerebral atrophy for age. Vascular: No hyperdense vessel. Atherosclerotic calcifications in the intracranial carotid and vertebral arteries. Skull: Negative for fracture or focal lesion. Sinuses/Orbits: No acute finding. Other: The mastoid air cells are well aerated. ASPECTS [facility] Stroke Program Early CT Score) - Ganglionic level infarction (caudate, lentiform nuclei, internal capsule, insula, M1-M3 cortex): 7 - Supraganglionic infarction (M4-M6 cortex): 3 Total score (0-10 with 10 being normal): 10 IMPRESSION: No acute intracranial process. ASPECTS is 10. Code stroke imaging results were communicated on 09/30/2023 at 9:00 pm to provider Groce via telephone, who verbally acknowledged these results. Electronically Signed   By: Wiliam Ke M.D.   On: 09/30/2023 21:00   EXERCISE TOLERANCE TEST (ETT) Result Date: 09/30/2023  Exercise stress test:  Clinically and electrically negative for ischemia.      Subjective: Patient seen and examined at bedside.  No reactivating since yesterday.  Feels okay to go home today.  Denies fever, vomiting, abdominal pain.  Discharge Exam: Vitals:   10/02/23 2106 10/03/23 0444  BP: 123/67 117/73  Pulse: (!) 103 82  Resp: 18 19  Temp: 98.1 F (36.7 C) (!) 97.5 F (36.4 C)  SpO2: 100% 99%    General: Pt is alert, awake, not in acute distress.  On room air. Cardiovascular: rate controlled, S1/S2 + Respiratory: bilateral decreased breath sounds at  bases Abdominal: Soft, NT, ND, bowel sounds + Extremities: no edema, no cyanosis    The results of significant diagnostics from this hospitalization (including imaging, microbiology, ancillary and laboratory) are listed below for reference.     Microbiology: No results found for this or any previous visit (from the past 240 hours).   Labs: BNP (last 3 results) No results for input(s): "BNP" in the last 8760 hours. Basic Metabolic Panel: Recent Labs  Lab 09/30/23 2036 10/01/23 0756 10/02/23 0501 10/03/23 0431  NA 137 139 138 139  K 3.0* 3.0* 4.3 4.0  CL 110 112* 113* 112*  CO2 20* 19* 19* 24  GLUCOSE 171* 121* 121* 117*  BUN 23 19 13 12   CREATININE 0.72 0.64 0.64 0.74  CALCIUM 8.6* 8.5* 8.5* 8.7*  MG  --  2.1 2.2 2.2   Liver Function Tests: Recent Labs  Lab 09/30/23 2036 10/01/23 0756  AST 22 23  ALT 19 20  ALKPHOS 57 55  BILITOT 0.4 0.7  PROT 6.0* 6.2*  ALBUMIN 3.4* 3.6   No results for input(s): "LIPASE", "AMYLASE" in the last 168 hours. No results for input(s): "AMMONIA" in the last 168 hours. CBC: Recent Labs  Lab 09/30/23 2036 10/01/23 0756 10/02/23 0501 10/03/23 0431  WBC 11.1* 8.5 7.3 8.0  NEUTROABS 8.0* 5.4 4.1 4.5  HGB 11.0* 10.3* 9.1* 8.9*  HCT 30.5* 28.4* 27.2* 25.6*  MCV 89.2 89.9 93.2 92.8  PLT 251 223 228 209   Cardiac Enzymes: No results for input(s): "CKTOTAL", "CKMB", "CKMBINDEX", "TROPONINI" in the last 168 hours. BNP: Invalid input(s): "POCBNP" CBG: No results for input(s): "GLUCAP" in the last 168 hours. D-Dimer No results for input(s): "DDIMER" in the last 72 hours. Hgb A1c Recent Labs    10/01/23 0432  HGBA1C 6.2*   Lipid Profile Recent Labs    10/01/23 0432  CHOL 152  HDL 39*  LDLCALC 100*  TRIG 66  CHOLHDL 3.9   Thyroid function studies No results for input(s): "TSH", "T4TOTAL", "T3FREE", "THYROIDAB" in the last 72 hours.  Invalid input(s): "FREET3" Anemia work up No results for input(s): "VITAMINB12",  "FOLATE", "FERRITIN", "TIBC", "IRON", "RETICCTPCT" in the last 72 hours. Urinalysis    Component Value Date/Time   COLORURINE YELLOW 09/30/2023 2200   APPEARANCEUR CLEAR 09/30/2023 2200   LABSPEC 1.024 09/30/2023 2200   PHURINE 6.0 09/30/2023 2200   GLUCOSEU NEGATIVE 09/30/2023 2200   HGBUR MODERATE (A) 09/30/2023 2200   BILIRUBINUR NEGATIVE 09/30/2023 2200   KETONESUR 5 (A) 09/30/2023 2200   PROTEINUR NEGATIVE 09/30/2023 2200   UROBILINOGEN 0.2 06/01/2009 1910   NITRITE NEGATIVE 09/30/2023 2200   LEUKOCYTESUR NEGATIVE 09/30/2023 2200   Sepsis Labs Recent Labs  Lab 09/30/23 2036 10/01/23 0756 10/02/23 0501 10/03/23 0431  WBC 11.1* 8.5 7.3 8.0   Microbiology No results found for this or any previous visit (from the past 240 hours).  Time coordinating discharge: 35 minutes  SIGNED:   Glade Lloyd, MD  [facility] 10/03/2023, 10:09 AM

## 2023-10-03 NOTE — Progress Notes (Signed)
AVS reviewed w/ pt and son - both verbalized an understanding. PIV removed as noted. Central tele notified by this RN of pt d/c to home. Pt dressed - d/c to home w/ son- to lobby via w/c

## 2023-10-03 NOTE — TOC Transition Note (Signed)
Transition of Care [facility]) - Discharge Note   Patient Details  Name: Sarah [name] MRN: [id number] Date of Birth: 03/24/1951  [facility] CM/SW Contact:  Adrian Prows, RN Phone Number: 10/03/2023, 10:42 AM   Clinical Narrative:    Orders for HHPT and d/c received; spoke w/ pt in room; she agrees to receive recc service; she does not have an agency preference; pt confirmed d/c address: [address] [place], [place] [address]; spoke w/ Mayra Reel at [facility]; she says agency can provide service; pt notified; agency contact info placed in follow up provider section of d/c instructions; no TOC needs.   Final next level of care: Home w Home Health Services Barriers to Discharge: No Barriers Identified   Patient Goals and CMS Choice Patient states their goals for this hospitalization and ongoing recovery are:: home CMS Medicare.gov Compare Post Acute Care list provided to:: Patient   [place] ownership interest in [facility].provided to:: Patient    Discharge Placement                       Discharge Plan and Services Additional resources added to the After Visit Summary for     Discharge Planning Services: CM Consult            DME Arranged: N/A DME Agency: NA       HH Arranged: PT HH Agency: [facility] Date [facility] Agency Contacted: 10/04/23 Time HH Agency Contacted: 1042 Representative spoke with at [facility] Agency: Amy Hyatt  Social Drivers of Health (SDOH) Interventions SDOH Screenings   Food Insecurity: No Food Insecurity (10/02/2023)  Recent Concern: Food Insecurity - Food Insecurity Present (10/01/2023)  Housing: Low Risk  (10/02/2023)  Transportation Needs: No Transportation Needs (10/02/2023)  Utilities: Not At Risk (10/02/2023)  Alcohol Screen: Low Risk  (11/05/2022)  Depression (PHQ2-9): Low Risk  (05/20/2023)  Financial Resource Strain: Low Risk  (11/05/2022)  Physical Activity: Insufficiently Active (11/05/2022)   Social Connections: Moderately Integrated (11/05/2022)  Stress: No Stress Concern Present (11/05/2022)  Tobacco Use: Low Risk  (10/01/2023)     Readmission Risk Interventions     No data to display

## 2023-10-04 ENCOUNTER — Other Ambulatory Visit: Payer: Self-pay

## 2023-10-04 DIAGNOSIS — E785 Hyperlipidemia, unspecified: Secondary | ICD-10-CM

## 2023-10-07 ENCOUNTER — Encounter: Payer: Self-pay | Admitting: Hematology

## 2023-10-08 DIAGNOSIS — E876 Hypokalemia: Secondary | ICD-10-CM | POA: Diagnosis not present

## 2023-10-08 DIAGNOSIS — Z6824 Body mass index (BMI) 24.0-24.9, adult: Secondary | ICD-10-CM | POA: Diagnosis not present

## 2023-10-08 DIAGNOSIS — K922 Gastrointestinal hemorrhage, unspecified: Secondary | ICD-10-CM | POA: Diagnosis not present

## 2023-10-08 DIAGNOSIS — E039 Hypothyroidism, unspecified: Secondary | ICD-10-CM | POA: Diagnosis not present

## 2023-10-08 DIAGNOSIS — G459 Transient cerebral ischemic attack, unspecified: Secondary | ICD-10-CM | POA: Diagnosis not present

## 2023-10-11 ENCOUNTER — Encounter: Payer: Self-pay | Admitting: Hematology

## 2023-10-11 NOTE — Progress Notes (Signed)
 Cardiology Office Note:  .   Date:  10/12/2023  ID:  Sarah [name], DOB 06-29-1951, MRN [id number] PCP: Marvine Rush, MD  [facility] Providers Cardiologist: Lynwood Schilling, MD { History of Present Illness: .   Sarah [name] is a 73 y.o. female with history of iron  deficiency anemia history of Nissen fundoplication, dyslipidemia, hypertension, hypothyroidism, rectal bleeding, and shortness of breath on exertion.   She had GI bleed over a few days prior to syncopal episode leading her to call EMS.  Recent admission between 09/30/2023 and 10/03/2023 for what appeared to be CVA.  On admission hemoglobin was 8.9, hematocrit 25.6.  She was seen by GI but colonoscopy and endoscopy were not performed at that time.  She is to follow-up with them on October 16, 2023. ]  She has had IV iron  infusions, as she did not tolerate po intake. She was diagnosed with acute CVA in the setting o slurred speech . MR was negative for acute stroke. She was to follow up with neurology.    She comes today short of breath, panting, also telling me that she continues to have some mild bleeding from her rectum.  She denies any chest pain, abdominal pain, dizziness, or palpitations.  She is due to follow-up with GI in 3 days, neurology tomorrow.  She states the bleeding is light but continuous and she has noticed that her breathing is gotten worse over the last couple of days.  She has been taking Xanax  to help her to rest at night,  ROS: As above otherwise negative.  Studies Reviewed: SABRA   EKG Interpretation Date/Time:  Tuesday October 12 2023 14:20:34 EST Ventricular Rate:  93 PR Interval:  156 QRS Duration:  86 QT Interval:  382 QTC Calculation: 474 R Axis:   -37  Text Interpretation: Normal sinus rhythm Left axis deviation Cannot rule out Anterior infarct , age undetermined When compared with ECG of 30-Sep-2023 20:11, PREVIOUS ECG IS PRESENT Confirmed by Jerilynn Collar [phone]) on 10/12/2023 2:39:13 PM    Echocardiogram 10/01/2023 1. Left ventricular ejection fraction, by estimation, is 55 to 60%. The  left ventricle has normal function. The left ventricle has no regional  wall motion abnormalities. There is mild left ventricular hypertrophy.  Left ventricular diastolic parameters  are consistent with Grade I diastolic dysfunction (impaired relaxation).   2. Right ventricular systolic function is normal. The right ventricular  size is normal.   3. The mitral valve is normal in structure. No evidence of mitral valve  regurgitation. No evidence of mitral stenosis.   4. The aortic valve is tricuspid. Aortic valve regurgitation is not  visualized. Aortic valve sclerosis is present, with no evidence of aortic  valve stenosis.   5. The inferior vena cava is normal in size with greater than 50%  respiratory variability, suggesting right atrial pressure of 3 mmHg.    Physical Exam:   VS:  BP 112/74 (BP Location: Left Arm, Patient Position: Sitting, Cuff Size: Normal)   Pulse 93   Ht 5' 2 (1.575 m)   Wt 125 lb 12.8 oz (57.1 kg)   SpO2 93%   BMI 23.01 kg/m    Wt Readings from Last 3 Encounters:  10/12/23 125 lb 12.8 oz (57.1 kg)  09/30/23 134 lb (60.8 kg)  09/09/23 134 lb (60.8 kg)    GEN: Well nourished, well developed in no acute distress NECK: No JVD; No carotid bruits CARDIAC: RRR, tachycardic, no murmurs, rubs, gallops RESPIRATORY:  Clear to  auscultation without rales, wheezing or rhonchi tachypneic ABDOMEN: Soft, non-tender, non-distended EXTREMITIES:  No edema; No deformity   ASSESSMENT AND PLAN: .    Hypertension: Blood pressure is soft today but she is not syncopal or dizzy.  She is not on any antihypertensives at this time.  Continue to follow with PCP.  2.  GI bleed: Followed by GI due to see them in 3 days.  She states she still has a little bit of bleeding from her rectum.  Will do a CBC today if significantly low she will need to be seen in the ED otherwise she should  be seen by GI sooner than previously scheduled appointment especially if her bleeding becomes worse.  3.  Iron  deficiency anemia: She is getting blood transfusions and is supposed to be seen at the cancer center on January 9 for labs and transfusion.  4.  TIA: She continues to have some bleeding from her rectum.  I would hold aspirin  81 mg temporarily until she is seen by GI.         Signed, Lamarr HERO. Jerilynn CHOL, ANP, AACC
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
## 2023-10-12 ENCOUNTER — Encounter: Payer: Self-pay | Admitting: Adult Health

## 2023-10-12 ENCOUNTER — Ambulatory Visit: Payer: Medicare HMO | Attending: Adult Health | Admitting: Adult Health

## 2023-10-12 VITALS — BP 112/74 | HR 93 | Ht 62.0 in | Wt 125.8 lb

## 2023-10-12 DIAGNOSIS — R0600 Dyspnea, unspecified: Secondary | ICD-10-CM

## 2023-10-12 NOTE — Patient Instructions (Signed)
 Medication Instructions:  Stop Aspirin  *If you need a refill on your cardiac medications before your next appointment, please call your pharmacy*   Lab Work: CBC  If you have labs (blood work) drawn today and your tests are completely normal, you will receive your results only by: MyChart Message (if you have MyChart) OR A paper copy in the mail If you have any lab test that is abnormal or we need to change your treatment, we will call you to review the results.   Testing/Procedures: No Testing   Follow-Up: At [facility], you and your health needs are our priority.  As part of our continuing mission to provide you with exceptional heart care, we have created designated Provider Care Teams.  These Care Teams include your primary Cardiologist (physician) and Advanced Practice Providers (APPs -  Physician Assistants and Nurse Practitioners) who all work together to provide you with the care you need, when you need it.  We recommend signing up for the patient portal called MyChart.  Sign up information is provided on this After Visit Summary.  MyChart is used to connect with patients for Virtual Visits (Telemedicine).  Patients are able to view lab/test results, encounter notes, upcoming appointments, etc.  Non-urgent messages can be sent to your provider as well.   To learn more about what you can do with MyChart, go to [email or web address].    Your next appointment:   6 month(s)  Provider:   Lynwood Schilling, MD

## 2023-10-12 NOTE — Patient Instructions (Signed)
 Below is our plan:  Possible TIA: Residual deficit: questionable left sided weaknes. Continue atorvastatin  20mg  daily for secondary stroke prevention. Hold aspirin  for now until evaluated by GI. Once GI bleeding controlled, consider resuming asa 81mg  for stroke prevention. Discussed secondary stroke prevention measures and importance of close PCP follow up for aggressive stroke risk factor management. I have gone over the pathophysiology of stroke, warning signs and symptoms, risk factors and their management in some detail with instructions to go to the closest emergency room for symptoms of concern. HTN: BP goal <130/90. 119/76 today. No HTN agents. Continue to monitor per PCP HLD: LDL goal <70. Recent LDL 100. Continue atorvastatin  20mg  daily.  DMII: A1c goal<7.0. Recent A1c 6.2. Prediabetes. Continue healthy, well balanced diet and regular exercise. Continue to monitor per PCP.  Anemia: suspected d/t GI bleed. Please see GI today as scheduled. Proceed to ER for any red flag warning of worsening shortness of breath, chest pain, concerns of syncope, etc.  Left sided weakness: continue to work with PT once GI bleed is evaluated.   Goals:  1) Maintain strict control of hypertension with blood pressure goal below 130/90 2) Maintain good control of diabetes with hemoglobin A1c goal below 7%  3) Maintain good control of lipids with LDL cholesterol goal below 70 mg/dL.  4) Eat a healthy diet with plenty of whole grains, cereals, fruits and vegetables, exercise regularly and maintain ideal body weight   Resources: https://www.williams.biz/  Please make sure you are staying well hydrated. I recommend 50-60 ounces daily. Well balanced diet and regular exercise encouraged. Consistent sleep schedule with 6-8 hours recommended.   Please continue follow up with care team as directed.   Follow up with me in 6 months   You may receive a  survey regarding today's visit. I encourage you to leave honest feed back as I do use this information to improve patient care. Thank you for seeing me today!

## 2023-10-13 ENCOUNTER — Ambulatory Visit: Payer: Medicare HMO | Admitting: Family Medicine

## 2023-10-13 ENCOUNTER — Observation Stay (HOSPITAL_COMMUNITY)
Admission: EM | Admit: 2023-10-13 | Discharge: 2023-10-15 | Disposition: A | Discharge status: Home / Self Care | Payer: Medicare HMO | Attending: Internal Medicine | Admitting: Internal Medicine

## 2023-10-13 ENCOUNTER — Emergency Department (HOSPITAL_COMMUNITY): Payer: Medicare HMO

## 2023-10-13 ENCOUNTER — Encounter: Payer: Self-pay | Admitting: Family Medicine

## 2023-10-13 ENCOUNTER — Other Ambulatory Visit: Payer: Self-pay

## 2023-10-13 ENCOUNTER — Encounter (HOSPITAL_COMMUNITY): Payer: Self-pay | Admitting: Emergency Medicine

## 2023-10-13 ENCOUNTER — Encounter: Payer: Self-pay | Admitting: Hematology

## 2023-10-13 ENCOUNTER — Telehealth: Payer: Self-pay

## 2023-10-13 VITALS — BP 119/76 | HR 105 | Ht 62.0 in | Wt 136.5 lb

## 2023-10-13 DIAGNOSIS — K921 Melena: Secondary | ICD-10-CM

## 2023-10-13 DIAGNOSIS — E039 Hypothyroidism, unspecified: Secondary | ICD-10-CM | POA: Diagnosis not present

## 2023-10-13 DIAGNOSIS — I10 Essential (primary) hypertension: Secondary | ICD-10-CM | POA: Diagnosis not present

## 2023-10-13 DIAGNOSIS — D649 Anemia, unspecified: Principal | ICD-10-CM

## 2023-10-13 DIAGNOSIS — Z8673 Personal history of transient ischemic attack (TIA), and cerebral infarction without residual deficits: Secondary | ICD-10-CM | POA: Diagnosis not present

## 2023-10-13 DIAGNOSIS — D62 Acute posthemorrhagic anemia: Secondary | ICD-10-CM | POA: Diagnosis not present

## 2023-10-13 DIAGNOSIS — G459 Transient cerebral ischemic attack, unspecified: Secondary | ICD-10-CM | POA: Diagnosis not present

## 2023-10-13 DIAGNOSIS — Z7982 Long term (current) use of aspirin: Secondary | ICD-10-CM | POA: Diagnosis not present

## 2023-10-13 DIAGNOSIS — K2971 Gastritis, unspecified, with bleeding: Secondary | ICD-10-CM | POA: Insufficient documentation

## 2023-10-13 DIAGNOSIS — K7689 Other specified diseases of liver: Secondary | ICD-10-CM | POA: Diagnosis not present

## 2023-10-13 DIAGNOSIS — K922 Gastrointestinal hemorrhage, unspecified: Secondary | ICD-10-CM | POA: Diagnosis not present

## 2023-10-13 DIAGNOSIS — R4781 Slurred speech: Secondary | ICD-10-CM | POA: Diagnosis not present

## 2023-10-13 DIAGNOSIS — R531 Weakness: Secondary | ICD-10-CM

## 2023-10-13 DIAGNOSIS — Z79899 Other long term (current) drug therapy: Secondary | ICD-10-CM | POA: Diagnosis not present

## 2023-10-13 DIAGNOSIS — E876 Hypokalemia: Secondary | ICD-10-CM | POA: Insufficient documentation

## 2023-10-13 DIAGNOSIS — Z96641 Presence of right artificial hip joint: Secondary | ICD-10-CM | POA: Diagnosis not present

## 2023-10-13 DIAGNOSIS — K5730 Diverticulosis of large intestine without perforation or abscess without bleeding: Secondary | ICD-10-CM | POA: Diagnosis not present

## 2023-10-13 DIAGNOSIS — K449 Diaphragmatic hernia without obstruction or gangrene: Secondary | ICD-10-CM | POA: Diagnosis not present

## 2023-10-13 LAB — CBC
HCT: 22.8 % — ABNORMAL LOW (ref 36.0–46.0)
Hematocrit: 23.3 % — ABNORMAL LOW (ref 34.0–46.6)
Hemoglobin: 7.6 g/dL — ABNORMAL LOW (ref 12.0–15.0)
Hemoglobin: 7.7 g/dL — ABNORMAL LOW (ref 11.1–15.9)
MCH: 31.3 pg (ref 26.6–33.0)
MCH: 31.7 pg (ref 26.0–34.0)
MCHC: 33 g/dL (ref 31.5–35.7)
MCHC: 33.3 g/dL (ref 30.0–36.0)
MCV: 95 fL (ref 79–97)
MCV: 95 fL (ref 80.0–100.0)
Platelets: 298 10*3/uL (ref 150–400)
Platelets: 346 10*3/uL (ref 150–450)
RBC: 2.4 MIL/uL — ABNORMAL LOW (ref 3.87–5.11)
RBC: 2.46 x10E6/uL — CL (ref 3.77–5.28)
RDW: 13.3 % (ref 11.7–15.4)
RDW: 15 % (ref 11.5–15.5)
WBC: 7.5 10*3/uL (ref 4.0–10.5)
WBC: 8.8 10*3/uL (ref 3.4–10.8)
nRBC: 0 % (ref 0.0–0.2)

## 2023-10-13 LAB — COMPREHENSIVE METABOLIC PANEL
ALT: 16 U/L (ref 0–44)
AST: 22 U/L (ref 15–41)
Albumin: 3.9 g/dL (ref 3.5–5.0)
Alkaline Phosphatase: 82 U/L (ref 38–126)
Anion gap: 8 (ref 5–15)
BUN: 17 mg/dL (ref 8–23)
CO2: 22 mmol/L (ref 22–32)
Calcium: 8.8 mg/dL — ABNORMAL LOW (ref 8.9–10.3)
Chloride: 109 mmol/L (ref 98–111)
Creatinine, Ser: 0.77 mg/dL (ref 0.44–1.00)
GFR, Estimated: >60 mL/min (ref >60)
Glucose, Bld: 130 mg/dL — ABNORMAL HIGH (ref 70–99)
Potassium: 2.6 mmol/L — CL (ref 3.5–5.1)
Sodium: 139 mmol/L (ref 135–145)
Total Bilirubin: 0.3 mg/dL (ref 0.0–1.2)
Total Protein: 7 g/dL (ref 6.5–8.1)

## 2023-10-13 LAB — MAGNESIUM: Magnesium: 2.1 mg/dL (ref 1.7–2.4)

## 2023-10-13 LAB — PREPARE RBC (CROSSMATCH)

## 2023-10-13 MED ORDER — SENNOSIDES-DOCUSATE SODIUM 8.6-50 MG PO TABS: 1.0000 | ORAL_TABLET | Freq: Every evening | ORAL | Status: DC | PRN

## 2023-10-13 MED ORDER — ALPRAZOLAM 0.5 MG PO TABS
0.5000 mg | ORAL_TABLET | Freq: Every day | ORAL | Status: DC
  Administered 2023-10-14 (×2): 0.5 mg via ORAL
  Filled 2023-10-13 (×2): qty 1

## 2023-10-13 MED ORDER — SODIUM CHLORIDE 0.9 % IV BOLUS
1000.0000 mL | Freq: Once | INTRAVENOUS | Status: AC
  Administered 2023-10-13: 1000 mL via INTRAVENOUS

## 2023-10-13 MED ORDER — ONDANSETRON HCL 4 MG PO TABS: 4.0000 mg | ORAL_TABLET | Freq: Four times a day (QID) | ORAL | Status: DC | PRN

## 2023-10-13 MED ORDER — POTASSIUM CHLORIDE 10 MEQ/100ML IV SOLN
10.0000 meq | INTRAVENOUS | Status: AC
  Administered 2023-10-13 (×4): 10 meq via INTRAVENOUS
  Filled 2023-10-13 (×3): qty 100

## 2023-10-13 MED ORDER — LEVOTHYROXINE SODIUM 75 MCG PO TABS
75.0000 ug | ORAL_TABLET | Freq: Every day | ORAL | Status: DC
  Administered 2023-10-14 – 2023-10-15 (×2): 75 ug via ORAL
  Filled 2023-10-13 (×2): qty 1

## 2023-10-13 MED ORDER — POTASSIUM CHLORIDE CRYS ER 20 MEQ PO TBCR
40.0000 meq | EXTENDED_RELEASE_TABLET | Freq: Once | ORAL | Status: AC
  Administered 2023-10-14: 40 meq via ORAL
  Filled 2023-10-13: qty 2

## 2023-10-13 MED ORDER — ACETAMINOPHEN 325 MG PO TABS
650.0000 mg | ORAL_TABLET | Freq: Four times a day (QID) | ORAL | Status: DC | PRN
  Administered 2023-10-14 – 2023-10-15 (×3): 650 mg via ORAL
  Filled 2023-10-13 (×3): qty 2

## 2023-10-13 MED ORDER — ONDANSETRON HCL 4 MG/2ML IJ SOLN: 4.0000 mg | Freq: Four times a day (QID) | INTRAMUSCULAR | Status: DC | PRN

## 2023-10-13 MED ORDER — PHENTERMINE HCL 37.5 MG PO TABS: 18.7500 mg | ORAL_TABLET | Freq: Every morning | ORAL | Status: DC

## 2023-10-13 MED ORDER — PANTOPRAZOLE SODIUM 40 MG PO TBEC: 40.0000 mg | DELAYED_RELEASE_TABLET | Freq: Every day | ORAL | Status: DC

## 2023-10-13 MED ORDER — MORPHINE SULFATE (PF) 4 MG/ML IV SOLN
4.0000 mg | Freq: Once | INTRAVENOUS | Status: AC
  Administered 2023-10-13: 4 mg via INTRAVENOUS
  Filled 2023-10-13: qty 1

## 2023-10-13 MED ORDER — HYDROCODONE-ACETAMINOPHEN 5-325 MG PO TABS
1.0000 | ORAL_TABLET | Freq: Once | ORAL | Status: AC
Start: 2023-10-13 — End: 2023-10-13
  Administered 2023-10-13: 1 via ORAL
  Filled 2023-10-13: qty 1

## 2023-10-13 MED ORDER — IOHEXOL 300 MG/ML  SOLN
100.0000 mL | Freq: Once | INTRAMUSCULAR | Status: AC | PRN
  Administered 2023-10-13: 100 mL via INTRAVENOUS

## 2023-10-13 MED ORDER — ACETAMINOPHEN 650 MG RE SUPP: 650.0000 mg | Freq: Four times a day (QID) | RECTAL | Status: DC | PRN

## 2023-10-13 MED ORDER — ATORVASTATIN CALCIUM 10 MG PO TABS
20.0000 mg | ORAL_TABLET | Freq: Every day | ORAL | Status: DC
  Administered 2023-10-14 – 2023-10-15 (×2): 20 mg via ORAL
  Filled 2023-10-13 (×2): qty 2

## 2023-10-13 MED ORDER — PANTOPRAZOLE SODIUM 40 MG IV SOLR
40.0000 mg | Freq: Once | INTRAVENOUS | Status: AC
  Administered 2023-10-13: 40 mg via INTRAVENOUS
  Filled 2023-10-13: qty 10

## 2023-10-13 MED ORDER — TENAPANOR HCL 50 MG PO TABS: 50.0000 mg | ORAL_TABLET | Freq: Two times a day (BID) | ORAL | Status: DC

## 2023-10-13 NOTE — Telephone Encounter (Addendum)
 Called patient regarding results. Patient had understanding of results. Patient advised if bleeding continues to go to ED.----- Message from Lamarr Satterfield sent at 10/13/2023  7:27 AM EST ----- Hgb has dropped significantly to 7.7. She has an appointment today with oncology/hematology. Please let her know to have them look at these labs. if she continues to bleed, she may need to go to ED.   KL

## 2023-10-13 NOTE — ED Provider Triage Note (Signed)
 Emergency Medicine Provider Triage Evaluation Note  Sarah [name] , a 73 y.o. female  was evaluated in triage.  Pt complains of blood in the stool.  States last couple days her stool has been tarry black.  Had a recent admission for GI bleed.  Was contacted by her cardiologist today for hemoglobin of 7.7.  States she has been more tired and generally weak.  Also endorsing left lower quadrant abdominal pain.  Review of Systems  Positive: See above Negative: See above  Physical Exam  BP 121/85 (BP Location: Left Arm)   Pulse 100   Temp 97.6 F (36.4 C) (Oral)   Resp 18   Ht 5' 2 (1.575 m)   Wt 61.9 kg   SpO2 100%   BMI 24.96 kg/m  Gen:   Awake, no distress   Resp:  Normal effort  MSK:   Moves extremities without difficulty  Other:    Medical Decision Making  Medically screening exam initiated at 2:52 PM.  Appropriate orders placed.  Sarah [name] was informed that the remainder of the evaluation will be completed by another provider, this initial triage assessment does not replace that evaluation, and the importance of remaining in the ED until their evaluation is complete.  Work up started   Sarah Norleen POUR, PA-C 10/13/23 1454
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
## 2023-10-13 NOTE — ED Triage Notes (Signed)
 Pt recently admitted for GI bleed. States she is still bleeding and sent for hgb 7.7 and admission. Endorses generalized abd pain.

## 2023-10-13 NOTE — ED Provider Notes (Signed)
 [place] EMERGENCY DEPARTMENT AT [facility] Provider Note   CSN: [id number] Arrival date & time: 10/13/23  1316     History  Chief Complaint  Patient presents with   Blood In Stools    Sarah [name] is a 73 y.o. female with a history of TIA, hypertension, and hypokalemia who presents the ED today for bloody stools.  Patient reports bloody stools for the past 2 weeks.  She was taken to the ED via EMS on 12/26 but ultimately had a TIA and was admitted for that.  She was followed by gastroenterology while inpatient and her hemoglobin was monitored and stabilize throughout her hospitalization so she was supposed to follow-up with gastroenterology outpatient in the next week.  Patient reports that she is still been having dark stools with bright red blood when she wipes.  She had a follow-up with her cardiologist yesterday who ordered labs on her.  Her hemoglobin was 7.7 and she was told today to come to the ED.  Patient endorses associated lower abdominal pain but denies any new or worsening weakness, lightheadedness, shortness of breath.  She is not anticoagulated.  She states that she talked with Dr. Dianna with gastroenterology other today who told her to come to the hospital and that she will probably need an endoscopy tomorrow.  Denies any associated fevers, nausea, vomiting, or diarrhea.  No additional complaints or concerns at this time.    Home Medications Prior to Admission medications   Medication Sig Start Date End Date Taking? Authorizing Provider  acetaminophen  (TYLENOL ) 650 MG CR tablet Take 1,300 mg by mouth every 8 (eight) hours as needed (pain.).    [provider]  ALPRAZolam  (XANAX ) 0.5 MG tablet Take 0.5 mg by mouth at bedtime. 03/15/20   [provider]  aspirin  EC 81 MG tablet Take 1 tablet (81 mg total) by mouth daily. Swallow whole. Patient not taking: Reported on 10/13/2023 10/03/23   Cheryle Page, MD  atorvastatin  (LIPITOR) 20 MG  tablet Take 1 tablet (20 mg total) by mouth daily. 10/03/23 10/02/24  Cheryle Page, MD  cyclobenzaprine  (FLEXERIL ) 10 MG tablet Take 10 mg by mouth 3 (three) times daily as needed for muscle spasms. 02/17/23   [provider]  esomeprazole (NEXIUM) 20 MG capsule Take 20 mg by mouth daily before breakfast.    [provider]  furosemide  (LASIX ) 40 MG tablet Take 40 mg by mouth daily as needed. 08/05/23   [provider]  IBSRELA  50 MG TABS Take 1 tablet by mouth 2 (two) times daily. 09/03/23   [provider]  phentermine  (ADIPEX-P ) 37.5 MG tablet Take 18.75 mg by mouth every morning. 02/13/23   [provider]  potassium chloride  SA (KLOR-CON  M) 20 MEQ tablet Take 20 mEq by mouth 2 (two) times daily. 10/11/23   [provider]  SYNTHROID  75 MCG tablet Take 75 mcg by mouth daily. 10/11/23   [provider]  traMADol  (ULTRAM ) 50 MG tablet Take 50 mg by mouth every 4 (four) hours as needed (pain.). 03/01/23   [provider]      Allergies    Penicillins    Review of Systems   Review of Systems  Genitourinary:        Blood in stools  All other systems reviewed and are negative.   Physical Exam Updated Vital Signs BP (!) 144/77   Pulse 97   Temp 97.8 F (36.6 C) (Oral)   Resp 18   Ht  5' 2 (1.575 m)   Wt 61.9 kg   SpO2 97%   BMI 24.96 kg/m  Physical Exam Vitals and nursing note reviewed.  Constitutional:      General: She is not in acute distress.    Appearance: Normal appearance.  HENT:     Head: Normocephalic and atraumatic.     Mouth/Throat:     Mouth: Mucous membranes are moist.  Eyes:     Conjunctiva/sclera: Conjunctivae normal.     Pupils: Pupils are equal, round, and reactive to light.  Cardiovascular:     Rate and Rhythm: Normal rate and regular rhythm.     Pulses: Normal pulses.     Heart sounds:     Friction rub present.  Pulmonary:     Effort: Pulmonary effort is normal.     Breath sounds:  Normal breath sounds.  Abdominal:     Palpations: Abdomen is soft.     Tenderness: There is abdominal tenderness. There is no right CVA tenderness, left CVA tenderness, guarding or rebound.     Comments: Suprapubic and left lower quadrant tenderness to palpation  Skin:    General: Skin is warm and dry.     Findings: No rash.  Neurological:     General: No focal deficit present.     Mental Status: She is alert.     Sensory: No sensory deficit.     Motor: No weakness.  Psychiatric:        Mood and Affect: Mood normal.        Behavior: Behavior normal.     ED Results / Procedures / Treatments   Labs (all labs ordered are listed, but only abnormal results are displayed) Labs Reviewed  COMPREHENSIVE METABOLIC PANEL - Abnormal; Notable for the following components:      Result Value   Potassium 2.6 (*)    Glucose, Bld 130 (*)    Calcium  8.8 (*)    All other components within normal limits  CBC - Abnormal; Notable for the following components:   RBC 2.40 (*)    Hemoglobin 7.6 (*)    HCT 22.8 (*)    All other components within normal limits  MAGNESIUM  OCCULT BLOOD X 1 CARD TO LAB, STOOL  TYPE AND SCREEN  PREPARE RBC (CROSSMATCH)    EKG None  Radiology CT ABDOMEN PELVIS W CONTRAST Result Date: 10/13/2023 CLINICAL DATA:  Abdominal pain.  Decreased hemoglobin.  GI bleeding. EXAM: CT ABDOMEN AND PELVIS WITH CONTRAST TECHNIQUE: Multidetector CT imaging of the abdomen and pelvis was performed using the standard protocol following bolus administration of intravenous contrast. RADIATION DOSE REDUCTION: This exam was performed according to the departmental dose-optimization program which includes automated exposure control, adjustment of the mA and/or kV according to patient size and/or use of iterative reconstruction technique. CONTRAST:  OMNIPAQUE  IOHEXOL  300 MG/ML  SOLN COMPARISON:  02/02/22 FINDINGS: Lower Chest: No acute findings. Hepatobiliary: No suspicious hepatic masses  identified. Stable tiny cyst in inferior right hepatic lobe. Mild-to-moderate hepatic steatosis, without significant change. Prior cholecystectomy. No evidence of biliary obstruction. Pancreas:  No mass or inflammatory changes. Spleen: Within normal limits in size and appearance. Adrenals/Urinary Tract: No suspicious masses identified. No evidence of ureteral calculi or hydronephrosis. Stomach/Bowel: Stable small hiatal hernia with adjacent surgical clips which may be due to prior fundoplication. No evidence of obstruction, inflammatory process or abnormal fluid collections. Normal appendix visualized. Extensive colonic diverticulosis is again seen, without signs of diverticulitis. Vascular/Lymphatic: No pathologically enlarged lymph nodes.  No acute vascular findings. No evidence of retroperitoneal hemorrhage. Reproductive: Prior hysterectomy noted. Adnexal regions are unremarkable in appearance. Other:  None. Musculoskeletal: No suspicious bone lesions identified. Right hip prosthesis again noted. IMPRESSION: No acute findings. Colonic diverticulosis, without radiographic evidence of diverticulitis. Stable hepatic steatosis. Stable small hiatal hernia, with adjacent surgical clips which may be due to prior fundoplication. Electronically Signed   By: Norleen DELENA Kil M.D.   On: 10/13/2023 17:18    Procedures .Critical Care  Performed by: Waddell Sluder, PA-C Authorized by: Waddell Sluder, PA-C   Critical care provider statement:    Critical care time (minutes):  35   Critical care was necessary to treat or prevent imminent or life-threatening deterioration of the following conditions:  Circulatory failure   Critical care was time spent personally by me on the following activities:  Ordering and performing treatments and interventions, ordering and review of laboratory studies, ordering and review of radiographic studies, pulse oximetry, re-evaluation of patient's condition, review of old charts, examination of  patient, evaluation of patient's response to treatment, discussions with consultants, development of treatment plan with patient or surrogate, blood draw for specimens and obtaining history from patient or surrogate   Care discussed with: admitting provider      Medications Ordered in ED Medications  HYDROcodone -acetaminophen  (NORCO/VICODIN) 5-325 MG per tablet 1 tablet (has no administration in time range)  potassium chloride  10 mEq in 100 mL IVPB (10 mEq Intravenous New Bag/Given 10/13/23 2152)  acetaminophen  (TYLENOL ) tablet 650 mg (has no administration in time range)    Or  acetaminophen  (TYLENOL ) suppository 650 mg (has no administration in time range)  senna-docusate (Senokot-S) tablet 1 tablet (has no administration in time range)  ondansetron  (ZOFRAN ) tablet 4 mg (has no administration in time range)    Or  ondansetron  (ZOFRAN ) injection 4 mg (has no administration in time range)  iohexol  (OMNIPAQUE ) 300 MG/ML solution 100 mL (100 mLs Intravenous Contrast Given 10/13/23 1622)  sodium chloride  0.9 % bolus 1,000 mL (0 mLs Intravenous Stopped 10/13/23 2045)  morphine  (PF) 4 MG/ML injection 4 mg (4 mg Intravenous Given 10/13/23 1859)  pantoprazole  (PROTONIX ) injection 40 mg (40 mg Intravenous Given 10/13/23 2151)    ED Course/ Medical Decision Making/ A&P                                 Medical Decision Making Amount and/or Complexity of Data Reviewed Labs: ordered.  Risk Prescription drug management. Decision regarding hospitalization.   This patient presents to the ED for concern of blood in stool, this involves an extensive number of treatment options, and is a complaint that carries with it a high risk of complications and morbidity.   Differential diagnosis includes: upper GI Bleeding, bleeding diverticula, diverticulitis, IBD, colitis, internal vs external hemorrhoids, anal fissure, etc.    Comorbidities  See HPI above   Additional History  Additional history obtained  from recent hospitalization records.   Cardiac Monitoring / EKG  The patient was maintained on a cardiac monitor.  I personally viewed and interpreted the cardiac monitored which showed: sinus tachycardia with a heart rate of 104 bpm.   Lab Tests  I ordered and personally interpreted labs.  The pertinent results include:   Hemoglobin of 7.6 Potassium of 2.6   Imaging Studies  I ordered imaging studies including CT abdomen/pelvis  I independently visualized and interpreted imaging which showed:  No acute findings.  Colonic diverticulosis.  Stable hepatic steatosis.  Small stable hiatal hernia with adjacent surgical clips which may be due to prior fundoplication. I agree with the radiologist interpretation   Consultations  I spoke with Dr. Dianna with gastroenterology via secure chat who recommends: Making patient n.p.o. at midnight and that his partner will do an upper endoscopy tomorrow. I requested consultation with Dr. Lou with [facility],  and discussed lab and imaging findings as well as pertinent plan - they recommend: admission   Problem List / ED Course / Critical Interventions / Medication Management  Bloody stools x2 weeks with associated lower abdominal pain I ordered medications including: IV potassium with NS for hypokalemia Morphine  for abdominal pain 2L RBCs Protonix  for GI bleed  Reevaluation of the patient after these medicines showed that the patient improved I have reviewed the patients home medicines and have made adjustments as needed   Social Determinants of Health  Physical activity   Test / Admission - Considered  Discussed findings with patient. She is agreeable with plan for admission.        Final Clinical Impression(s) / ED Diagnoses Final diagnoses:  Blood in stool  Anemia, unspecified type  Hypokalemia    Rx / DC Orders ED Discharge Orders     None         Waddell Sluder, PA-C 10/13/23 2223     Doretha Folks, MD 10/19/23 [phone]

## 2023-10-14 ENCOUNTER — Inpatient Hospital Stay: Payer: Medicare HMO

## 2023-10-14 ENCOUNTER — Inpatient Hospital Stay (HOSPITAL_COMMUNITY): Payer: Medicare HMO | Admitting: Anesthesiology

## 2023-10-14 ENCOUNTER — Inpatient Hospital Stay (HOSPITAL_BASED_OUTPATIENT_CLINIC_OR_DEPARTMENT_OTHER): Payer: Medicare HMO | Admitting: Anesthesiology

## 2023-10-14 ENCOUNTER — Encounter (HOSPITAL_COMMUNITY)
Admission: EM | Disposition: A | Discharge status: Home / Self Care | Payer: Self-pay | Source: Home / Self Care | Attending: Emergency Medicine

## 2023-10-14 ENCOUNTER — Encounter (HOSPITAL_COMMUNITY): Payer: Self-pay | Admitting: Student

## 2023-10-14 DIAGNOSIS — K2970 Gastritis, unspecified, without bleeding: Secondary | ICD-10-CM

## 2023-10-14 DIAGNOSIS — E039 Hypothyroidism, unspecified: Secondary | ICD-10-CM | POA: Diagnosis not present

## 2023-10-14 DIAGNOSIS — I10 Essential (primary) hypertension: Secondary | ICD-10-CM | POA: Diagnosis not present

## 2023-10-14 DIAGNOSIS — K922 Gastrointestinal hemorrhage, unspecified: Secondary | ICD-10-CM | POA: Diagnosis not present

## 2023-10-14 DIAGNOSIS — R1084 Generalized abdominal pain: Secondary | ICD-10-CM | POA: Diagnosis not present

## 2023-10-14 DIAGNOSIS — K921 Melena: Secondary | ICD-10-CM | POA: Diagnosis not present

## 2023-10-14 DIAGNOSIS — K625 Hemorrhage of anus and rectum: Secondary | ICD-10-CM | POA: Diagnosis not present

## 2023-10-14 DIAGNOSIS — D62 Acute posthemorrhagic anemia: Secondary | ICD-10-CM | POA: Diagnosis not present

## 2023-10-14 DIAGNOSIS — E785 Hyperlipidemia, unspecified: Secondary | ICD-10-CM | POA: Diagnosis not present

## 2023-10-14 DIAGNOSIS — D649 Anemia, unspecified: Secondary | ICD-10-CM

## 2023-10-14 HISTORY — PX: ESOPHAGOGASTRODUODENOSCOPY (EGD) WITH PROPOFOL: SHX5813

## 2023-10-14 LAB — BASIC METABOLIC PANEL
Anion gap: 7 (ref 5–15)
BUN: 8 mg/dL (ref 8–23)
CO2: 23 mmol/L (ref 22–32)
Calcium: 8.2 mg/dL — ABNORMAL LOW (ref 8.9–10.3)
Chloride: 107 mmol/L (ref 98–111)
Creatinine, Ser: 0.66 mg/dL (ref 0.44–1.00)
GFR, Estimated: >60 mL/min (ref >60)
Glucose, Bld: 125 mg/dL — ABNORMAL HIGH (ref 70–99)
Potassium: 3.5 mmol/L (ref 3.5–5.1)
Sodium: 137 mmol/L (ref 135–145)

## 2023-10-14 LAB — FERRITIN: Ferritin: 34 ng/mL (ref 11–307)

## 2023-10-14 LAB — CBC
HCT: 26.9 % — ABNORMAL LOW (ref 36.0–46.0)
Hemoglobin: 8.9 g/dL — ABNORMAL LOW (ref 12.0–15.0)
MCH: 30.7 pg (ref 26.0–34.0)
MCHC: 33.1 g/dL (ref 30.0–36.0)
MCV: 92.8 fL (ref 80.0–100.0)
Platelets: 247 10*3/uL (ref 150–400)
RBC: 2.9 MIL/uL — ABNORMAL LOW (ref 3.87–5.11)
RDW: 14.6 % (ref 11.5–15.5)
WBC: 7.2 10*3/uL (ref 4.0–10.5)
nRBC: 0 % (ref 0.0–0.2)

## 2023-10-14 LAB — OCCULT BLOOD X 1 CARD TO LAB, STOOL: Fecal Occult Bld: POSITIVE — AB

## 2023-10-14 LAB — IRON AND TIBC
Iron: 36 ug/dL (ref 28–170)
Saturation Ratios: 10 % — ABNORMAL LOW (ref 10.4–31.8)
TIBC: 350 ug/dL (ref 250–450)
UIBC: 314 ug/dL

## 2023-10-14 LAB — VITAMIN B12: Vitamin B-12: 413 pg/mL (ref 180–914)

## 2023-10-14 SURGERY — ESOPHAGOGASTRODUODENOSCOPY (EGD) WITH PROPOFOL
Anesthesia: Monitor Anesthesia Care

## 2023-10-14 MED ORDER — PROPOFOL 10 MG/ML IV BOLUS
INTRAVENOUS | Status: DC | PRN
  Administered 2023-10-14: 20 mg via INTRAVENOUS
  Administered 2023-10-14: 10 mg via INTRAVENOUS

## 2023-10-14 MED ORDER — PROPOFOL 500 MG/50ML IV EMUL
INTRAVENOUS | Status: DC | PRN
  Administered 2023-10-14: 125 ug/kg/min via INTRAVENOUS

## 2023-10-14 MED ORDER — LIDOCAINE 2% (20 MG/ML) 5 ML SYRINGE
INTRAMUSCULAR | Status: DC | PRN
  Administered 2023-10-14: 60 mg via INTRAVENOUS

## 2023-10-14 MED ORDER — LACTATED RINGERS IV SOLN: INTRAVENOUS | Status: DC | PRN

## 2023-10-14 MED ORDER — POTASSIUM CHLORIDE CRYS ER 20 MEQ PO TBCR
40.0000 meq | EXTENDED_RELEASE_TABLET | Freq: Once | ORAL | Status: AC
  Administered 2023-10-14: 40 meq via ORAL
  Filled 2023-10-14: qty 2

## 2023-10-14 MED ORDER — PANTOPRAZOLE SODIUM 40 MG IV SOLR
40.0000 mg | Freq: Two times a day (BID) | INTRAVENOUS | Status: DC
  Administered 2023-10-14 – 2023-10-15 (×3): 40 mg via INTRAVENOUS
  Filled 2023-10-14 (×3): qty 10

## 2023-10-14 MED ORDER — ONDANSETRON HCL 4 MG/2ML IJ SOLN
INTRAMUSCULAR | Status: DC | PRN
  Administered 2023-10-14: 4 mg via INTRAVENOUS

## 2023-10-14 MED ORDER — PROPOFOL 500 MG/50ML IV EMUL
INTRAVENOUS | Status: AC
  Filled 2023-10-14: qty 50

## 2023-10-14 MED ORDER — MUSCLE RUB 10-15 % EX CREA
TOPICAL_CREAM | CUTANEOUS | Status: DC | PRN
  Administered 2023-10-14: 1 via TOPICAL
  Filled 2023-10-14: qty 85

## 2023-10-14 MED ORDER — CYCLOBENZAPRINE HCL 5 MG PO TABS
5.0000 mg | ORAL_TABLET | Freq: Three times a day (TID) | ORAL | Status: DC | PRN
  Administered 2023-10-15 (×2): 5 mg via ORAL
  Filled 2023-10-14 (×2): qty 1

## 2023-10-14 SURGICAL SUPPLY — 14 items

## 2023-10-14 NOTE — Op Note (Signed)
 [facility] Patient Name: Sarah [name] Procedure Date: 10/14/2023 MRN: [id number] Attending MD: Elsie Cree , MD, [id number] Date of Birth: 09/07/1951 CSN: [id number] Age: 73 Admit Type: Outpatient Procedure:                Upper GI endoscopy Indications:              Acute post hemorrhagic anemia, Hematochezia, Melena Providers:                Elsie Cree, MD, Particia Fischer, RN, Saralyn Greener, Technician Referring MD:             [facility] Medicines:                Monitored Anesthesia Care Complications:            No immediate complications. Estimated Blood Loss:     Estimated blood loss: none. Procedure:                Pre-Anesthesia Assessment:                           - Prior to the procedure, a History and Physical                            was performed, and patient medications and                            allergies were reviewed. The patient's tolerance of                            previous anesthesia was also reviewed. The risks                            and benefits of the procedure and the sedation                            options and risks were discussed with the patient.                            All questions were answered, and informed consent                            was obtained. Prior Anticoagulants: The patient has                            taken no anticoagulant or antiplatelet agents. ASA                            Grade Assessment: III - A patient with severe                            systemic disease. After reviewing the risks and  benefits, the patient was deemed in satisfactory                            condition to undergo the procedure.                           After obtaining informed consent, the endoscope was                            passed under direct vision. Throughout the                            procedure, the patient's blood pressure, pulse, and                             oxygen  saturations were monitored continuously. The                            [id number]-H190 ([id number]) Olympus endoscope was introduced                            through the mouth, and advanced to the second part                            of duodenum. The upper GI endoscopy was                            accomplished without difficulty. The patient                            tolerated the procedure well. Scope In: Scope Out: Findings:      The examined esophagus was normal.      Patchy mild inflammation was found in the prepyloric region of the       stomach.      The exam of the stomach was otherwise normal.      The duodenal bulb, first portion of the duodenum and second portion of       the duodenum were normal.      No old or fresh blood was seen to the extent of our examination. Impression:               - Normal esophagus.                           - Gastritis.                           - Normal duodenal bulb, first portion of the                            duodenum and second portion of the duodenum.                           - No source GI bleeding on endoscopy. Suspect  bleeding is likely from colon, in turn                            considerations include diverticulosis or                            hemorrhoids. Moderate Sedation:      None Recommendation:           - Return patient to hospital ward for ongoing care.                           - Clear liquid diet today.                           - Continue present medications.                           [facility] will follow. Procedure Code(s):        --- Professional ---                           [phone], Esophagogastroduodenoscopy, flexible,                            transoral; diagnostic, including collection of                            specimen(s) by brushing or washing, when performed                            (separate procedure) Diagnosis Code(s):        ---  Professional ---                           K29.70, Gastritis, unspecified, without bleeding                           D62, Acute posthemorrhagic anemia                           K92.1, Melena (includes Hematochezia) CPT copyright 2022 [employer] Association. All rights reserved. The codes documented in this report are preliminary and upon coder review may  be revised to meet current compliance requirements. Elsie Cree, MD 10/14/2023 1:15:53 PM This report has been signed electronically. Number of Addenda: 0

## 2023-10-14 NOTE — TOC Initial Note (Addendum)
 Transition of Care [facility]) - Initial/Assessment Note    Patient Details  Name: Sarah [name] MRN: [id number] Date of Birth: June 30, 1951  [facility]) CM/SW Contact:    Sonda Manuella Quill, RN Phone Number: 10/14/2023, 4:38 PM  Clinical Narrative:                 TOC for d/c planning; spoke w/ pt in room; pt says she lives at home; she plans to return at d/c; pt verified she has insurance/PCP; she identified POC Warren Gerald (son) [phone]; pt says she has transportation home; pt denies SDOH risks; pt says she has a cane, and walker; pt says she would like to con't HHPT w/ [facility]; pt says she does not have home oxygen ; also explained MOON to pt; she verbalized understanding and signed doucment; copy of MOON given to pt; no TOC needs identified; TOC signing off; please place consult if needed.  -1800- Amy Hyatt at [facility] given notification of pt's location; she says agency can con't services; agency contact info placed in follow up provider section of d/c instructions.  Expected Discharge Plan: Home/Self Care Barriers to Discharge: Continued Medical Work up   Patient Goals and CMS Choice Patient states their goals for this hospitalization and ongoing recovery are:: home CMS Medicare.gov Compare Post Acute Care list provided to:: Patient        Expected Discharge Plan and Services   Discharge Planning Services: CM Consult Post Acute Care Choice: Resumption of Svcs/PTA Provider (HHPT w/ [facility])                                        Prior Living Arrangements/Services   Lives with:: Self Patient language and need for interpreter reviewed:: Yes Do you feel safe going back to the place where you live?: Yes      Need for Family Participation in Patient Care: Yes (Comment) Care giver support system in place?: Yes (comment) Current home services: DME (cane, walker) Criminal Activity/Legal Involvement Pertinent to Current Situation/Hospitalization: No -  Comment as needed  Activities of Daily Living      Permission Sought/Granted Permission sought to share information with : Case Manager    Share Information with NAME: Case Manager     Permission granted to share info w Relationship: Warren Gerald (son) [phone]     Emotional Assessment Appearance:: Appears stated age Attitude/Demeanor/Rapport: Gracious Affect (typically observed): Accepting Orientation: : Oriented to Self, Oriented to Place, Oriented to  Time, Oriented to Situation Alcohol / Substance Use: Not Applicable Psych Involvement: No (comment)  Admission diagnosis:  Blood in stool [K92.1] Hypokalemia [E87.6] GI bleed [K92.2] Anemia, unspecified type [D64.9] Patient Active Problem List   Diagnosis Date Noted   Anemia 10/14/2023   Hypokalemia 10/01/2023   Left-sided weakness 10/01/2023   GI bleed 10/01/2023   Iron  deficiency anemia 05/19/2023   Osteoarthritis 02/04/2022   BRBPR (bright red blood per rectum) 02/02/2022   Hypothyroidism 02/02/2022   Anxiety 02/02/2022   Primary localized osteoarthritis of right hip 04/09/2020   S/P Nissen fundoplication (without gastrostomy tube) procedure 03/27/2019   Urge incontinence 10/21/2017   Dyslipidemia 01/31/2016   Psychosomatic factor in physical condition 01/31/2016   Aphasia    Blurred vision    Essential hypertension    Expressive aphasia 01/30/2016   Dysarthria 01/30/2016   HTN (hypertension) 01/30/2016   Dizziness    Headache  Slurred speech    Rectocele,recurrent 10/16/2015   PCP:  Marvine Rush, MD Pharmacy:   [facility] [phone] - [place], [place] - [address] [place] [address] Phone: [phone] Fax: [phone]     Social Drivers of Health (SDOH) Social History: SDOH Screenings   Food Insecurity: No Food Insecurity (10/14/2023)  Recent Concern: Food Insecurity - Food Insecurity Present (10/01/2023)  Housing: Low Risk  (10/14/2023)  Transportation  Needs: No Transportation Needs (10/14/2023)  Utilities: Not At Risk (10/14/2023)  Alcohol Screen: Low Risk  (11/05/2022)  Depression (PHQ2-9): Low Risk  (05/20/2023)  Financial Resource Strain: Low Risk  (11/05/2022)  Physical Activity: Insufficiently Active (11/05/2022)  Social Connections: Moderately Integrated (11/05/2022)  Stress: No Stress Concern Present (11/05/2022)  Tobacco Use: Low Risk  (10/14/2023)   SDOH Interventions: Food Insecurity Interventions: Intervention Not Indicated, Inpatient TOC Housing Interventions: Intervention Not Indicated, Inpatient TOC Transportation Interventions: Intervention Not Indicated, Inpatient TOC Utilities Interventions: Intervention Not Indicated, Inpatient TOC   Readmission Risk Interventions     No data to display

## 2023-10-14 NOTE — Plan of Care (Signed)

## 2023-10-14 NOTE — Care Management Obs Status (Signed)
 MEDICARE OBSERVATION STATUS NOTIFICATION   Patient Details  Name: Sarah [name] MRN: [id number] Date of Birth: 1951-02-11   Medicare Observation Status Notification Given:  Yes    Adrian Prows, RN 10/14/2023, 4:22 PM

## 2023-10-14 NOTE — Anesthesia Postprocedure Evaluation (Signed)
 Anesthesia Post Note  Patient: Sarah [name]  Procedure(s) Performed: ESOPHAGOGASTRODUODENOSCOPY (EGD) WITH PROPOFOL      Patient location during evaluation: PACU Anesthesia Type: MAC Level of consciousness: awake and alert Pain management: pain level controlled Vital Signs Assessment: post-procedure vital signs reviewed and stable Respiratory status: spontaneous breathing, nonlabored ventilation and respiratory function stable Cardiovascular status: stable and blood pressure returned to baseline Anesthetic complications: no   No notable events documented.  Last Vitals:  Vitals:   10/14/23 1310 10/14/23 1320  BP: (!) 149/74 133/75  Pulse: 97 93  Resp: 19 (!) 21  Temp:    SpO2: 95% 98%    Last Pain:  Vitals:   10/14/23 1320  TempSrc:   PainSc: 2                  Debby FORBES Like

## 2023-10-14 NOTE — Hospital Course (Signed)
 Sarah [name] is a 73 y.o. female with medical history significant of Nissen fundoplication, iron  deficiency anemia, fibromyalgia, chronic pain syndrome, rectal bleed, HTN, GERD, IBS, hypothyroidism, HLD and recent hospitalization for TIA who presented to the hospital with melena for 2 weeks and was noted to have hemoglobin of 7.7 on the outpatient lab.   Patient with history of admission for TIA between 12/26 -12/29.  She also had anemia at that time and was followed by  GI doc during the hospitalization however her hemoglobin stabilized and no endoscopic evaluation was done. Her discharge hemoglobin was 8.9. Patient states she continued to have black stools daily after discharge. She followed up with her cardiologist yesterday for blood work and was advised to stop her aspirin  and since hemoglobin was hemoglobin down to 7.7 was called and packed with hospital.  Patient also complained of fatigue, headache and generalized weakness.   In the ED, patient was slightly tachycardic.  Labs showed hypokalemia with potassium of 2.6.  EKG showed sinus tachycardia. CT A/P shows colonic diverticulosis, stable hepatic steatosis and small hiatal hernia and no acute intra-abdominal pathology.  In the ED patient received IV morphine  Protonix  IV fluid bolus potassium and monitor packed RBC and was considered for admission to the hospital for further evaluation and treatment.  Assessment and Plan:   GI bleed Patient with a past history of diverticulosis, internal hemorrhoids and multiple colonic polyps presented with 2 weeks of melena found to have drop in hemoglobin to 7.7 on outpatient labs. Repeat Hgb on admission 7.6.  No use of NSAID received 1 unit packed RBC and continue Protonix .  GI was consulted and plan for EGD.  Iron  panel with iron  of 36.  FOBT was positive.  Ferritin low at 34.  Vitamin B12 430 thin   Hypokalemia History of hypokalemia.  Replenished and improved.  Latest potassium 3.5.  Hold Lasix  for now.   Replenish as necessary.    Iron  deficiency anemia.  Intolerance to oral iron  supplement. Received IV iron  infusion at the cancer center in [place] last month. Iron  panel with ferritin of 34 at this time.   Hx of TIA Hyperlipidemia -Continue Lipitor.  Aspirin  on hold.   HTN Blood pressure is stable at this time.   Anxiety/Insomnia -Continue Xanax  at bedtime   Hypothyroidism -Continue on Synthroid    \IBS -Continue Tenapanor 

## 2023-10-14 NOTE — Transfer of Care (Signed)
 Immediate Anesthesia Transfer of Care Note  Patient: Sarah [name]  Procedure(s) Performed: ESOPHAGOGASTRODUODENOSCOPY (EGD) WITH PROPOFOL   Patient Location: PACU and Endoscopy Unit  Anesthesia Type:MAC  Level of Consciousness: drowsy  Airway & Oxygen  Therapy: Patient Spontanous Breathing and Patient connected to face mask oxygen   Post-op Assessment: Report given to RN and Post -op Vital signs reviewed and stable  Post vital signs: Reviewed and stable  Last Vitals:  Vitals Value Taken Time  BP    Temp    Pulse    Resp    SpO2      Last Pain:  Vitals:   10/14/23 1225  TempSrc: Tympanic  PainSc: 0-No pain         Complications: No notable events documented.

## 2023-10-14 NOTE — Anesthesia Preprocedure Evaluation (Addendum)
 Anesthesia Evaluation  Patient identified by MRN, date of birth, ID band Patient awake    Reviewed: Allergy & Precautions, NPO status , Patient's Chart, lab work & pertinent test results  History of Anesthesia Complications Negative for: history of anesthetic complications  Airway Mallampati: II  TM Distance: >3 FB Neck ROM: Full    Dental  (+) Dental Advisory Given   Pulmonary neg pulmonary ROS   Pulmonary exam normal        Cardiovascular hypertension,  Rhythm:Regular Rate:Tachycardia   '24 TTE - EF 55 to 60%. There is mild left ventricular hypertrophy. Grade I diastolic dysfunction (impaired relaxation). No significant valvular d/o identified.     Neuro/Psych  Headaches PSYCHIATRIC DISORDERS Anxiety     CVA, No Residual Symptoms    GI/Hepatic Neg liver ROS,GERD  Medicated and Controlled,,  Endo/Other  Hypothyroidism   Pre-DM   Renal/GU negative Renal ROS     Musculoskeletal  (+) Arthritis ,  Fibromyalgia -  Abdominal   Peds  Hematology  (+) Blood dyscrasia, anemia   Anesthesia Other Findings   Reproductive/Obstetrics                             Anesthesia Physical Anesthesia Plan  ASA: 3  Anesthesia Plan: MAC   Post-op Pain Management: Minimal or no pain anticipated   Induction:   PONV Risk Score and Plan: 2 and Propofol  infusion and Treatment may vary due to age or medical condition  Airway Management Planned: Nasal Cannula and Natural Airway  Additional Equipment: None  Intra-op Plan:   Post-operative Plan:   Informed Consent: I have reviewed the patients History and Physical, chart, labs and discussed the procedure including the risks, benefits and alternatives for the proposed anesthesia with the patient or authorized representative who has indicated his/her understanding and acceptance.       Plan Discussed with: CRNA and Anesthesiologist  Anesthesia Plan  Comments:        Anesthesia Quick Evaluation
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
## 2023-10-14 NOTE — Interval H&P Note (Signed)
 History and Physical Interval Note:  10/14/2023 12:34 PM  Sarah [name]  has presented today for surgery, with the diagnosis of melena, anemia.  The various methods of treatment have been discussed with the patient and family. After consideration of risks, benefits and other options for treatment, the patient has consented to  Procedure(s): ESOPHAGOGASTRODUODENOSCOPY (EGD) WITH PROPOFOL  (N/A) as a surgical intervention.  The patient's history has been reviewed, patient examined, no change in status, stable for surgery.  I have reviewed the patient's chart and labs.  Questions were answered to the patient's satisfaction.     [name]

## 2023-10-14 NOTE — Progress Notes (Addendum)
 PROGRESS NOTE  Sarah [name] DOB: [birth year] DOA: 10/13/2023 PCP: Marvine Rush, MD   LOS: 1 day   Brief narrative:  Sarah [name] is a 73 y.o. female with medical history significant of Nissen fundoplication, iron  deficiency anemia, fibromyalgia, chronic pain syndrome, rectal bleed, HTN, GERD, IBS, hypothyroidism, HLD and recent hospitalization for TIA who presented to the hospital with melena for 2 weeks and was noted to have hemoglobin of 7.7 on the outpatient lab.   Patient with history of admission for TIA between 12/26 -12/29.  She also had anemia at that time and was followed by  GI doc during the hospitalization however her hemoglobin stabilized and no endoscopic evaluation was done. Her discharge hemoglobin was 8.9. Patient states she continued to have black stools daily after discharge. She followed up with her cardiologist yesterday for blood work and was advised to stop her aspirin  and since hemoglobin was hemoglobin down to 7.7 was called and packed with hospital.  Patient also complained of fatigue, headache and generalized weakness.   In the ED, patient was slightly tachycardic.  Labs showed hypokalemia with potassium of 2.6.  EKG showed sinus tachycardia. CT A/P shows colonic diverticulosis, stable hepatic steatosis and small hiatal hernia and no acute intra-abdominal pathology.  In the ED patient received IV morphine  Protonix  IV fluid bolus potassium and monitor packed RBC and was considered for admission to the hospital for further evaluation and treatment.  Assessment and Plan:   GI bleed Patient with a past history of diverticulosis, internal hemorrhoids and multiple colonic polyps presented with 2 weeks of melena found to have drop in hemoglobin to 7.7 on outpatient labs. Repeat Hgb on admission 7.6.  No use of NSAID received 1 unit packed RBC and continue Protonix .    Iron  panel with iron  of 36.  FOBT was positive.  Ferritin low at 34.  Vitamin B12 430.  Plan  for further intervention as per GI.  Currently n.p.o. for potential EGD.   Hypokalemia History of hypokalemia.  Replenished and improved.  Latest potassium 3.5.  Hold Lasix  for now.  Replenish as necessary.     Iron  deficiency anemia.  Intolerance to oral iron  supplement. Received IV iron  infusion at the cancer center in  last month. Iron  panel with ferritin of 34 at this time.   Hx of TIA Hyperlipidemia -Continue Lipitor.  Aspirin  on hold.   HTN Blood pressure is stable at this time.  Will continue to monitor.   Anxiety/Insomnia -Continue Xanax  at bedtime   Hypothyroidism -Continue on Synthroid    IBS -Continue Tenapanor    DVT prophylaxis: SCDs Start: 10/13/23 2214   Disposition: Home likely in 1 to 2 days  Status is: Inpatient Remains inpatient appropriate because: GI bleed melena GI workup,    Code Status:     Code Status: Full Code  Family Communication: Spoke with the patient's son on the phone and updated him about the clinical condition of the patient.  Consultants: GI  Procedures: PRBC transfusion  Anti-infectives:  None  Anti-infectives (From admission, onward)    None        Subjective: Today, patient was seen and examined at bedside.  Patient stated that she feels thirsty and hungry.  Denies any abdominal pain, nausea or vomiting fever chills or shortness of breath.  Objective: Vitals:   10/14/23 0922 10/14/23 1018  BP: 128/74   Pulse: 93   Resp: 17   Temp:  98.7 F (37.1 C)  SpO2: 100%  Intake/Output Summary (Last 24 hours) at 10/14/2023 1041 Last data filed at 10/14/2023 0120 Gross per 24 hour  Intake 1900 ml  Output --  Net 1900 ml   Filed Weights   10/13/23 1343  Weight: 61.9 kg   Body mass index is 24.96 kg/m.   Physical Exam:  GENERAL: Patient is alert awake and oriented. Not in obvious distress.  Communicative of HENT: Mild pallor noted pupils equally reactive to light. Oral mucosa is moist NECK: is  supple, no gross swelling noted. CHEST: Clear to auscultation. No crackles or wheezes.  Diminished breath sounds bilaterally. CVS: S1 and S2 heard, no murmur. Regular rate and rhythm.  ABDOMEN: Soft, non-tender, bowel sounds are present. EXTREMITIES: No edema. CNS: Cranial nerves are intact. No focal motor deficits. SKIN: warm and dry without rashes.  Data Review: I have personally reviewed the following laboratory data and studies,  CBC: Recent Labs  Lab 10/12/23 0000 10/13/23 1449 10/14/23 0341  WBC 8.8 7.5 7.2  HGB 7.7* 7.6* 8.9*  HCT 23.3* 22.8* 26.9*  MCV 95 95.0 92.8  PLT 346 298 247   Basic Metabolic Panel: Recent Labs  Lab 10/13/23 1449 10/13/23 1855 10/14/23 0341  NA 139  --  137  K 2.6*  --  3.5  CL 109  --  107  CO2 22  --  23  GLUCOSE 130*  --  125*  BUN 17  --  8  CREATININE 0.77  --  0.66  CALCIUM  8.8*  --  8.2*  MG  --  2.1  --    Liver Function Tests: Recent Labs  Lab 10/13/23 1449  AST 22  ALT 16  ALKPHOS 82  BILITOT 0.3  PROT 7.0  ALBUMIN 3.9   No results for input(s): LIPASE, AMYLASE in the last 168 hours. No results for input(s): AMMONIA in the last 168 hours. Cardiac Enzymes: No results for input(s): CKTOTAL, CKMB, CKMBINDEX, TROPONINI in the last 168 hours. BNP (last 3 results) No results for input(s): BNP in the last 8760 hours.  ProBNP (last 3 results) No results for input(s): PROBNP in the last 8760 hours.  CBG: No results for input(s): GLUCAP in the last 168 hours. No results found for this or any previous visit (from the past 240 hours).   Studies: CT ABDOMEN PELVIS W CONTRAST Result Date: 10/13/2023 CLINICAL DATA:  Abdominal pain.  Decreased hemoglobin.  GI bleeding. EXAM: CT ABDOMEN AND PELVIS WITH CONTRAST TECHNIQUE: Multidetector CT imaging of the abdomen and pelvis was performed using the standard protocol following bolus administration of intravenous contrast. RADIATION DOSE REDUCTION: This exam was  performed according to the departmental dose-optimization program which includes automated exposure control, adjustment of the mA and/or kV according to patient size and/or use of iterative reconstruction technique. CONTRAST:  OMNIPAQUE  IOHEXOL  300 MG/ML  SOLN COMPARISON:  02/02/22 FINDINGS: Lower Chest: No acute findings. Hepatobiliary: No suspicious hepatic masses identified. Stable tiny cyst in inferior right hepatic lobe. Mild-to-moderate hepatic steatosis, without significant change. Prior cholecystectomy. No evidence of biliary obstruction. Pancreas:  No mass or inflammatory changes. Spleen: Within normal limits in size and appearance. Adrenals/Urinary Tract: No suspicious masses identified. No evidence of ureteral calculi or hydronephrosis. Stomach/Bowel: Stable small hiatal hernia with adjacent surgical clips which may be due to prior fundoplication. No evidence of obstruction, inflammatory process or abnormal fluid collections. Normal appendix visualized. Extensive colonic diverticulosis is again seen, without signs of diverticulitis. Vascular/Lymphatic: No pathologically enlarged lymph nodes. No acute vascular findings. No evidence  of retroperitoneal hemorrhage. Reproductive: Prior hysterectomy noted. Adnexal regions are unremarkable in appearance. Other:  None. Musculoskeletal: No suspicious bone lesions identified. Right hip prosthesis again noted. IMPRESSION: No acute findings. Colonic diverticulosis, without radiographic evidence of diverticulitis. Stable hepatic steatosis. Stable small hiatal hernia, with adjacent surgical clips which may be due to prior fundoplication. Electronically Signed   By: Norleen DELENA Kil M.D.   On: 10/13/2023 17:18      Vernal Alstrom, MD  [facility] 10/14/2023  If 7PM-7AM, please contact night-coverage

## 2023-10-14 NOTE — Anesthesia Procedure Notes (Signed)
 Procedure Name: MAC Date/Time: 10/14/2023 12:42 PM  Performed by: Orest Dikes, CRNAPre-anesthesia Checklist: Patient identified, Emergency Drugs available, Suction available and Patient being monitored Oxygen Delivery Method: Simple face mask
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
## 2023-10-15 DIAGNOSIS — K922 Gastrointestinal hemorrhage, unspecified: Secondary | ICD-10-CM | POA: Diagnosis not present

## 2023-10-15 DIAGNOSIS — D649 Anemia, unspecified: Secondary | ICD-10-CM | POA: Diagnosis not present

## 2023-10-15 LAB — CBC
HCT: 26.5 % — ABNORMAL LOW (ref 36.0–46.0)
Hemoglobin: 8.7 g/dL — ABNORMAL LOW (ref 12.0–15.0)
MCH: 30.6 pg (ref 26.0–34.0)
MCHC: 32.8 g/dL (ref 30.0–36.0)
MCV: 93.3 fL (ref 80.0–100.0)
Platelets: 255 10*3/uL (ref 150–400)
RBC: 2.84 MIL/uL — ABNORMAL LOW (ref 3.87–5.11)
RDW: 14.5 % (ref 11.5–15.5)
WBC: 5.7 10*3/uL (ref 4.0–10.5)
nRBC: 0 % (ref 0.0–0.2)

## 2023-10-15 LAB — MAGNESIUM: Magnesium: 2.3 mg/dL (ref 1.7–2.4)

## 2023-10-15 LAB — BASIC METABOLIC PANEL
Anion gap: 10 (ref 5–15)
BUN: 6 mg/dL — ABNORMAL LOW (ref 8–23)
CO2: 22 mmol/L (ref 22–32)
Calcium: 8.7 mg/dL — ABNORMAL LOW (ref 8.9–10.3)
Chloride: 108 mmol/L (ref 98–111)
Creatinine, Ser: 0.62 mg/dL (ref 0.44–1.00)
GFR, Estimated: >60 mL/min (ref >60)
Glucose, Bld: 121 mg/dL — ABNORMAL HIGH (ref 70–99)
Potassium: 3.8 mmol/L (ref 3.5–5.1)
Sodium: 140 mmol/L (ref 135–145)

## 2023-10-15 MED ORDER — TRAMADOL HCL 50 MG PO TABS
50.0000 mg | ORAL_TABLET | Freq: Once | ORAL | Status: AC
  Administered 2023-10-15: 50 mg via ORAL
  Filled 2023-10-15: qty 1

## 2023-10-15 NOTE — Discharge Summary (Signed)
 Physician Discharge Summary  Sarah [name] FMW:[id number] DOB: 1951/06/06 DOA: 10/13/2023  PCP: Marvine Rush, MD  Admit date: 10/13/2023 Discharge date: 10/15/2023  Admitted From: Home  Discharge disposition: Home    Recommendations for Outpatient Follow-Up:   Follow up with your primary care provider in one week.  Check CBC, BMP, magnesium in the next visit Follow-up with Dr. Dianna GI as outpatient in 2 to 3 weeks.   Discharge Diagnosis:   Principal Problem:   GI bleed Active Problems:   Anemia   Discharge Condition: Improved.  Diet recommendation:  Regular.  Wound care: None.  Code status: Full.   History of Present Illness:   Sarah [name] is a 73 y.o. female with medical history significant of Nissen fundoplication, iron  deficiency anemia, fibromyalgia, chronic pain syndrome, rectal bleed, HTN, GERD, IBS, hypothyroidism, HLD and recent hospitalization for TIA who presented to the hospital with melena for 2 weeks and was noted to have hemoglobin of 7.7 on the outpatient lab.   Patient with history of admission for TIA between 12/26 -12/29.  She also had anemia at that time and was followed by  GI doc during the hospitalization however her hemoglobin stabilized and no endoscopic evaluation was done. Her discharge hemoglobin was 8.9. Patient states she continued to have black stools daily after discharge. She followed up with her cardiologist yesterday for blood work and was advised to stop her aspirin  and since hemoglobin was hemoglobin down to 7.7 was called and packed with hospital. In the ED, patient was slightly tachycardic.  Labs showed hypokalemia with potassium of 2.6.  EKG showed sinus tachycardia. CT A/P shows colonic diverticulosis, stable hepatic steatosis and small hiatal hernia and no acute intra-abdominal pathology.  In the ED patient received IV morphine  Protonix  IV fluid bolus potassium and monitor packed RBC and was considered for admission to the  hospital for further evaluation and treatment   Hospital Course:   Following conditions were addressed during hospitalization as listed below,  GI bleed Patient with a past history of diverticulosis, internal hemorrhoids and multiple colonic polyps presented with 2 weeks of melena found to have drop in hemoglobin to 7.7 on outpatient labs.  No further episodes of bowel movement since 1/ 9/25.  FOBT was positive.  Ferritin low at 34.  Vitamin B12 430.Latest hemoglobin today at 8.7 from 8.9 yesterday and has remained stable.  Patient has undergone EGD with no significant findings.  Patient did have colonoscopy within the last 6 months with Dr. Kari and Dr. Delila does not recommend further colonoscopic evaluation at this time.  He recommends admission advancing diet and if okay discharge home.  Patient is adamant about going home today.  Informed her of the risk of rebleed and the need for coming to the hospital in that situation.  Patient states that she does not take NSAIDs and was encouraged to do that.       Hypokalemia Improved after repletion.  Latest potassium of 3.8.   Iron  deficiency anemia.  Intolerance to oral iron  supplement. Received IV iron  infusion at the cancer center in [place] last month. Iron  panel with ferritin of 34 at this time.  Would recommend continuation of IV iron  infusion as outpatient.   Hx of TIA Hyperlipidemia -Continue Lipitor.  States that she does not take aspirin  show will discontinue.   HTN Blood pressure is stable at this time.    Anxiety/Insomnia -Continue Xanax  at bedtime   Hypothyroidism -Continue on Synthroid    IBS -Continue  Tenapanor    Disposition.  At this time, patient is stable for disposition home with outpatient PCP and GI follow-up.  Medical Consultants:   GI  Procedures:    Upper GI endoscopy on 10/14/2023 PRBC transfusion 1 unit  Subjective:   Today, patient was seen and examined at bedside.  States that she has not  had a bowel movement since yesterday.  Wishes to go home strongly today.  Denies any nausea vomiting or abdominal pain.  Discharge Exam:   Vitals:   10/15/23 0425 10/15/23 1203  BP: 107/66 (!) 142/86  Pulse: 85 100  Resp: 17   Temp: 98.1 F (36.7 C) (!) 97.5 F (36.4 C)  SpO2: 95% 100%   Vitals:   10/14/23 1941 10/14/23 2344 10/15/23 0425 10/15/23 1203  BP: 134/70 114/64 107/66 (!) 142/86  Pulse: 96 91 85 100  Resp: 18 18 17    Temp: 98 F (36.7 C) 98.3 F (36.8 C) 98.1 F (36.7 C) (!) 97.5 F (36.4 C)  TempSrc:    Oral  SpO2: 100% 96% 95% 100%  Weight:      Height:       Body mass index is 24.96 kg/m.   General: Alert awake, not in obvious distress HENT: pupils equally reacting to light, mild pallor noted.  Oral mucosa is moist.  Chest:  Clear breath sounds. No crackles or wheezes.  CVS: S1 &S2 heard. No murmur.  Regular rate and rhythm. Abdomen: Soft, nontender, nondistended.  Bowel sounds are heard.   Extremities: No cyanosis, clubbing or edema.  Peripheral pulses are palpable. Psych: Alert, awake and oriented, normal mood CNS:  No cranial nerve deficits.  Power equal in all extremities.   Skin: Warm and dry.  No rashes noted.  The results of significant diagnostics from this hospitalization (including imaging, microbiology, ancillary and laboratory) are listed below for reference.     Diagnostic Studies:   CT ABDOMEN PELVIS W CONTRAST Result Date: 10/13/2023 CLINICAL DATA:  Abdominal pain.  Decreased hemoglobin.  GI bleeding. EXAM: CT ABDOMEN AND PELVIS WITH CONTRAST TECHNIQUE: Multidetector CT imaging of the abdomen and pelvis was performed using the standard protocol following bolus administration of intravenous contrast. RADIATION DOSE REDUCTION: This exam was performed according to the departmental dose-optimization program which includes automated exposure control, adjustment of the mA and/or kV according to patient size and/or use of iterative reconstruction  technique. CONTRAST:  OMNIPAQUE  IOHEXOL  300 MG/ML  SOLN COMPARISON:  02/02/22 FINDINGS: Lower Chest: No acute findings. Hepatobiliary: No suspicious hepatic masses identified. Stable tiny cyst in inferior right hepatic lobe. Mild-to-moderate hepatic steatosis, without significant change. Prior cholecystectomy. No evidence of biliary obstruction. Pancreas:  No mass or inflammatory changes. Spleen: Within normal limits in size and appearance. Adrenals/Urinary Tract: No suspicious masses identified. No evidence of ureteral calculi or hydronephrosis. Stomach/Bowel: Stable small hiatal hernia with adjacent surgical clips which may be due to prior fundoplication. No evidence of obstruction, inflammatory process or abnormal fluid collections. Normal appendix visualized. Extensive colonic diverticulosis is again seen, without signs of diverticulitis. Vascular/Lymphatic: No pathologically enlarged lymph nodes. No acute vascular findings. No evidence of retroperitoneal hemorrhage. Reproductive: Prior hysterectomy noted. Adnexal regions are unremarkable in appearance. Other:  None. Musculoskeletal: No suspicious bone lesions identified. Right hip prosthesis again noted. IMPRESSION: No acute findings. Colonic diverticulosis, without radiographic evidence of diverticulitis. Stable hepatic steatosis. Stable small hiatal hernia, with adjacent surgical clips which may be due to prior fundoplication. Electronically Signed   By: Norleen DELENA Graham CHRISTELLA.D.  On: 10/13/2023 17:18     Labs:   Basic Metabolic Panel: Recent Labs  Lab 10/13/23 1449 10/13/23 1855 10/14/23 0341 10/15/23 0422  NA 139  --  137 140  K 2.6*  --  3.5 3.8  CL 109  --  107 108  CO2 22  --  23 22  GLUCOSE 130*  --  125* 121*  BUN 17  --  8 6*  CREATININE 0.77  --  0.66 0.62  CALCIUM  8.8*  --  8.2* 8.7*  MG  --  2.1  --  2.3   GFR Estimated Creatinine Clearance: 56.6 mL/min (by C-G formula based on SCr of 0.62 mg/dL). Liver Function Tests: Recent  Labs  Lab 10/13/23 1449  AST 22  ALT 16  ALKPHOS 82  BILITOT 0.3  PROT 7.0  ALBUMIN 3.9   No results for input(s): LIPASE, AMYLASE in the last 168 hours. No results for input(s): AMMONIA in the last 168 hours. Coagulation profile No results for input(s): INR, PROTIME in the last 168 hours.  CBC: Recent Labs  Lab 10/12/23 0000 10/13/23 1449 10/14/23 0341 10/15/23 0422  WBC 8.8 7.5 7.2 5.7  HGB 7.7* 7.6* 8.9* 8.7*  HCT 23.3* 22.8* 26.9* 26.5*  MCV 95 95.0 92.8 93.3  PLT 346 298 247 255   Cardiac Enzymes: No results for input(s): CKTOTAL, CKMB, CKMBINDEX, TROPONINI in the last 168 hours. BNP: Invalid input(s): POCBNP CBG: No results for input(s): GLUCAP in the last 168 hours. D-Dimer No results for input(s): DDIMER in the last 72 hours. Hgb A1c No results for input(s): HGBA1C in the last 72 hours. Lipid Profile No results for input(s): CHOL, HDL, LDLCALC, TRIG, CHOLHDL, LDLDIRECT in the last 72 hours. Thyroid  function studies No results for input(s): TSH, T4TOTAL, T3FREE, THYROIDAB in the last 72 hours.  Invalid input(s): FREET3 Anemia work up Recent Labs    10/14/23 0341  VITAMINB12 413  FERRITIN 34  TIBC 350  IRON  36   Microbiology No results found for this or any previous visit (from the past 240 hours).   Discharge Instructions:   Discharge Instructions     Diet - low sodium heart healthy   Complete by: As directed    Discharge instructions   Complete by: As directed    Follow up with your primary care provider in one week. Check blood work at that time. Follow up Dr Dianna, GI as outpatient in 2-3 weeks. Seek medical attention for worsening symptoms and bleeding.   Increase activity slowly   Complete by: As directed    No wound care   Complete by: As directed       Allergies as of 10/15/2023       Reactions   Penicillins Itching, Other (See Comments)   Tolerated Cephalosporins  04/24/2020. Has patient had a PCN reaction causing immediate rash, facial/tongue/throat swelling, SOB or lightheadedness with hypotension: Yes Has patient had a PCN reaction causing severe rash involving mucus membranes or skin necrosis: No Has patient had a PCN reaction that required hospitalization No Has patient had a PCN reaction occurring within the last 10 years: No Patient states allergy noted in the 1970s.        Medication List     STOP taking these medications    aspirin  EC 81 MG tablet       TAKE these medications    acetaminophen  650 MG CR tablet Commonly known as: TYLENOL  Take 1,300 mg by mouth every 8 (eight) hours as needed (  pain.).   ALPRAZolam  0.5 MG tablet Commonly known as: XANAX  Take 0.5 mg by mouth at bedtime.   atorvastatin  20 MG tablet Commonly known as: Lipitor Take 1 tablet (20 mg total) by mouth daily.   cyclobenzaprine  10 MG tablet Commonly known as: FLEXERIL  Take 10 mg by mouth 3 (three) times daily as needed for muscle spasms.   esomeprazole 20 MG capsule Commonly known as: NEXIUM Take 20 mg by mouth daily before breakfast.   furosemide  40 MG tablet Commonly known as: LASIX  Take 40 mg by mouth daily as needed.   Ibsrela  50 MG Tabs Generic drug: Tenapanor  HCl Take 1 tablet by mouth 2 (two) times daily.   phentermine  37.5 MG tablet Commonly known as: ADIPEX-P  Take 18.75 mg by mouth every morning.   potassium chloride  SA 20 MEQ tablet Commonly known as: KLOR-CON  M Take 20 mEq by mouth 2 (two) times daily.   Synthroid  75 MCG tablet Generic drug: levothyroxine  Take 75 mcg by mouth daily.   traMADol  50 MG tablet Commonly known as: ULTRAM  Take 50 mg by mouth every 4 (four) hours as needed (pain.).        Follow-up Information     [facility] Follow up.   Contact information: [address] [place] [address] [phone]         Marvine Rush, MD Follow up in 1 week(s).   Specialty:  Family Medicine Contact information: [address] [place] [address] [phone]         Dianna Specking, MD Follow up in 2 week(s).   Specialty: Gastroenterology Contact information: [address] [place] [address] [phone]                  Time coordinating discharge: 39 minutes  Signed:  Evangelina Delancey  [facility] 10/15/2023, 2:14 PM

## 2023-10-17 ENCOUNTER — Encounter (HOSPITAL_COMMUNITY): Payer: Self-pay | Admitting: Gastroenterology

## 2023-10-17 LAB — TYPE AND SCREEN
ABO/RH(D): A POS
Antibody Screen: NEGATIVE
Unit division: 0
Unit division: 0

## 2023-10-17 LAB — BPAM RBC
Blood Product Expiration Date: 202502032359
Blood Product Expiration Date: 202502032359
ISSUE DATE / TIME: 202501082103
Unit Type and Rh: 6200
Unit Type and Rh: 6200

## 2023-10-20 ENCOUNTER — Other Ambulatory Visit: Payer: Self-pay

## 2023-10-20 ENCOUNTER — Encounter: Payer: Self-pay | Admitting: Hematology

## 2023-10-20 DIAGNOSIS — D509 Iron deficiency anemia, unspecified: Secondary | ICD-10-CM

## 2023-10-21 ENCOUNTER — Inpatient Hospital Stay: Payer: Medicare HMO | Attending: Hematology

## 2023-10-21 ENCOUNTER — Other Ambulatory Visit: Payer: Self-pay

## 2023-10-21 ENCOUNTER — Inpatient Hospital Stay: Payer: Medicare HMO | Admitting: Oncology

## 2023-10-21 DIAGNOSIS — F5089 Other specified eating disorder: Secondary | ICD-10-CM | POA: Diagnosis not present

## 2023-10-21 DIAGNOSIS — K760 Fatty (change of) liver, not elsewhere classified: Secondary | ICD-10-CM | POA: Diagnosis not present

## 2023-10-21 DIAGNOSIS — Z90710 Acquired absence of both cervix and uterus: Secondary | ICD-10-CM | POA: Insufficient documentation

## 2023-10-21 DIAGNOSIS — D509 Iron deficiency anemia, unspecified: Secondary | ICD-10-CM

## 2023-10-21 DIAGNOSIS — R0602 Shortness of breath: Secondary | ICD-10-CM | POA: Insufficient documentation

## 2023-10-21 DIAGNOSIS — Z9049 Acquired absence of other specified parts of digestive tract: Secondary | ICD-10-CM | POA: Insufficient documentation

## 2023-10-21 DIAGNOSIS — R531 Weakness: Secondary | ICD-10-CM | POA: Diagnosis not present

## 2023-10-21 DIAGNOSIS — K5730 Diverticulosis of large intestine without perforation or abscess without bleeding: Secondary | ICD-10-CM | POA: Insufficient documentation

## 2023-10-21 DIAGNOSIS — K449 Diaphragmatic hernia without obstruction or gangrene: Secondary | ICD-10-CM | POA: Diagnosis not present

## 2023-10-21 DIAGNOSIS — Z880 Allergy status to penicillin: Secondary | ICD-10-CM | POA: Diagnosis not present

## 2023-10-21 DIAGNOSIS — R5383 Other fatigue: Secondary | ICD-10-CM | POA: Insufficient documentation

## 2023-10-21 DIAGNOSIS — I119 Hypertensive heart disease without heart failure: Secondary | ICD-10-CM | POA: Insufficient documentation

## 2023-10-21 DIAGNOSIS — Z860100 Personal history of colon polyps, unspecified: Secondary | ICD-10-CM | POA: Insufficient documentation

## 2023-10-21 DIAGNOSIS — Z8249 Family history of ischemic heart disease and other diseases of the circulatory system: Secondary | ICD-10-CM | POA: Insufficient documentation

## 2023-10-21 DIAGNOSIS — R200 Anesthesia of skin: Secondary | ICD-10-CM | POA: Insufficient documentation

## 2023-10-21 DIAGNOSIS — Z8673 Personal history of transient ischemic attack (TIA), and cerebral infarction without residual deficits: Secondary | ICD-10-CM | POA: Diagnosis not present

## 2023-10-21 DIAGNOSIS — Z79899 Other long term (current) drug therapy: Secondary | ICD-10-CM | POA: Diagnosis not present

## 2023-10-21 DIAGNOSIS — E785 Hyperlipidemia, unspecified: Secondary | ICD-10-CM | POA: Insufficient documentation

## 2023-10-21 DIAGNOSIS — K30 Functional dyspepsia: Secondary | ICD-10-CM | POA: Insufficient documentation

## 2023-10-21 DIAGNOSIS — Z881 Allergy status to other antibiotic agents status: Secondary | ICD-10-CM | POA: Diagnosis not present

## 2023-10-21 DIAGNOSIS — Z809 Family history of malignant neoplasm, unspecified: Secondary | ICD-10-CM | POA: Insufficient documentation

## 2023-10-21 DIAGNOSIS — M7989 Other specified soft tissue disorders: Secondary | ICD-10-CM | POA: Diagnosis not present

## 2023-10-21 DIAGNOSIS — M47812 Spondylosis without myelopathy or radiculopathy, cervical region: Secondary | ICD-10-CM | POA: Insufficient documentation

## 2023-10-21 DIAGNOSIS — K7689 Other specified diseases of liver: Secondary | ICD-10-CM | POA: Diagnosis not present

## 2023-10-21 DIAGNOSIS — Z803 Family history of malignant neoplasm of breast: Secondary | ICD-10-CM | POA: Insufficient documentation

## 2023-10-21 DIAGNOSIS — Z833 Family history of diabetes mellitus: Secondary | ICD-10-CM | POA: Insufficient documentation

## 2023-10-21 DIAGNOSIS — I371 Nonrheumatic pulmonary valve insufficiency: Secondary | ICD-10-CM | POA: Insufficient documentation

## 2023-10-21 LAB — FERRITIN: Ferritin: 20 ng/mL (ref 11–307)

## 2023-10-21 LAB — IRON AND TIBC
Iron: 30 ug/dL (ref 28–170)
Saturation Ratios: 7 % — ABNORMAL LOW (ref 10.4–31.8)
TIBC: 452 ug/dL — ABNORMAL HIGH (ref 250–450)
UIBC: 422 ug/dL

## 2023-10-21 LAB — VITAMIN B12: Vitamin B-12: 559 pg/mL (ref 180–914)

## 2023-10-21 LAB — CBC
HCT: 32.7 % — ABNORMAL LOW (ref 36.0–46.0)
Hemoglobin: 10.3 g/dL — ABNORMAL LOW (ref 12.0–15.0)
MCH: 29.2 pg (ref 26.0–34.0)
MCHC: 31.5 g/dL (ref 30.0–36.0)
MCV: 92.6 fL (ref 80.0–100.0)
Platelets: 324 10*3/uL (ref 150–400)
RBC: 3.53 MIL/uL — ABNORMAL LOW (ref 3.87–5.11)
RDW: 13.4 % (ref 11.5–15.5)
WBC: 7.1 10*3/uL (ref 4.0–10.5)
nRBC: 0 % (ref 0.0–0.2)

## 2023-10-21 LAB — FOLATE: Folate: 10.7 ng/mL (ref >5.9)

## 2023-10-25 LAB — METHYLMALONIC ACID, SERUM: Methylmalonic Acid, Quantitative: 224 nmol/L (ref 0–378)

## 2023-10-26 DIAGNOSIS — E876 Hypokalemia: Secondary | ICD-10-CM | POA: Diagnosis not present

## 2023-10-26 DIAGNOSIS — I10 Essential (primary) hypertension: Secondary | ICD-10-CM | POA: Diagnosis not present

## 2023-10-26 DIAGNOSIS — K625 Hemorrhage of anus and rectum: Secondary | ICD-10-CM | POA: Diagnosis not present

## 2023-10-26 DIAGNOSIS — R531 Weakness: Secondary | ICD-10-CM | POA: Diagnosis not present

## 2023-10-27 DIAGNOSIS — M25551 Pain in right hip: Secondary | ICD-10-CM | POA: Diagnosis not present

## 2023-10-28 ENCOUNTER — Inpatient Hospital Stay (HOSPITAL_BASED_OUTPATIENT_CLINIC_OR_DEPARTMENT_OTHER): Payer: Medicare HMO | Admitting: Oncology

## 2023-10-28 DIAGNOSIS — M7989 Other specified soft tissue disorders: Secondary | ICD-10-CM | POA: Diagnosis not present

## 2023-10-28 DIAGNOSIS — E785 Hyperlipidemia, unspecified: Secondary | ICD-10-CM | POA: Diagnosis not present

## 2023-10-28 DIAGNOSIS — Z79899 Other long term (current) drug therapy: Secondary | ICD-10-CM | POA: Diagnosis not present

## 2023-10-28 DIAGNOSIS — Z860100 Personal history of colon polyps, unspecified: Secondary | ICD-10-CM | POA: Diagnosis not present

## 2023-10-28 DIAGNOSIS — K449 Diaphragmatic hernia without obstruction or gangrene: Secondary | ICD-10-CM | POA: Diagnosis not present

## 2023-10-28 DIAGNOSIS — I371 Nonrheumatic pulmonary valve insufficiency: Secondary | ICD-10-CM | POA: Diagnosis not present

## 2023-10-28 DIAGNOSIS — K30 Functional dyspepsia: Secondary | ICD-10-CM | POA: Diagnosis not present

## 2023-10-28 DIAGNOSIS — Z881 Allergy status to other antibiotic agents status: Secondary | ICD-10-CM | POA: Diagnosis not present

## 2023-10-28 DIAGNOSIS — K5730 Diverticulosis of large intestine without perforation or abscess without bleeding: Secondary | ICD-10-CM | POA: Diagnosis not present

## 2023-10-28 DIAGNOSIS — Z8673 Personal history of transient ischemic attack (TIA), and cerebral infarction without residual deficits: Secondary | ICD-10-CM | POA: Diagnosis not present

## 2023-10-28 DIAGNOSIS — R0602 Shortness of breath: Secondary | ICD-10-CM | POA: Diagnosis not present

## 2023-10-28 DIAGNOSIS — K760 Fatty (change of) liver, not elsewhere classified: Secondary | ICD-10-CM | POA: Diagnosis not present

## 2023-10-28 DIAGNOSIS — F5089 Other specified eating disorder: Secondary | ICD-10-CM | POA: Diagnosis not present

## 2023-10-28 DIAGNOSIS — Z90710 Acquired absence of both cervix and uterus: Secondary | ICD-10-CM | POA: Diagnosis not present

## 2023-10-28 DIAGNOSIS — Z880 Allergy status to penicillin: Secondary | ICD-10-CM | POA: Diagnosis not present

## 2023-10-28 DIAGNOSIS — Z833 Family history of diabetes mellitus: Secondary | ICD-10-CM | POA: Diagnosis not present

## 2023-10-28 DIAGNOSIS — K7689 Other specified diseases of liver: Secondary | ICD-10-CM | POA: Diagnosis not present

## 2023-10-28 DIAGNOSIS — D509 Iron deficiency anemia, unspecified: Secondary | ICD-10-CM

## 2023-10-28 DIAGNOSIS — I119 Hypertensive heart disease without heart failure: Secondary | ICD-10-CM | POA: Diagnosis not present

## 2023-10-28 DIAGNOSIS — M47812 Spondylosis without myelopathy or radiculopathy, cervical region: Secondary | ICD-10-CM | POA: Diagnosis not present

## 2023-10-28 DIAGNOSIS — R200 Anesthesia of skin: Secondary | ICD-10-CM | POA: Diagnosis not present

## 2023-10-28 DIAGNOSIS — R531 Weakness: Secondary | ICD-10-CM | POA: Diagnosis not present

## 2023-10-28 DIAGNOSIS — R5383 Other fatigue: Secondary | ICD-10-CM | POA: Diagnosis not present

## 2023-10-28 DIAGNOSIS — Z9049 Acquired absence of other specified parts of digestive tract: Secondary | ICD-10-CM | POA: Diagnosis not present

## 2023-10-28 NOTE — Progress Notes (Signed)
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 occlusion, or hemodynamically significant stenosis (greater than 50%). Skeleton: No acute osseous abnormality. Degenerative changes in the cervical spine. Other neck: No acute finding. Upper chest: No focal pulmonary opacity or pleural effusion. Review of the MIP images confirms the above findings CTA HEAD FINDINGS Anterior circulation: Both internal carotid arteries are patent to the termini, without significant stenosis. A1 segments patent. Normal anterior communicating artery. Anterior cerebral arteries are patent to their distal aspects without significant stenosis. No M1 stenosis or occlusion. MCA branches perfused to their distal aspects without significant stenosis. Posterior circulation: Vertebral arteries patent to the vertebrobasilar junction without significant stenosis. Posterior inferior cerebellar arteries patent proximally. Basilar patent to its distal aspect without significant stenosis. Superior cerebellar arteries patent proximally. Patent P1 segments. PCAs perfused to their distal aspects without significant  stenosis. Venous sinuses: As permitted by contrast timing, patent. Anatomic variants: None significant. No evidence of aneurysm or vascular malformation. Review of the MIP images confirms the above findings IMPRESSION: 1. No intracranial large vessel occlusion or significant stenosis. 2. No hemodynamically significant stenosis in the neck. 3. No perfusion abnormality. Electronically Signed   By: Wiliam Ke M.D.   On: 09/30/2023 22:08   CT HEAD CODE STROKE WO CONTRAST Result Date: 09/30/2023 CLINICAL DATA:  Code stroke. EXAM: CT HEAD WITHOUT CONTRAST TECHNIQUE: Contiguous axial images were obtained from the base of the skull through the vertex without intravenous contrast. RADIATION DOSE REDUCTION: This exam was performed according to the departmental dose-optimization program which includes automated  exposure control, adjustment of the mA and/or kV according to patient size and/or use of iterative reconstruction technique. COMPARISON:  09/30/2019 FINDINGS: Brain: No evidence of acute infarction, hemorrhage, mass, mass effect, or midline shift. No hydrocephalus or extra-axial collection. Periventricular white matter changes, likely the sequela of chronic small vessel ischemic disease. Advanced cerebral atrophy for age. Vascular: No hyperdense vessel. Atherosclerotic calcifications in the intracranial carotid and vertebral arteries. Skull: Negative for fracture or focal lesion. Sinuses/Orbits: No acute finding. Other: The mastoid air cells are well aerated. ASPECTS [facility] Stroke Program Early CT Score) - Ganglionic level infarction (caudate, lentiform nuclei, internal capsule, insula, M1-M3 cortex): 7 - Supraganglionic infarction (M4-M6 cortex): 3 Total score (0-10 with 10 being normal): 10 IMPRESSION: No acute intracranial process. ASPECTS [name] 10. Code stroke imaging results were communicated on 09/30/2023 at 9:00 pm to provider Groce via telephone, who verbally acknowledged these results. Electronically  Signed   By: Wiliam Ke M.D.   On: 09/30/2023 21:00   EXERCISE TOLERANCE TEST (ETT) Result Date: 09/30/2023   Exercise stress test:  Clinically and electrically negative for ischemia.

## 2023-10-29 ENCOUNTER — Encounter: Payer: Self-pay | Admitting: Hematology

## 2023-11-04 ENCOUNTER — Inpatient Hospital Stay: Payer: Medicare HMO

## 2023-11-04 VITALS — BP 125/73 | HR 93 | Temp 96.6°F | Resp 18

## 2023-11-04 DIAGNOSIS — Z833 Family history of diabetes mellitus: Secondary | ICD-10-CM | POA: Diagnosis not present

## 2023-11-04 DIAGNOSIS — K30 Functional dyspepsia: Secondary | ICD-10-CM | POA: Diagnosis not present

## 2023-11-04 DIAGNOSIS — Z881 Allergy status to other antibiotic agents status: Secondary | ICD-10-CM | POA: Diagnosis not present

## 2023-11-04 DIAGNOSIS — R200 Anesthesia of skin: Secondary | ICD-10-CM | POA: Diagnosis not present

## 2023-11-04 DIAGNOSIS — D509 Iron deficiency anemia, unspecified: Secondary | ICD-10-CM

## 2023-11-04 DIAGNOSIS — Z860100 Personal history of colon polyps, unspecified: Secondary | ICD-10-CM | POA: Diagnosis not present

## 2023-11-04 DIAGNOSIS — K5730 Diverticulosis of large intestine without perforation or abscess without bleeding: Secondary | ICD-10-CM | POA: Diagnosis not present

## 2023-11-04 DIAGNOSIS — I119 Hypertensive heart disease without heart failure: Secondary | ICD-10-CM | POA: Diagnosis not present

## 2023-11-04 DIAGNOSIS — E785 Hyperlipidemia, unspecified: Secondary | ICD-10-CM | POA: Diagnosis not present

## 2023-11-04 DIAGNOSIS — Z8673 Personal history of transient ischemic attack (TIA), and cerebral infarction without residual deficits: Secondary | ICD-10-CM | POA: Diagnosis not present

## 2023-11-04 DIAGNOSIS — K449 Diaphragmatic hernia without obstruction or gangrene: Secondary | ICD-10-CM | POA: Diagnosis not present

## 2023-11-04 DIAGNOSIS — M47812 Spondylosis without myelopathy or radiculopathy, cervical region: Secondary | ICD-10-CM | POA: Diagnosis not present

## 2023-11-04 DIAGNOSIS — I371 Nonrheumatic pulmonary valve insufficiency: Secondary | ICD-10-CM | POA: Diagnosis not present

## 2023-11-04 DIAGNOSIS — Z9049 Acquired absence of other specified parts of digestive tract: Secondary | ICD-10-CM | POA: Diagnosis not present

## 2023-11-04 DIAGNOSIS — R531 Weakness: Secondary | ICD-10-CM | POA: Diagnosis not present

## 2023-11-04 DIAGNOSIS — Z90710 Acquired absence of both cervix and uterus: Secondary | ICD-10-CM | POA: Diagnosis not present

## 2023-11-04 DIAGNOSIS — F5089 Other specified eating disorder: Secondary | ICD-10-CM | POA: Diagnosis not present

## 2023-11-04 DIAGNOSIS — R5383 Other fatigue: Secondary | ICD-10-CM | POA: Diagnosis not present

## 2023-11-04 DIAGNOSIS — M7989 Other specified soft tissue disorders: Secondary | ICD-10-CM | POA: Diagnosis not present

## 2023-11-04 DIAGNOSIS — Z79899 Other long term (current) drug therapy: Secondary | ICD-10-CM | POA: Diagnosis not present

## 2023-11-04 DIAGNOSIS — Z880 Allergy status to penicillin: Secondary | ICD-10-CM | POA: Diagnosis not present

## 2023-11-04 DIAGNOSIS — R0602 Shortness of breath: Secondary | ICD-10-CM | POA: Diagnosis not present

## 2023-11-04 DIAGNOSIS — K760 Fatty (change of) liver, not elsewhere classified: Secondary | ICD-10-CM | POA: Diagnosis not present

## 2023-11-04 DIAGNOSIS — K7689 Other specified diseases of liver: Secondary | ICD-10-CM | POA: Diagnosis not present

## 2023-11-04 MED ORDER — FAMOTIDINE IN NACL 20-0.9 MG/50ML-% IV SOLN
20.0000 mg | Freq: Once | INTRAVENOUS | Status: AC
Start: 2023-11-04 — End: 2023-11-04
  Administered 2023-11-04: 20 mg via INTRAVENOUS
  Filled 2023-11-04: qty 50

## 2023-11-04 MED ORDER — METHYLPREDNISOLONE SODIUM SUCC 125 MG IJ SOLR
125.0000 mg | Freq: Once | INTRAMUSCULAR | Status: AC
  Administered 2023-11-04: 125 mg via INTRAVENOUS
  Filled 2023-11-04: qty 2

## 2023-11-04 MED ORDER — CETIRIZINE HCL 10 MG/ML IV SOLN
10.0000 mg | Freq: Once | INTRAVENOUS | Status: AC
Start: 2023-11-04 — End: 2023-11-04
  Administered 2023-11-04: 10 mg via INTRAVENOUS
  Filled 2023-11-04: qty 1

## 2023-11-04 MED ORDER — SODIUM CHLORIDE 0.9 % IV SOLN: INTRAVENOUS | Status: DC

## 2023-11-04 MED ORDER — IRON DEXTRAN 50 MG/ML IJ SOLN
1000.0000 mg | Freq: Once | INTRAMUSCULAR | Status: AC
  Administered 2023-11-04: 1000 mg via INTRAVENOUS
  Filled 2023-11-04: qty 20

## 2023-11-04 MED ORDER — ACETAMINOPHEN 325 MG PO TABS
650.0000 mg | ORAL_TABLET | Freq: Once | ORAL | Status: AC
  Administered 2023-11-04: 650 mg via ORAL
  Filled 2023-11-04: qty 2

## 2023-11-04 NOTE — Progress Notes (Signed)
Infed given today per MD orders. Tolerated infusion without adverse affects. Vital signs stable. No complaints at this time. Discharged from clinic by wheel chair and accompanied by Virginia Rochester CNA in stable condition. Alert and oriented x 3. F/U with [facility] as scheduled.

## 2023-11-04 NOTE — Patient Instructions (Signed)
[facility] [place] - A DEPT OF [facility]  Discharge Instructions: Thank you for choosing [facility] to provide your oncology and hematology care.  If you have a lab appointment with the Cancer Center - please note that after April 8th, 2024, all labs will be drawn in the cancer center.  You do not have to check in or register with the main entrance as you have in the past but will complete your check-in in the cancer center.  Wear comfortable clothing and clothing appropriate for easy access to any Portacath or PICC line.   We strive to give you quality time with your provider. You may need to reschedule your appointment if you arrive late (15 or more minutes).  Arriving late affects you and other patients whose appointments are after yours.  Also, if you miss three or more appointments without notifying the office, you may be dismissed from the clinic at the provider's discretion.      For prescription refill requests, have your pharmacy contact our office and allow 72 hours for refills to be completed.    Today you received the following chemotherapy and/or immunotherapy agents Infed. Iron Dextran Injection What is this medication? IRON DEXTRAN (EYE ern DEX tran) treats low levels of iron in your body. Iron is a mineral that plays an important role in making red blood cells, which carry oxygen from your lungs to the rest of your body. This medicine may be used for other purposes; ask your health care provider or pharmacist if you have questions. COMMON BRAND NAME(S): Dexferrum, INFeD What should I tell my care team before I take this medication? They need to know if you have any of these conditions: Anemia not caused by low iron levels Heart disease High levels of iron in the blood Kidney disease Liver disease An unusual or allergic reaction to iron, other medications, foods, dyes, or preservatives Pregnant or trying to get  pregnant Breastfeeding How should I use this medication? This medication is injected into a vein or a muscle. It is given by your care team in a hospital or clinic setting. Talk to your care team about the use of this medication in children. While it may be prescribed for children as young as 4 months for selected conditions, precautions do apply. Overdosage: If you think you have taken too much of this medicine contact a poison control center or emergency room at once. NOTE: This medicine is only for you. Do not share this medicine with others. What if I miss a dose? Keep appointments for follow-up doses. It is important not to miss your dose. Call your care team if you are unable to keep an appointment. What may interact with this medication? Do not take this medication with any of the following: Deferoxamine Dimercaprol Other iron products This medication may also interact with the following: Chloramphenicol Deferasirox This list may not describe all possible interactions. Give your health care provider a list of all the medicines, herbs, non-prescription drugs, or dietary supplements you use. Also tell them if you smoke, drink alcohol, or use illegal drugs. Some items may interact with your medicine. What should I watch for while using this medication? Visit your care team for regular checks on your progress. Tell your care team if your symptoms do not start to get better or if they get worse. You may need blood work while taking this medication. You may need to eat more foods that contain  iron. Talk to your care team. Foods that contain iron include whole grains/cereals, dried fruits, beans, peas, leafy green vegetables, and organ meats (liver, kidney). Long-term use of this medication may increase your risk of some cancers. Talk to your care team about your risk of cancer. What side effects may I notice from receiving this medication? Side effects that you should report to your care  team as soon as possible: Allergic reactions--skin rash, itching, hives, swelling of the face, lips, tongue, or throat Low blood pressure--dizziness, feeling faint or lightheaded, blurry vision Shortness of breath Side effects that usually do not require medical attention (report to your care team if they continue or are bothersome): Flushing Headache Joint pain Muscle pain Nausea Pain, redness, or irritation at injection site This list may not describe all possible side effects. Call your doctor for medical advice about side effects. You may report side effects to FDA at 1-800-FDA-1088. Where should I keep my medication? This medication is given in a hospital or clinic. It will not be stored at home. NOTE: This sheet is a summary. It may not cover all possible information. If you have questions about this medicine, talk to your doctor, pharmacist, or health care provider.  2024 Elsevier/Gold Standard (2023-05-12 00:00:00)      To help prevent nausea and vomiting after your treatment, we encourage you to take your nausea medication as directed.  BELOW ARE SYMPTOMS THAT SHOULD BE REPORTED IMMEDIATELY: *FEVER GREATER THAN 100.4 F (38 C) OR HIGHER *CHILLS OR SWEATING *NAUSEA AND VOMITING THAT IS NOT CONTROLLED WITH YOUR NAUSEA MEDICATION *UNUSUAL SHORTNESS OF BREATH *UNUSUAL BRUISING OR BLEEDING *URINARY PROBLEMS (pain or burning when urinating, or frequent urination) *BOWEL PROBLEMS (unusual diarrhea, constipation, pain near the anus) TENDERNESS IN MOUTH AND THROAT WITH OR WITHOUT PRESENCE OF ULCERS (sore throat, sores in mouth, or a toothache) UNUSUAL RASH, SWELLING OR PAIN  UNUSUAL VAGINAL DISCHARGE OR ITCHING   Items with * indicate a potential emergency and should be followed up as soon as possible or go to the Emergency Department if any problems should occur.  Please show the CHEMOTHERAPY ALERT CARD or IMMUNOTHERAPY ALERT CARD at check-in to the Emergency Department and triage  nurse.  Should you have questions after your visit or need to cancel or reschedule your appointment, please contact [facility] [place] - A DEPT OF [facility]igh[facility] Brideg[facility] [phone]  and follow the prompts.  Office hours are 8:00 a.m. to 4:30 p.m. Monday - Friday. Please note that voicemails left after 4:00 p.m. may not be returned until the following business day.  We are closed weekends and major holidays. You have access to a nurse at all times for urgent questions. Please call the main number to the clinic [phone] and follow the prompts.  For any non-urgent questions, you may also contact your provider using MyChart. We now offer e-Visits for anyone 19 and older to request care online for non-urgent symptoms. For details visit [email or web address].   Also download the MyChart app! Go to the app store, search "MyChart", open the app, select [place], and log in with your MyChart username and password.

## 2023-11-09 DIAGNOSIS — K5904 Chronic idiopathic constipation: Secondary | ICD-10-CM | POA: Diagnosis not present

## 2023-11-16 ENCOUNTER — Encounter: Payer: Self-pay | Admitting: Hematology

## 2023-11-19 ENCOUNTER — Telehealth: Payer: Self-pay | Admitting: Cardiology

## 2023-11-19 NOTE — Telephone Encounter (Signed)
Patient identification verified by 2 forms. Marilynn Rail, RN    Called and spoke to patient Patient states:   -has not seen heart rate <100 in a while   -last Wednesday heart rate increased to 158 while mopping, she became lightheaded   -elevated heart rate issue was also occurring while she was in the hospital   -heart rate increases with light activity   -during some episodes she feels like she will pass out   -she would like to know why this continues to happen   -she has fatigue all throughout the day  During call patient sounds very SOB  Patient scheduled for OV 2/17 at 1:55pm with NP Lyman Bishop  Reviewed ED warning signs/precautions  Patient verbalized understanding, no questions at this time

## 2023-11-19 NOTE — Telephone Encounter (Signed)
STAT if HR is under 50 or over 120 (normal HR is 60-100 beats per minute)  What is your heart rate? 115  Do you have a log of your heart rate readings (document readings)? 158  Do you have any other symptoms? Sarah [name]

## 2023-11-21 NOTE — Progress Notes (Unsigned)
Cardiology Office Note:  .   Date:  11/22/2023  ID:  Sarah [name], DOB [birth year], MRN [id number] PCP: Assunta Found, MD  [facility] Providers Cardiologist:  Rollene Rotunda, MD }   History of Present Illness: .   Sarah [name] is a 73 y.o. female with history of iron deficiency anemia history of Nissen fundoplication, dyslipidemia, hypertension, hypothyroidism, rectal bleeding.  I saw her last on 10/12/2023 with continued complaints of dyspnea, rectal bleeding, and fatigue.  CBC was ordered which revealed a hemoglobin of 7.7 and hematocrit of 23.3.  The patient was subsequently admitted for ongoing GI bleed.  She received blood transfusion.    She had an EGD with no significant findings, she had a colonoscopy within the last 6 months and they GI physicians did not recommend further colonoscopy during hospitalization.  She was also found to be hypokalemic and this was repleted she received iron infusion at the cancer center and was recommended to continue this through the cancer center in [place].  She today feeling some better.  She still has some dyspnea on exertion and is being followed closely by GI.  She uses a walker for ambulation and is able to walk to her mailbox but does get tired.  She is not dyspneic at rest.  She denies chest pain.  She does pant sometimes which causes tingling in her hands from hyperventilation.  She continues iron infusions through the cancer center in [place].  ROS: As above otherwise negative  Studies Reviewed: Marland Kitchen    EKG Interpretation Date/Time:  Monday November 22 2023 13:48:17 EST Ventricular Rate:  83 PR Interval:  160 QRS Duration:  86 QT Interval:  372 QTC Calculation: 437 R Axis:   -46  Text Interpretation: Normal sinus rhythm Left anterior fascicular block Anterolateral infarct , age undetermined When compared with ECG of 13-Oct-2023 19:09, PREVIOUS ECG IS PRESENT Confirmed by Joni Reining [phone]) on 11/22/2023 2:26:42 PM     Physical Exam:   VS:  BP 110/86 (BP Location: Left Arm, Patient Position: Sitting, Cuff Size: Normal)   Pulse 83   Ht 5\' 2"  (1.575 m)   Wt 136 lb (61.7 kg)   SpO2 99%   BMI 24.87 kg/m    Wt Readings from Last 3 Encounters:  11/22/23 136 lb (61.7 kg)  10/28/23 136 lb (61.7 kg)  10/14/23 136 lb 7.4 oz (61.9 kg)    GEN: Well nourished, well developed in no acute distress NECK: No JVD; No carotid bruits CARDIAC: RRR, no murmurs, rubs, gallops RESPIRATORY:  Clear to auscultation without rales, wheezing or rhonchi  ABDOMEN: Soft, non-tender, non-distended EXTREMITIES:  No edema; No deformity   ASSESSMENT AND PLAN: .    Chronic dyspnea on exertion: Multifactorial in the setting of iron deficiency anemia, GI bleed, and anemia.  She is being followed closely by GI.  Has had blood transfusions and is now on iron infusions.  Defer to GI and hematology for ongoing management.  2.  Deconditioning: Patient has significant deconditioning and is working on strengthening with physical therapy.  She uses a walker for ambulation.  She is determined to get her strength back.  I have asked her to take her time and to report any worsening breathing which may indicate that her hemoglobin is again dropping.  She is to follow-up with GI and hematology.  No plan cardiac ischemic testing at this time.  3.  Dyslipidemia: Labs are followed by PCP.  No planned changes in her regimen.  Signed, Bettey Mare. Liborio Nixon, ANP, AACC

## 2023-11-22 ENCOUNTER — Ambulatory Visit: Payer: Medicare HMO | Attending: Adult Health | Admitting: Adult Health

## 2023-11-22 ENCOUNTER — Encounter: Payer: Self-pay | Admitting: Adult Health

## 2023-11-22 VITALS — BP 110/86 | HR 83 | Ht 62.0 in | Wt 136.0 lb

## 2023-11-22 DIAGNOSIS — I10 Essential (primary) hypertension: Secondary | ICD-10-CM | POA: Diagnosis not present

## 2023-11-22 DIAGNOSIS — E7800 Pure hypercholesterolemia, unspecified: Secondary | ICD-10-CM

## 2023-11-22 DIAGNOSIS — D509 Iron deficiency anemia, unspecified: Secondary | ICD-10-CM | POA: Diagnosis not present

## 2023-11-22 NOTE — Patient Instructions (Signed)
 Medication Instructions:  No Changes *If you need a refill on your cardiac medications before your next appointment, please call your pharmacy*   Lab Work: No Labs If you have labs (blood work) drawn today and your tests are completely normal, you will receive your results only by: MyChart Message (if you have MyChart) OR A paper copy in the mail If you have any lab test that is abnormal or we need to change your treatment, we will call you to review the results.   Testing/Procedures: No Testing   Follow-Up: At [facility], you and your health needs are our priority.  As part of our continuing mission to provide you with exceptional heart care, we have created designated Provider Care Teams.  These Care Teams include your primary Cardiologist (physician) and Advanced Practice Providers (APPs -  Physician Assistants and Nurse Practitioners) who all work together to provide you with the care you need, when you need it.  We recommend signing up for the patient portal called "MyChart".  Sign up information is provided on this After Visit Summary.  MyChart is used to connect with patients for Virtual Visits (Telemedicine).  Patients are able to view lab/test results, encounter notes, upcoming appointments, etc.  Non-urgent messages can be sent to your provider as well.   To learn more about what you can do with MyChart, go to [email or web address].    Your next appointment:   6 month(s)  Provider:   Rollene Rotunda, MD

## 2023-12-01 DIAGNOSIS — I69354 Hemiplegia and hemiparesis following cerebral infarction affecting left non-dominant side: Secondary | ICD-10-CM | POA: Diagnosis not present

## 2023-12-01 DIAGNOSIS — M1990 Unspecified osteoarthritis, unspecified site: Secondary | ICD-10-CM | POA: Diagnosis not present

## 2023-12-01 DIAGNOSIS — Z008 Encounter for other general examination: Secondary | ICD-10-CM | POA: Diagnosis not present

## 2023-12-01 DIAGNOSIS — K5190 Ulcerative colitis, unspecified, without complications: Secondary | ICD-10-CM | POA: Diagnosis not present

## 2023-12-01 DIAGNOSIS — K219 Gastro-esophageal reflux disease without esophagitis: Secondary | ICD-10-CM | POA: Diagnosis not present

## 2023-12-01 DIAGNOSIS — F419 Anxiety disorder, unspecified: Secondary | ICD-10-CM | POA: Diagnosis not present

## 2023-12-01 DIAGNOSIS — E039 Hypothyroidism, unspecified: Secondary | ICD-10-CM | POA: Diagnosis not present

## 2023-12-01 DIAGNOSIS — E785 Hyperlipidemia, unspecified: Secondary | ICD-10-CM | POA: Diagnosis not present

## 2023-12-01 DIAGNOSIS — Z833 Family history of diabetes mellitus: Secondary | ICD-10-CM | POA: Diagnosis not present

## 2023-12-01 DIAGNOSIS — G629 Polyneuropathy, unspecified: Secondary | ICD-10-CM | POA: Diagnosis not present

## 2023-12-01 DIAGNOSIS — Z809 Family history of malignant neoplasm, unspecified: Secondary | ICD-10-CM | POA: Diagnosis not present

## 2023-12-01 DIAGNOSIS — Z8249 Family history of ischemic heart disease and other diseases of the circulatory system: Secondary | ICD-10-CM | POA: Diagnosis not present

## 2023-12-01 DIAGNOSIS — G319 Degenerative disease of nervous system, unspecified: Secondary | ICD-10-CM | POA: Diagnosis not present

## 2023-12-13 DIAGNOSIS — E039 Hypothyroidism, unspecified: Secondary | ICD-10-CM | POA: Diagnosis not present

## 2023-12-13 DIAGNOSIS — E876 Hypokalemia: Secondary | ICD-10-CM | POA: Diagnosis not present

## 2023-12-13 DIAGNOSIS — Z6825 Body mass index (BMI) 25.0-25.9, adult: Secondary | ICD-10-CM | POA: Diagnosis not present

## 2023-12-13 DIAGNOSIS — E663 Overweight: Secondary | ICD-10-CM | POA: Diagnosis not present

## 2023-12-13 DIAGNOSIS — R531 Weakness: Secondary | ICD-10-CM | POA: Diagnosis not present

## 2023-12-13 DIAGNOSIS — M797 Fibromyalgia: Secondary | ICD-10-CM | POA: Diagnosis not present

## 2023-12-13 DIAGNOSIS — E782 Mixed hyperlipidemia: Secondary | ICD-10-CM | POA: Diagnosis not present

## 2023-12-20 DIAGNOSIS — M7061 Trochanteric bursitis, right hip: Secondary | ICD-10-CM | POA: Diagnosis not present

## 2023-12-30 ENCOUNTER — Inpatient Hospital Stay: Payer: Medicare HMO | Attending: Hematology

## 2023-12-30 DIAGNOSIS — D509 Iron deficiency anemia, unspecified: Secondary | ICD-10-CM | POA: Diagnosis not present

## 2023-12-30 LAB — CBC
HCT: 42.5 % (ref 36.0–46.0)
Hemoglobin: 14.3 g/dL (ref 12.0–15.0)
MCH: 29 pg (ref 26.0–34.0)
MCHC: 33.6 g/dL (ref 30.0–36.0)
MCV: 86.2 fL (ref 80.0–100.0)
Platelets: 261 10*3/uL (ref 150–400)
RBC: 4.93 MIL/uL (ref 3.87–5.11)
RDW: 15.7 % — ABNORMAL HIGH (ref 11.5–15.5)
WBC: 11.9 10*3/uL — ABNORMAL HIGH (ref 4.0–10.5)
nRBC: 0 % (ref 0.0–0.2)

## 2023-12-30 LAB — IRON AND TIBC
Iron: 132 ug/dL (ref 28–170)
Saturation Ratios: 37 % — ABNORMAL HIGH (ref 10.4–31.8)
TIBC: 360 ug/dL (ref 250–450)
UIBC: 228 ug/dL

## 2023-12-30 LAB — FERRITIN: Ferritin: 111 ng/mL (ref 11–307)

## 2024-01-05 ENCOUNTER — Inpatient Hospital Stay: Attending: Hematology | Admitting: Oncology

## 2024-01-05 VITALS — BP 112/74 | HR 82 | Temp 98.1°F | Resp 18 | Wt 142.2 lb

## 2024-01-05 DIAGNOSIS — D509 Iron deficiency anemia, unspecified: Secondary | ICD-10-CM | POA: Diagnosis not present

## 2024-01-05 DIAGNOSIS — Z803 Family history of malignant neoplasm of breast: Secondary | ICD-10-CM | POA: Insufficient documentation

## 2024-01-05 DIAGNOSIS — Z8673 Personal history of transient ischemic attack (TIA), and cerebral infarction without residual deficits: Secondary | ICD-10-CM | POA: Diagnosis not present

## 2024-01-05 DIAGNOSIS — Z79899 Other long term (current) drug therapy: Secondary | ICD-10-CM | POA: Diagnosis not present
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
## 2024-01-06 ENCOUNTER — Inpatient Hospital Stay: Payer: Medicare HMO | Admitting: Oncology

## 2024-02-17 ENCOUNTER — Other Ambulatory Visit (HOSPITAL_COMMUNITY): Payer: Self-pay | Admitting: Family Medicine

## 2024-02-17 DIAGNOSIS — Z1231 Encounter for screening mammogram for malignant neoplasm of breast: Secondary | ICD-10-CM

## 2024-02-23 DIAGNOSIS — E039 Hypothyroidism, unspecified: Secondary | ICD-10-CM | POA: Diagnosis not present

## 2024-02-23 DIAGNOSIS — Z6825 Body mass index (BMI) 25.0-25.9, adult: Secondary | ICD-10-CM | POA: Diagnosis not present

## 2024-02-23 DIAGNOSIS — I10 Essential (primary) hypertension: Secondary | ICD-10-CM | POA: Diagnosis not present

## 2024-02-23 DIAGNOSIS — E663 Overweight: Secondary | ICD-10-CM | POA: Diagnosis not present

## 2024-02-23 DIAGNOSIS — F419 Anxiety disorder, unspecified: Secondary | ICD-10-CM | POA: Diagnosis not present

## 2024-02-23 DIAGNOSIS — E782 Mixed hyperlipidemia: Secondary | ICD-10-CM | POA: Diagnosis not present

## 2024-02-23 DIAGNOSIS — J302 Other seasonal allergic rhinitis: Secondary | ICD-10-CM | POA: Diagnosis not present

## 2024-02-23 DIAGNOSIS — I639 Cerebral infarction, unspecified: Secondary | ICD-10-CM | POA: Diagnosis not present

## 2024-02-23 DIAGNOSIS — M797 Fibromyalgia: Secondary | ICD-10-CM | POA: Diagnosis not present

## 2024-03-22 DIAGNOSIS — E663 Overweight: Secondary | ICD-10-CM | POA: Diagnosis not present

## 2024-03-22 DIAGNOSIS — E039 Hypothyroidism, unspecified: Secondary | ICD-10-CM | POA: Diagnosis not present

## 2024-03-22 DIAGNOSIS — E538 Deficiency of other specified B group vitamins: Secondary | ICD-10-CM | POA: Diagnosis not present

## 2024-03-22 DIAGNOSIS — M797 Fibromyalgia: Secondary | ICD-10-CM | POA: Diagnosis not present

## 2024-03-22 DIAGNOSIS — Z6826 Body mass index (BMI) 26.0-26.9, adult: Secondary | ICD-10-CM | POA: Diagnosis not present

## 2024-03-27 ENCOUNTER — Ambulatory Visit (HOSPITAL_COMMUNITY)
Admission: RE | Admit: 2024-03-27 | Discharge: 2024-03-27 | Disposition: A | Discharge status: Home / Self Care | Source: Ambulatory Visit | Attending: Family Medicine | Admitting: Family Medicine

## 2024-03-27 DIAGNOSIS — Z1231 Encounter for screening mammogram for malignant neoplasm of breast: Secondary | ICD-10-CM | POA: Diagnosis not present

## 2024-04-05 ENCOUNTER — Encounter: Payer: Self-pay | Admitting: Hematology

## 2024-04-05 DIAGNOSIS — M7061 Trochanteric bursitis, right hip: Secondary | ICD-10-CM | POA: Diagnosis not present
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
## 2024-04-10 NOTE — Patient Instructions (Incomplete)

## 2024-04-12 ENCOUNTER — Ambulatory Visit (INDEPENDENT_AMBULATORY_CARE_PROVIDER_SITE_OTHER): Payer: Medicare HMO | Admitting: Family Medicine

## 2024-04-12 ENCOUNTER — Encounter: Payer: Self-pay | Admitting: Family Medicine

## 2024-04-12 VITALS — BP 127/82 | HR 105 | Ht 62.0 in | Wt 145.5 lb

## 2024-04-12 DIAGNOSIS — G459 Transient cerebral ischemic attack, unspecified: Secondary | ICD-10-CM

## 2024-04-12 DIAGNOSIS — D649 Anemia, unspecified: Secondary | ICD-10-CM

## 2024-04-12 MED ORDER — GABAPENTIN 100 MG PO CAPS: 100.0000 mg | ORAL_CAPSULE | Freq: Every day | ORAL | 3 refills | Status: DC

## 2024-04-13 ENCOUNTER — Inpatient Hospital Stay: Attending: Hematology

## 2024-04-13 DIAGNOSIS — D509 Iron deficiency anemia, unspecified: Secondary | ICD-10-CM | POA: Insufficient documentation

## 2024-04-13 LAB — IRON AND TIBC
Iron: 70 ug/dL (ref 28–170)
Saturation Ratios: 21 % (ref 10.4–31.8)
TIBC: 336 ug/dL (ref 250–450)
UIBC: 266 ug/dL

## 2024-04-13 LAB — CBC
HCT: 38.7 % (ref 36.0–46.0)
Hemoglobin: 13.5 g/dL (ref 12.0–15.0)
MCH: 31.7 pg (ref 26.0–34.0)
MCHC: 34.9 g/dL (ref 30.0–36.0)
MCV: 90.8 fL (ref 80.0–100.0)
Platelets: 270 K/uL (ref 150–400)
RBC: 4.26 MIL/uL (ref 3.87–5.11)
RDW: 12.8 % (ref 11.5–15.5)
WBC: 9.4 K/uL (ref 4.0–10.5)
nRBC: 0 % (ref 0.0–0.2)

## 2024-04-13 LAB — FERRITIN: Ferritin: 136 ng/mL (ref 11–307)

## 2024-04-14 DIAGNOSIS — M25551 Pain in right hip: Secondary | ICD-10-CM | POA: Diagnosis not present

## 2024-04-20 ENCOUNTER — Inpatient Hospital Stay (HOSPITAL_BASED_OUTPATIENT_CLINIC_OR_DEPARTMENT_OTHER): Admitting: Oncology

## 2024-04-20 DIAGNOSIS — D509 Iron deficiency anemia, unspecified: Secondary | ICD-10-CM
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
## 2024-04-26 ENCOUNTER — Inpatient Hospital Stay

## 2024-04-26 VITALS — BP 119/63 | HR 70 | Temp 97.9°F | Resp 18

## 2024-04-26 DIAGNOSIS — D509 Iron deficiency anemia, unspecified: Secondary | ICD-10-CM

## 2024-04-26 MED ORDER — IRON SUCROSE 500 MG IVPB - SIMPLE MED
500.0000 mg | Freq: Once | INTRAVENOUS | Status: AC
Start: 2024-04-26 — End: 2024-04-26
  Administered 2024-04-26: 500 mg via INTRAVENOUS
  Filled 2024-04-26: qty 300

## 2024-04-26 MED ORDER — CETIRIZINE HCL 10 MG/ML IV SOLN
10.0000 mg | Freq: Once | INTRAVENOUS | Status: AC
  Administered 2024-04-26: 10 mg via INTRAVENOUS
  Filled 2024-04-26: qty 1

## 2024-04-26 MED ORDER — SODIUM CHLORIDE 0.9 % IV SOLN
INTRAVENOUS | Status: DC
Start: 2024-04-26 — End: 2024-04-26

## 2024-04-26 MED ORDER — ACETAMINOPHEN 325 MG PO TABS
650.0000 mg | ORAL_TABLET | Freq: Once | ORAL | Status: AC
  Administered 2024-04-26: 650 mg via ORAL
  Filled 2024-04-26: qty 2

## 2024-04-26 MED ORDER — IRON SUCROSE 20 MG/ML IV SOLN: 400.0000 mg | Freq: Once | INTRAVENOUS | Status: DC

## 2024-04-26 NOTE — Patient Instructions (Signed)
 [facility] - A DEPT OF [facility]  Discharge Instructions: Thank you for choosing [facility] to provide your oncology and hematology care.  If you have a lab appointment with the Cancer Center - please note that after April 8th, 2024, all labs will be drawn in the cancer center.  You do not have to check in or register with the main entrance as you have in the past but will complete your check-in in the cancer center.  Wear comfortable clothing and clothing appropriate for easy access to any Portacath or PICC line.   We strive to give you quality time with your provider. You may need to reschedule your appointment if you arrive late (15 or more minutes).  Arriving late affects you and other patients whose appointments are after yours.  Also, if you miss three or more appointments without notifying the office, you may be dismissed from the clinic at the provider's discretion.      For prescription refill requests, have your pharmacy contact our office and allow 72 hours for refills to be completed.    Today you received Venofer  500 mg IV iron  infusion     BELOW ARE SYMPTOMS THAT SHOULD BE REPORTED IMMEDIATELY: *FEVER GREATER THAN 100.4 F (38 C) OR HIGHER *CHILLS OR SWEATING *NAUSEA AND VOMITING THAT IS NOT CONTROLLED WITH YOUR NAUSEA MEDICATION *UNUSUAL SHORTNESS OF BREATH *UNUSUAL BRUISING OR BLEEDING *URINARY PROBLEMS (pain or burning when urinating, or frequent urination) *BOWEL PROBLEMS (unusual diarrhea, constipation, pain near the anus) TENDERNESS IN MOUTH AND THROAT WITH OR WITHOUT PRESENCE OF ULCERS (sore throat, sores in mouth, or a toothache) UNUSUAL RASH, SWELLING OR PAIN  UNUSUAL VAGINAL DISCHARGE OR ITCHING   Items with * indicate a potential emergency and should be followed up as soon as possible or go to the Emergency Department if any problems should occur.  Please show the CHEMOTHERAPY ALERT CARD or IMMUNOTHERAPY ALERT  CARD at check-in to the Emergency Department and triage nurse.  Should you have questions after your visit or need to cancel or reschedule your appointment, please contact [facility] [place] - A DEPT OF [facility] [phone]  and follow the prompts.  Office hours are 8:00 a.m. to 4:30 p.m. Monday - Friday. Please note that voicemails left after 4:00 p.m. may not be returned until the following business day.  We are closed weekends and major holidays. You have access to a nurse at all times for urgent questions. Please call the main number to the clinic [phone] and follow the prompts.  For any non-urgent questions, you may also contact your provider using MyChart. We now offer e-Visits for anyone 58 and older to request care online for non-urgent symptoms. For details visit [email or web address].   Also download the MyChart app! Go to the app store, search "MyChart", open the app, select [place], and log in with your MyChart username and password.

## 2024-04-26 NOTE — Progress Notes (Signed)
Patient presents today for iron infusion. Patient is in satisfactory condition with no new complaints voiced.  Vital signs are stable.  We will proceed with infusion per provider orders.    Peripheral IV started with good blood return pre and post infusion.  Venofer 500 mg given today per MD orders. Tolerated infusion without adverse affects. Vital signs stable. No complaints at this time. Discharged from clinic ambulatory in stable condition. Alert and oriented x 3. F/U with [facility] as scheduled.   

## 2024-04-28 DIAGNOSIS — S76011A Strain of muscle, fascia and tendon of right hip, initial encounter: Secondary | ICD-10-CM | POA: Diagnosis not present

## 2024-05-08 ENCOUNTER — Encounter (HOSPITAL_BASED_OUTPATIENT_CLINIC_OR_DEPARTMENT_OTHER): Payer: Self-pay

## 2024-05-08 ENCOUNTER — Emergency Department (HOSPITAL_BASED_OUTPATIENT_CLINIC_OR_DEPARTMENT_OTHER)
Admission: EM | Admit: 2024-05-08 | Discharge: 2024-05-08 | Disposition: A | Discharge status: Home / Self Care | Attending: Emergency Medicine | Admitting: Emergency Medicine

## 2024-05-08 ENCOUNTER — Emergency Department (HOSPITAL_BASED_OUTPATIENT_CLINIC_OR_DEPARTMENT_OTHER): Admitting: Radiology

## 2024-05-08 ENCOUNTER — Other Ambulatory Visit: Payer: Self-pay

## 2024-05-08 DIAGNOSIS — Z96641 Presence of right artificial hip joint: Secondary | ICD-10-CM | POA: Diagnosis not present

## 2024-05-08 DIAGNOSIS — M25551 Pain in right hip: Secondary | ICD-10-CM | POA: Insufficient documentation

## 2024-05-08 DIAGNOSIS — M25531 Pain in right wrist: Secondary | ICD-10-CM | POA: Diagnosis not present

## 2024-05-08 DIAGNOSIS — M79641 Pain in right hand: Secondary | ICD-10-CM | POA: Diagnosis not present

## 2024-05-08 DIAGNOSIS — Z23 Encounter for immunization: Secondary | ICD-10-CM | POA: Diagnosis not present

## 2024-05-08 DIAGNOSIS — S5001XA Contusion of right elbow, initial encounter: Secondary | ICD-10-CM | POA: Diagnosis not present

## 2024-05-08 DIAGNOSIS — W19XXXA Unspecified fall, initial encounter: Secondary | ICD-10-CM

## 2024-05-08 DIAGNOSIS — W010XXA Fall on same level from slipping, tripping and stumbling without subsequent striking against object, initial encounter: Secondary | ICD-10-CM | POA: Diagnosis not present

## 2024-05-08 DIAGNOSIS — M799 Soft tissue disorder, unspecified: Secondary | ICD-10-CM | POA: Diagnosis not present

## 2024-05-08 DIAGNOSIS — I878 Other specified disorders of veins: Secondary | ICD-10-CM | POA: Diagnosis not present

## 2024-05-08 DIAGNOSIS — M25521 Pain in right elbow: Secondary | ICD-10-CM | POA: Diagnosis not present

## 2024-05-08 DIAGNOSIS — S50901A Unspecified superficial injury of right elbow, initial encounter: Secondary | ICD-10-CM | POA: Diagnosis not present

## 2024-05-08 MED ORDER — BACITRACIN ZINC 500 UNIT/GM EX OINT
TOPICAL_OINTMENT | Freq: Two times a day (BID) | CUTANEOUS | Status: DC
  Filled 2024-05-08: qty 28.35

## 2024-05-08 MED ORDER — TETANUS-DIPHTH-ACELL PERTUSSIS 5-2.5-18.5 LF-MCG/0.5 IM SUSY
0.5000 mL | PREFILLED_SYRINGE | Freq: Once | INTRAMUSCULAR | Status: AC
  Administered 2024-05-08: 0.5 mL via INTRAMUSCULAR
  Filled 2024-05-08: qty 0.5

## 2024-05-08 NOTE — ED Provider Notes (Signed)
 [place] EMERGENCY DEPARTMENT AT [facility] Provider Note   CSN: [id number] Arrival date & time: 05/08/24  1651     Patient presents with: Sarah [name] is a 73 y.o. female.   73 yo F with a chief complaint of a fall.  The patient states she was sweeping the road and she lost her balance and fell.  Required help to get up.  Was complaining of right hip pain right hand pain and right elbow pain.  Denies head injury denies loss consciousness denies neck pain back pain chest pain abdominal pain.   Fall       Prior to Admission medications   Medication Sig Start Date End Date Taking? Authorizing Provider  acetaminophen  (TYLENOL ) 650 MG CR tablet Take 1,300 mg by mouth every 8 (eight) hours as needed (pain.).    [provider]  ALPRAZolam  (XANAX ) 0.5 MG tablet Take 0.5 mg by mouth at bedtime. 03/15/20   [provider]  atorvastatin  (LIPITOR) 20 MG tablet Take 1 tablet (20 mg total) by mouth daily. 10/03/23 10/02/24  Cheryle Page, MD  cyclobenzaprine  (FLEXERIL ) 10 MG tablet Take 10 mg by mouth 3 (three) times daily as needed for muscle spasms. 02/17/23   [provider]  esomeprazole (NEXIUM) 20 MG capsule Take 20 mg by mouth daily before breakfast.    [provider]  furosemide (LASIX) 40 MG tablet Take 40 mg by mouth 2 (two) times daily. 08/05/23   [provider]  gabapentin  (NEURONTIN ) 100 MG capsule Take 1 capsule (100 mg total) by mouth at bedtime. 04/12/24   Lomax, Amy, NP  IBSRELA  50 MG TABS Take 1 tablet by mouth 2 (two) times daily. 09/03/23   [provider]  LINZESS 290 MCG CAPS capsule Take 290 mcg by mouth every morning. 12/07/23   [provider]  phentermine  (ADIPEX-P ) 37.5 MG tablet Take 18.75 mg by mouth every morning. 02/13/23   [provider]  potassium chloride  SA (KLOR-CON  M) 20 MEQ tablet Take 20 mEq by mouth 2 (two) times daily. 10/11/23   [provider]  SYNTHROID   75 MCG tablet Take 75 mcg by mouth daily. 10/11/23   [provider]  traMADol  (ULTRAM ) 50 MG tablet Take 50 mg by mouth every 4 (four) hours as needed (pain.). 03/01/23   [provider]    Allergies: Penicillins    Review of Systems  Updated Vital Signs BP (!) 145/82 (BP Location: Left Arm)   Pulse 78   Temp 97.8 F (36.6 C) (Oral)   Resp 20   Ht 5' 2 (1.575 m)   Wt 63.5 kg   SpO2 99%   BMI 25.61 kg/m   Physical Exam Vitals and nursing note reviewed.  Constitutional:      General: She is not in acute distress.    Appearance: She is well-developed. She is not diaphoretic.  HENT:     Head: Normocephalic and atraumatic.  Eyes:     Pupils: Pupils are equal, round, and reactive to light.  Cardiovascular:     Rate and Rhythm: Normal rate and regular rhythm.     Heart sounds: No murmur heard.    No friction rub. No gallop.  Pulmonary:     Effort: Pulmonary effort is normal.     Breath sounds: No wheezing or rales.  Abdominal:     General: There is no distension.     Palpations: Abdomen is soft.     Tenderness: There is  no abdominal tenderness.  Musculoskeletal:        General: No tenderness.     Cervical back: Normal range of motion and neck supple.     Comments: Abrasion to the right palmar aspect about the MCP of the fifth digit.  Superficial.  Abrasion to the right lateral elbow.  She has some pain with making a fist.  Otherwise pulse motor and sensation intact to the right upper extremity.  No obvious pain at the snuffbox.  Able to range the wrist without significant discomfort.  Able to range the elbow without significant discomfort.  Able to internally and externally rotate the right lower extremity without obvious discomfort.  No obvious pelvic instability.  Skin:    General: Skin is warm and dry.  Neurological:     Mental Status: She is alert and oriented to person, place, and time.  Psychiatric:        Behavior: Behavior normal.     (all  labs ordered are listed, but only abnormal results are displayed) Labs Reviewed - No data to display  EKG: None  Radiology: DG Hand 2 View Right Result Date: 05/08/2024 CLINICAL DATA:  Pain after injury. EXAM: RIGHT HAND - 2 VIEW; RIGHT WRIST - COMPLETE 3+ VIEW COMPARISON:  None Available. FINDINGS: Hand: No fracture or dislocation. Alignment is maintained. Occasional osteophytes with preservation of joint spaces. Slight soft tissue edema overlies the distal metacarpals. Wrist: No acute fracture or dislocation. Carpal bones are intact. No focal bone abnormality or erosion. No focal soft tissue abnormalities. IMPRESSION: 1. No fracture or dislocation of the right hand or wrist. 2. Mild soft tissue edema overlying the distal metacarpals. Electronically Signed   By: Andrea Gasman M.D.   On: 05/08/2024 18:14   DG Wrist Complete Right Result Date: 05/08/2024 CLINICAL DATA:  Pain after injury. EXAM: RIGHT HAND - 2 VIEW; RIGHT WRIST - COMPLETE 3+ VIEW COMPARISON:  None Available. FINDINGS: Hand: No fracture or dislocation. Alignment is maintained. Occasional osteophytes with preservation of joint spaces. Slight soft tissue edema overlies the distal metacarpals. Wrist: No acute fracture or dislocation. Carpal bones are intact. No focal bone abnormality or erosion. No focal soft tissue abnormalities. IMPRESSION: 1. No fracture or dislocation of the right hand or wrist. 2. Mild soft tissue edema overlying the distal metacarpals. Electronically Signed   By: Andrea Gasman M.D.   On: 05/08/2024 18:14   DG Hip Unilat W or Wo Pelvis 2-3 Views Right Result Date: 05/08/2024 CLINICAL DATA:  Pain after fall. EXAM: DG HIP (WITH OR WITHOUT PELVIS) 2-3V RIGHT COMPARISON:  None Available. FINDINGS: Right hip arthroplasty in expected alignment. No periprosthetic lucency. No acute or periprosthetic fracture. Pubic rami are intact. Pubic symphysis and sacroiliac joints are congruent. Pelvic phleboliths. IMPRESSION: Right hip  arthroplasty without complication or acute fracture. Electronically Signed   By: Andrea Gasman M.D.   On: 05/08/2024 18:13   DG Elbow 2 Views Right Result Date: 05/08/2024 CLINICAL DATA:  Pain after fall. EXAM: RIGHT ELBOW - 2 VIEW COMPARISON:  None Available. FINDINGS: There is no evidence of fracture, dislocation, or joint effusion. Chronic corticated densities adjacent to the medial and lateral malleoli. Mild soft tissue thickening posteriorly. IMPRESSION: 1. No acute fracture or dislocation of the right elbow. 2. Mild soft tissue thickening posteriorly. Electronically Signed   By: Andrea Gasman M.D.   On: 05/08/2024 18:13     Procedures   Medications Ordered in the ED  bacitracin  ointment (has no administration in time range)  Tdap (BOOSTRIX ) injection 0.5 mL (has no administration in time range)                                    Medical Decision Making Amount and/or Complexity of Data Reviewed Radiology: ordered.  Risk OTC drugs.   73 yo F with a chief complaint of a fall.  Nonsyncopal by history.  Complaining mostly of right hand pain.  She denies head injury denies loss consciousness denies neck pain chest pain back pain abdominal pain.  Plain film of the right hand independently interpreted by me without obvious fracture or foreign body.  Plain film of the right wrist independently interpreted by me without obvious fracture or dislocation.  Plain film of the right elbow independently interpreted by me without fracture.  Plain film of the right hip with intact hardware, no obvious periprosthetic fracture.  6:32 PM:  I have discussed the diagnosis/risks/treatment options with the patient and family.  Evaluation and diagnostic testing in the emergency department does not suggest an emergent condition requiring admission or immediate intervention beyond what has been performed at this time.  They will follow up with PCP, ortho. We also discussed returning to the ED immediately  if new or worsening sx occur. We discussed the sx which are most concerning (e.g., sudden worsening pain, fever, inability to tolerate by mouth) that necessitate immediate return. Medications administered to the patient during their visit and any new prescriptions provided to the patient are listed below.  Medications given during this visit Medications  bacitracin  ointment (has no administration in time range)  Tdap (BOOSTRIX ) injection 0.5 mL (has no administration in time range)     The patient appears reasonably screen and/or stabilized for discharge and I doubt any other medical condition or other [facility] requiring further screening, evaluation, or treatment in the ED at this time prior to discharge.       Final diagnoses:  Fall, initial encounter    ED Discharge Orders     None          Emil Share, [place] 05/08/24 1832

## 2024-05-08 NOTE — Discharge Instructions (Signed)
 Return for redness, drainage, fever. Follow up with your doctor in the office.   Take 4 over the counter ibuprofen  tablets 3 times a day or 2 over-the-counter naproxen tablets twice a day for pain. Also take tylenol  1000mg (2 extra strength) four times a day.

## 2024-05-08 NOTE — ED Notes (Signed)
 Dc instructions reviewed with patient. Patient voiced understanding. Dc with belongings.

## 2024-05-08 NOTE — ED Triage Notes (Signed)
 Pt presents with injuries from a mechanical fall. Denies dizziness or lightheadedness as the cause. Pt has injury to RUE and R hip. Denies head injury or LOC.

## 2024-05-23 ENCOUNTER — Encounter (HOSPITAL_BASED_OUTPATIENT_CLINIC_OR_DEPARTMENT_OTHER): Payer: Self-pay | Admitting: Orthopedic Surgery

## 2024-05-23 ENCOUNTER — Other Ambulatory Visit: Payer: Self-pay
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
## 2024-05-24 ENCOUNTER — Encounter (HOSPITAL_BASED_OUTPATIENT_CLINIC_OR_DEPARTMENT_OTHER)
Admission: RE | Admit: 2024-05-24 | Discharge: 2024-05-24 | Disposition: A | Discharge status: Home / Self Care | Source: Ambulatory Visit | Attending: Orthopedic Surgery | Admitting: Orthopedic Surgery

## 2024-05-24 DIAGNOSIS — Z01812 Encounter for preprocedural laboratory examination: Secondary | ICD-10-CM | POA: Diagnosis not present

## 2024-05-24 DIAGNOSIS — I10 Essential (primary) hypertension: Secondary | ICD-10-CM | POA: Insufficient documentation

## 2024-05-24 LAB — BASIC METABOLIC PANEL WITH GFR
Anion gap: 14 (ref 5–15)
BUN: 12 mg/dL (ref 8–23)
CO2: 21 mmol/L — ABNORMAL LOW (ref 22–32)
Calcium: 9.2 mg/dL (ref 8.9–10.3)
Chloride: 106 mmol/L (ref 98–111)
Creatinine, Ser: 0.7 mg/dL (ref 0.44–1.00)
GFR, Estimated: >60 mL/min (ref >60)
Glucose, Bld: 97 mg/dL (ref 70–99)
Potassium: 3.9 mmol/L (ref 3.5–5.1)
Sodium: 141 mmol/L (ref 135–145)

## 2024-05-24 NOTE — Progress Notes (Signed)
 Ensure presurgery drink given with written and verbal instruction to drink by 0400 on DOS. Pt verbalized understanding.         Enhanced Recovery after Surgery for Orthopedics Enhanced Recovery after Surgery is a protocol used to improve the stress on your body and your recovery after surgery.  Patient Instructions  The night before surgery:  No food after midnight. ONLY clear liquids after midnight  The day of surgery (if you do NOT have diabetes):  Drink ONE (1) Pre-Surgery Clear Ensure as directed.   This drink was given to you during your hospital  pre-op appointment visit. The pre-op nurse will instruct you on the time to drink the  Pre-Surgery Ensure depending on your surgery time. Finish the drink at the designated time by the pre-op nurse.  Nothing else to drink after completing the  Pre-Surgery Clear Ensure.  The day of surgery (if you have diabetes): Drink ONE (1) Gatorade 2 (G2) as directed. This drink was given to you during your hospital  pre-op appointment visit.  The pre-op nurse will instruct you on the time to drink the   Gatorade 2 (G2) depending on your surgery time. Color of the Gatorade may vary. Red is not allowed. Nothing else to drink after completing the  Gatorade 2 (G2).         If you have questions, please contact your surgeon's office.

## 2024-05-30 ENCOUNTER — Encounter (HOSPITAL_BASED_OUTPATIENT_CLINIC_OR_DEPARTMENT_OTHER)
Admission: RE | Disposition: A | Discharge status: Home / Self Care | Payer: Self-pay | Source: Home / Self Care | Attending: Orthopedic Surgery

## 2024-05-30 ENCOUNTER — Other Ambulatory Visit: Payer: Self-pay

## 2024-05-30 ENCOUNTER — Encounter (HOSPITAL_BASED_OUTPATIENT_CLINIC_OR_DEPARTMENT_OTHER): Payer: Self-pay | Admitting: Orthopedic Surgery

## 2024-05-30 ENCOUNTER — Ambulatory Visit (HOSPITAL_BASED_OUTPATIENT_CLINIC_OR_DEPARTMENT_OTHER): Payer: Self-pay | Admitting: Certified Registered"

## 2024-05-30 ENCOUNTER — Encounter (HOSPITAL_BASED_OUTPATIENT_CLINIC_OR_DEPARTMENT_OTHER): Payer: Self-pay | Admitting: Certified Registered"

## 2024-05-30 ENCOUNTER — Ambulatory Visit (HOSPITAL_BASED_OUTPATIENT_CLINIC_OR_DEPARTMENT_OTHER)
Admission: RE | Admit: 2024-05-30 | Discharge: 2024-05-30 | Disposition: A | Discharge status: Home / Self Care | Attending: Orthopedic Surgery | Admitting: Orthopedic Surgery

## 2024-05-30 DIAGNOSIS — M7061 Trochanteric bursitis, right hip: Secondary | ICD-10-CM | POA: Diagnosis not present

## 2024-05-30 DIAGNOSIS — Z79899 Other long term (current) drug therapy: Secondary | ICD-10-CM | POA: Diagnosis not present

## 2024-05-30 DIAGNOSIS — E039 Hypothyroidism, unspecified: Secondary | ICD-10-CM | POA: Insufficient documentation

## 2024-05-30 DIAGNOSIS — Z8673 Personal history of transient ischemic attack (TIA), and cerebral infarction without residual deficits: Secondary | ICD-10-CM | POA: Diagnosis not present

## 2024-05-30 DIAGNOSIS — I10 Essential (primary) hypertension: Secondary | ICD-10-CM

## 2024-05-30 DIAGNOSIS — S76011A Strain of muscle, fascia and tendon of right hip, initial encounter: Secondary | ICD-10-CM | POA: Diagnosis not present

## 2024-05-30 DIAGNOSIS — S76312A Strain of muscle, fascia and tendon of the posterior muscle group at thigh level, left thigh, initial encounter: Secondary | ICD-10-CM

## 2024-05-30 DIAGNOSIS — Z79890 Hormone replacement therapy (postmenopausal): Secondary | ICD-10-CM | POA: Diagnosis not present

## 2024-05-30 DIAGNOSIS — K219 Gastro-esophageal reflux disease without esophagitis: Secondary | ICD-10-CM | POA: Insufficient documentation

## 2024-05-30 DIAGNOSIS — M797 Fibromyalgia: Secondary | ICD-10-CM | POA: Diagnosis not present

## 2024-05-30 HISTORY — DX: Gastro-esophageal reflux disease without esophagitis: K21.9

## 2024-05-30 HISTORY — PX: GLUTEUS MINIMUS REPAIR: SHX5843

## 2024-05-30 HISTORY — DX: Essential (primary) hypertension: I10

## 2024-05-30 SURGERY — REPAIR, TENDON, GLUTEUS MINIMUS
Anesthesia: General | Site: Hip | Laterality: Right

## 2024-05-30 MED ORDER — BACLOFEN 10 MG PO TABS: 10.0000 mg | ORAL_TABLET | Freq: Three times a day (TID) | ORAL | 0 refills | Status: DC | PRN

## 2024-05-30 MED ORDER — ONDANSETRON HCL 4 MG/2ML IJ SOLN: 4.0000 mg | Freq: Once | INTRAMUSCULAR | Status: DC | PRN

## 2024-05-30 MED ORDER — FENTANYL CITRATE (PF) 100 MCG/2ML IJ SOLN
INTRAMUSCULAR | Status: AC
Start: 2024-05-30 — End: 2024-05-30
  Filled 2024-05-30: qty 2

## 2024-05-30 MED ORDER — PROPOFOL 10 MG/ML IV BOLUS
INTRAVENOUS | Status: AC
Start: 2024-05-30 — End: 2024-05-30
  Filled 2024-05-30: qty 20

## 2024-05-30 MED ORDER — CEFAZOLIN SODIUM-DEXTROSE 2-4 GM/100ML-% IV SOLN
2.0000 g | INTRAVENOUS | Status: AC
  Administered 2024-05-30: 2 g via INTRAVENOUS

## 2024-05-30 MED ORDER — VANCOMYCIN HCL 500 MG IV SOLR
INTRAVENOUS | Status: AC
  Filled 2024-05-30: qty 10

## 2024-05-30 MED ORDER — PROPOFOL 10 MG/ML IV BOLUS
INTRAVENOUS | Status: AC
  Filled 2024-05-30: qty 20

## 2024-05-30 MED ORDER — DEXAMETHASONE SODIUM PHOSPHATE 4 MG/ML IJ SOLN
INTRAMUSCULAR | Status: DC | PRN
  Administered 2024-05-30: 5 mg via INTRAVENOUS

## 2024-05-30 MED ORDER — ROCURONIUM BROMIDE 10 MG/ML (PF) SYRINGE
PREFILLED_SYRINGE | INTRAVENOUS | Status: AC
  Filled 2024-05-30: qty 10

## 2024-05-30 MED ORDER — 0.9 % SODIUM CHLORIDE (POUR BTL) OPTIME
TOPICAL | Status: DC | PRN
Start: 2024-05-30 — End: 2024-05-30
  Administered 2024-05-30: 1000 mL

## 2024-05-30 MED ORDER — ASPIRIN 81 MG PO TBEC: 81.0000 mg | DELAYED_RELEASE_TABLET | Freq: Two times a day (BID) | ORAL | 0 refills | Status: DC

## 2024-05-30 MED ORDER — ONDANSETRON 4 MG PO TBDP: 4.0000 mg | ORAL_TABLET | Freq: Three times a day (TID) | ORAL | 0 refills | Status: DC | PRN

## 2024-05-30 MED ORDER — ACETAMINOPHEN 500 MG PO TABS
ORAL_TABLET | ORAL | Status: AC
Start: 2024-05-30 — End: 2024-05-30
  Filled 2024-05-30: qty 2

## 2024-05-30 MED ORDER — LIDOCAINE 2% (20 MG/ML) 5 ML SYRINGE
INTRAMUSCULAR | Status: AC
  Filled 2024-05-30: qty 5

## 2024-05-30 MED ORDER — BUPIVACAINE HCL (PF) 0.25 % IJ SOLN
INTRAMUSCULAR | Status: AC
  Filled 2024-05-30: qty 30

## 2024-05-30 MED ORDER — POVIDONE-IODINE 10 % EX SWAB
2.0000 | Freq: Once | CUTANEOUS | Status: AC
  Administered 2024-05-30: 2 via TOPICAL

## 2024-05-30 MED ORDER — FENTANYL CITRATE (PF) 100 MCG/2ML IJ SOLN
25.0000 ug | INTRAMUSCULAR | Status: DC | PRN
  Administered 2024-05-30 (×2): 50 ug via INTRAVENOUS

## 2024-05-30 MED ORDER — ROCURONIUM BROMIDE 100 MG/10ML IV SOLN
INTRAVENOUS | Status: DC | PRN
  Administered 2024-05-30: 50 mg via INTRAVENOUS

## 2024-05-30 MED ORDER — FENTANYL CITRATE (PF) 100 MCG/2ML IJ SOLN
INTRAMUSCULAR | Status: DC | PRN
  Administered 2024-05-30: 100 ug via INTRAVENOUS

## 2024-05-30 MED ORDER — DEXAMETHASONE SODIUM PHOSPHATE 10 MG/ML IJ SOLN
INTRAMUSCULAR | Status: AC
  Filled 2024-05-30: qty 1

## 2024-05-30 MED ORDER — ACETAMINOPHEN 500 MG PO TABS
1000.0000 mg | ORAL_TABLET | Freq: Once | ORAL | Status: AC
  Administered 2024-05-30: 1000 mg via ORAL

## 2024-05-30 MED ORDER — CEFAZOLIN SODIUM-DEXTROSE 2-4 GM/100ML-% IV SOLN
INTRAVENOUS | Status: AC
  Filled 2024-05-30: qty 100

## 2024-05-30 MED ORDER — FENTANYL CITRATE (PF) 100 MCG/2ML IJ SOLN
INTRAMUSCULAR | Status: AC
  Filled 2024-05-30: qty 2

## 2024-05-30 MED ORDER — OXYCODONE HCL 5 MG PO TABS
5.0000 mg | ORAL_TABLET | Freq: Once | ORAL | Status: AC
  Administered 2024-05-30: 5 mg via ORAL

## 2024-05-30 MED ORDER — PHENYLEPHRINE HCL (PRESSORS) 10 MG/ML IV SOLN
INTRAVENOUS | Status: DC | PRN
  Administered 2024-05-30: 80 ug via INTRAVENOUS

## 2024-05-30 MED ORDER — PROPOFOL 10 MG/ML IV BOLUS
INTRAVENOUS | Status: DC | PRN
Start: 2024-05-30 — End: 2024-05-30
  Administered 2024-05-30: 130 mg via INTRAVENOUS

## 2024-05-30 MED ORDER — VANCOMYCIN HCL 500 MG IV SOLR
INTRAVENOUS | Status: DC | PRN
  Administered 2024-05-30: 500 mg via TOPICAL

## 2024-05-30 MED ORDER — LACTATED RINGERS IV SOLN: INTRAVENOUS | Status: DC

## 2024-05-30 MED ORDER — ONDANSETRON HCL 4 MG/2ML IJ SOLN
INTRAMUSCULAR | Status: AC
  Filled 2024-05-30: qty 2

## 2024-05-30 MED ORDER — MELOXICAM 15 MG PO TABS: 15.0000 mg | ORAL_TABLET | Freq: Every day | ORAL | 0 refills | Status: DC | PRN

## 2024-05-30 MED ORDER — HYDROCODONE-ACETAMINOPHEN 10-325 MG PO TABS
1.0000 | ORAL_TABLET | Freq: Four times a day (QID) | ORAL | 0 refills | Status: DC | PRN
Start: 2024-05-30 — End: 2024-07-17

## 2024-05-30 MED ORDER — ONDANSETRON HCL 4 MG/2ML IJ SOLN
INTRAMUSCULAR | Status: DC | PRN
  Administered 2024-05-30: 4 mg via INTRAVENOUS

## 2024-05-30 MED ORDER — OXYCODONE HCL 5 MG PO TABS
ORAL_TABLET | ORAL | Status: AC
  Filled 2024-05-30: qty 1

## 2024-05-30 MED ORDER — SUGAMMADEX SODIUM 200 MG/2ML IV SOLN
INTRAVENOUS | Status: DC | PRN
  Administered 2024-05-30: 260 mg via INTRAVENOUS

## 2024-05-30 MED ORDER — LIDOCAINE HCL (CARDIAC) PF 100 MG/5ML IV SOSY
PREFILLED_SYRINGE | INTRAVENOUS | Status: DC | PRN
  Administered 2024-05-30: 70 mg via INTRAVENOUS

## 2024-05-30 SURGICAL SUPPLY — 47 items
ANCHOR SUT BIO SW 4.75X19.1 (Anchor) IMPLANT
BLADE SURG 10 STRL SS (BLADE) ×1 IMPLANT
BLADE SURG 15 STRL LF DISP TIS (BLADE) ×1 IMPLANT
CANISTER SUCT 1200ML W/VALVE (MISCELLANEOUS) ×1 IMPLANT
CHLORAPREP W/TINT 26 (MISCELLANEOUS) ×1 IMPLANT
CLSR STERI-STRIP ANTIMIC 1/2X4 (GAUZE/BANDAGES/DRESSINGS) IMPLANT
COVER BACK TABLE 60X90IN (DRAPES) ×1 IMPLANT
COVER MAYO STAND STRL (DRAPES) ×1 IMPLANT
DERMABOND ADVANCED .7 DNX12 (GAUZE/BANDAGES/DRESSINGS) IMPLANT
DRAPE IMP U-DRAPE 54X76 (DRAPES) IMPLANT
DRAPE STERI IOBAN 125X83 (DRAPES) ×1 IMPLANT
DRAPE U-SHAPE 47X51 STRL (DRAPES) ×1 IMPLANT
DRSG AQUACEL AG ADV 3.5X 6 (GAUZE/BANDAGES/DRESSINGS) ×1 IMPLANT
DRSG AQUACEL AG ADV 3.5X10 (GAUZE/BANDAGES/DRESSINGS) IMPLANT
ELECTRODE BLDE 4.0 EZ CLN MEGD (MISCELLANEOUS) IMPLANT
ELECTRODE REM PT RTRN 9FT ADLT (ELECTROSURGICAL) ×1 IMPLANT
GAUZE PAD ABD 8X10 STRL (GAUZE/BANDAGES/DRESSINGS) IMPLANT
GAUZE XEROFORM 1X8 LF (GAUZE/BANDAGES/DRESSINGS) IMPLANT
GLOVE BIO SURGEON STRL SZ7.5 (GLOVE) ×2 IMPLANT
GLOVE BIOGEL PI IND STRL 7.0 (GLOVE) ×1 IMPLANT
GLOVE BIOGEL PI IND STRL 8 (GLOVE) ×1 IMPLANT
GLOVE SKINSENSE STRL SZ6.5 (GLOVE) ×1 IMPLANT
GOWN STRL REUS W/ TWL LRG LVL3 (GOWN DISPOSABLE) ×2 IMPLANT
GOWN STRL REUS W/TWL XL LVL3 (GOWN DISPOSABLE) ×1 IMPLANT
MANIFOLD NEPTUNE II (INSTRUMENTS) ×1 IMPLANT
NDL 1/2 CIR CATGUT .05X1.09 (NEEDLE) IMPLANT
NDL HYPO 22X1.5 SAFETY MO (MISCELLANEOUS) ×1 IMPLANT
NEEDLE 1/2 CIR CATGUT .05X1.09 (NEEDLE) ×1 IMPLANT
NEEDLE HYPO 22X1.5 SAFETY MO (MISCELLANEOUS) ×1 IMPLANT
NS IRRIG 1000ML POUR BTL (IV SOLUTION) ×1 IMPLANT
PACK BASIN DAY SURGERY FS (CUSTOM PROCEDURE TRAY) ×1 IMPLANT
PENCIL SMOKE EVACUATOR (MISCELLANEOUS) ×1 IMPLANT
SLEEVE SCD COMPRESS KNEE MED (STOCKING) ×1 IMPLANT
SPIKE FLUID TRANSFER (MISCELLANEOUS) IMPLANT
SPONGE T-LAP 18X18 ~~LOC~~+RFID (SPONGE) ×1 IMPLANT
SUCTION TUBE FRAZIER 10FR DISP (SUCTIONS) ×1 IMPLANT
SUT ETHILON 3 0 PS 1 (SUTURE) IMPLANT
SUT MNCRL AB 3-0 PS2 27 (SUTURE) IMPLANT
SUT MNCRL AB 4-0 PS2 18 (SUTURE) IMPLANT
SUT VIC AB 0 CT1 27XBRD ANBCTR (SUTURE) IMPLANT
SUT VIC AB 2-0 CT1 TAPERPNT 27 (SUTURE) IMPLANT
SYR 20ML LL LF (SYRINGE) ×1 IMPLANT
SYR BULB EAR ULCER 3OZ GRN STR (SYRINGE) ×1 IMPLANT
TOWEL GREEN STERILE FF (TOWEL DISPOSABLE) ×2 IMPLANT
TUBE CONNECTING 20X1/4 (TUBING) ×1 IMPLANT
UNDERPAD 30X36 HEAVY ABSORB (UNDERPADS AND DIAPERS) IMPLANT
YANKAUER SUCT BULB TIP NO VENT (SUCTIONS) ×1 IMPLANT

## 2024-05-30 NOTE — Anesthesia Procedure Notes (Signed)
 Procedure Name: Intubation Date/Time: 05/30/2024 7:42 AM  Performed by: Burnard Rosaline HERO, CRNAPre-anesthesia Checklist: Patient identified, Emergency Drugs available, Suction available and Patient being monitored Patient Re-evaluated:Patient Re-evaluated prior to induction Oxygen  Delivery Method: Circle system utilized Preoxygenation: Pre-oxygenation with 100% oxygen  Induction Type: IV induction Ventilation: Mask ventilation without difficulty Laryngoscope Size: Mac and 4 Grade View: Grade I Tube type: Oral Tube size: 7.0 mm Number of attempts: 1 Airway Equipment and Method: Stylet and Oral airway Placement Confirmation: ETT inserted through vocal cords under direct vision, positive ETCO2, breath sounds checked- equal and bilateral and CO2 detector Secured at: 22 cm Tube secured with: Tape Dental Injury: Teeth and Oropharynx as per pre-operative assessment

## 2024-05-30 NOTE — Anesthesia Preprocedure Evaluation (Signed)
 Anesthesia Evaluation  Patient identified by MRN, date of birth, ID band Patient awake    Reviewed: Allergy & Precautions, NPO status , Patient's Chart, lab work & pertinent test results  Airway Mallampati: II  TM Distance: <3 FB Neck ROM: Full    Dental  (+) Teeth Intact, Dental Advisory Given   Pulmonary    Pulmonary exam normal breath sounds clear to auscultation       Cardiovascular hypertension, Normal cardiovascular exam Rhythm:Regular Rate:Normal     Neuro/Psych  Headaches PSYCHIATRIC DISORDERS Anxiety     CVA, No Residual Symptoms    GI/Hepatic ,GERD  Medicated and Controlled,,  Endo/Other  Hypothyroidism    Renal/GU      Musculoskeletal  (+) Arthritis ,  Fibromyalgia -  Abdominal   Peds  Hematology   Anesthesia Other Findings   Reproductive/Obstetrics                              Anesthesia Physical Anesthesia Plan  ASA: 3  Anesthesia Plan: General   Post-op Pain Management: Tylenol  PO (pre-op)* and Toradol  IV (intra-op)*   Induction: Intravenous  PONV Risk Score and Plan: 3 and Dexamethasone  and Ondansetron   Airway Management Planned: Oral ETT  Additional Equipment:   Intra-op Plan:   Post-operative Plan: Extubation in OR  Informed Consent: I have reviewed the patients History and Physical, chart, labs and discussed the procedure including the risks, benefits and alternatives for the proposed anesthesia with the patient or authorized representative who has indicated his/her understanding and acceptance.     Dental advisory given  Plan Discussed with: CRNA  Anesthesia Plan Comments:         Anesthesia Quick Evaluation

## 2024-05-30 NOTE — Discharge Instructions (Signed)

## 2024-05-30 NOTE — Op Note (Signed)
 05/30/2024  8:18 AM  PATIENT:  Sarah [name]    PRE-OPERATIVE DIAGNOSIS:  RIGHT HIP TROCHANTERIC BURSITIS, GLUTEUS MINIMUS TENDON TEAR  POST-OPERATIVE DIAGNOSIS:  Same  PROCEDURE:  REPAIR, TENDON, GLUTEUS MINIMUS; RIGHT HIP TROCHANTERIC BURSECTOMY  SURGEON:  Evalene JONETTA Chancy, MD  ASSISTANT: Gerard Large, PA-C, he was present and scrubbed throughout the case, critical for completion in a timely fashion, and for retraction, instrumentation, and closure.   ANESTHESIA:   General  PREOPERATIVE INDICATIONS:  Sarah [name] is a  73 y.o. female with a diagnosis of RIGHT HIP TROCHANTERIC BURSITIS, GLUTEUS MINIMUS TENDON TEAR who failed conservative measures and elected for surgical management.    The risks benefits and alternatives were discussed with the patient preoperatively including but not limited to the risks of infection, bleeding, nerve injury, cardiopulmonary complications, the need for revision surgery, among others, and the patient was willing to proceed.  OPERATIVE IMPLANTS: Arthrex swivel lock anchor  OPERATIVE FINDINGS: Defect in the minimus medius tendon junction  BLOOD LOSS: Minimal  COMPLICATIONS: None  TOURNIQUET TIME: None  OPERATIVE PROCEDURE:  Patient was identified in the preoperative holding area and site was marked by me She was transported to the operating theater and placed on the table in supine position taking care to pad all bony prominences. After a preincinduction time out anesthesia was induced. The right lower extremity was prepped and draped in normal sterile fashion and a pre-incision timeout was performed. She received Ancef  for preoperative antibiotics.   She was placed in the lateral position on an axillary roll padding all bony prominences  The right flank was prepped and draped incision was made over the greater trochanter and dissection carried down sharply to the IT band this was then incised longitudinally I immediately noted a defect at the  insertion of the minimus and medius junction portion of the abductor tendons with a fluid collection.    Next I performed a bursectomy using a rondure  Tenolysis was performed of the Clute minimus and medius tendons identifying this defect and freshening it out for healing  Whipstitch was placed within the tendon  Swivel lock anchor was then used to secure this stitch to the greater trochanter I then performed a side-to-side repair of the tendon defect  Thorough irrigation was performed vancomycin  powder placed layered closure was then performed  POST OPERATIVE PLAN: Toe-touch weightbearing chemical DVT prophylaxis

## 2024-05-30 NOTE — Interval H&P Note (Signed)
 History and Physical Interval Note:  05/30/2024 6:55 AM  Sarah [name]  has presented today for surgery, with the diagnosis of RIGHT HIP TROCHANTERIC BURSITIS, GLUTEUS MINIMUS TENDON TEAR.  The various methods of treatment have been discussed with the patient and family. After consideration of risks, benefits and other options for treatment, the patient has consented to  Procedure(s): REPAIR, TENDON, GLUTEUS MINIMUS (Right) as a surgical intervention.  The patient's history has been reviewed, patient examined, no change in status, stable for surgery.  I have reviewed the patient's chart and labs.  Questions were answered to the patient's satisfaction.     Sarah [name]

## 2024-05-30 NOTE — Transfer of Care (Signed)
 Immediate Anesthesia Transfer of Care Note  Patient: Sarah [name]  Procedure(s) Performed: REPAIR, TENDON, GLUTEUS MINIMUS; RIGHT HIP TROCHANTERIC BURSECTOMY (Right: Hip)  Patient Location: PACU  Anesthesia Type:General  Level of Consciousness: awake, alert , and oriented  Airway & Oxygen  Therapy: Patient Spontanous Breathing and Patient connected to face mask oxygen   Post-op Assessment: Report given to RN and Post -op Vital signs reviewed and stable  Post vital signs: Reviewed and stable  Last Vitals:  Vitals Value Taken Time  BP 125/69 05/30/24 08:42  Temp    Pulse 89 05/30/24 08:44  Resp 23 05/30/24 08:44  SpO2 98 % 05/30/24 08:44  Vitals shown include unfiled device data.  Last Pain:  Vitals:   05/30/24 0647  TempSrc:   PainSc: 8       Patients Stated Pain Goal: 7 (05/30/24 9356)  Complications: No notable events documented.

## 2024-05-30 NOTE — Anesthesia Postprocedure Evaluation (Signed)
 Anesthesia Post Note  Patient: Sarah [name]  Procedure(s) Performed: REPAIR, TENDON, GLUTEUS MINIMUS; RIGHT HIP TROCHANTERIC BURSECTOMY (Right: Hip)     Patient location during evaluation: PACU Anesthesia Type: General Level of consciousness: awake and alert Pain management: pain level controlled Vital Signs Assessment: post-procedure vital signs reviewed and stable Respiratory status: spontaneous breathing, nonlabored ventilation and respiratory function stable Cardiovascular status: blood pressure returned to baseline and stable Postop Assessment: no apparent nausea or vomiting Anesthetic complications: no   No notable events documented.  Last Vitals:  Vitals:   05/30/24 0900 05/30/24 0931  BP: (!) 110/50 (!) 143/86  Pulse: 76 72  Resp: 12 16  Temp: (!) 36.3 C (!) 36.3 C  SpO2: 97% 96%    Last Pain:  Vitals:   05/30/24 0931  TempSrc: Temporal  PainSc: 6                  Garnette FORBES Skillern

## 2024-05-31 ENCOUNTER — Encounter (HOSPITAL_BASED_OUTPATIENT_CLINIC_OR_DEPARTMENT_OTHER): Payer: Self-pay | Admitting: Orthopedic Surgery

## 2024-06-13 ENCOUNTER — Telehealth: Payer: Self-pay | Admitting: *Deleted

## 2024-06-13 ENCOUNTER — Encounter (HOSPITAL_COMMUNITY): Payer: Self-pay

## 2024-06-13 ENCOUNTER — Other Ambulatory Visit: Payer: Self-pay

## 2024-06-13 ENCOUNTER — Inpatient Hospital Stay (HOSPITAL_COMMUNITY)
Admission: EM | Admit: 2024-06-13 | Discharge: 2024-06-16 | DRG: 378 | Disposition: A | Discharge status: Home / Self Care | Attending: Internal Medicine | Admitting: Internal Medicine

## 2024-06-13 ENCOUNTER — Encounter: Payer: Self-pay | Admitting: Oncology

## 2024-06-13 DIAGNOSIS — F419 Anxiety disorder, unspecified: Secondary | ICD-10-CM | POA: Diagnosis not present

## 2024-06-13 DIAGNOSIS — Z791 Long term (current) use of non-steroidal anti-inflammatories (NSAID): Secondary | ICD-10-CM

## 2024-06-13 DIAGNOSIS — K5730 Diverticulosis of large intestine without perforation or abscess without bleeding: Secondary | ICD-10-CM | POA: Diagnosis not present

## 2024-06-13 DIAGNOSIS — K224 Dyskinesia of esophagus: Secondary | ICD-10-CM | POA: Diagnosis not present

## 2024-06-13 DIAGNOSIS — Z96641 Presence of right artificial hip joint: Secondary | ICD-10-CM | POA: Diagnosis present

## 2024-06-13 DIAGNOSIS — Z66 Do not resuscitate: Secondary | ICD-10-CM | POA: Diagnosis present

## 2024-06-13 DIAGNOSIS — Z79899 Other long term (current) drug therapy: Secondary | ICD-10-CM

## 2024-06-13 DIAGNOSIS — K219 Gastro-esophageal reflux disease without esophagitis: Secondary | ICD-10-CM | POA: Diagnosis present

## 2024-06-13 DIAGNOSIS — M797 Fibromyalgia: Secondary | ICD-10-CM | POA: Diagnosis not present

## 2024-06-13 DIAGNOSIS — K589 Irritable bowel syndrome without diarrhea: Secondary | ICD-10-CM | POA: Diagnosis not present

## 2024-06-13 DIAGNOSIS — Z79890 Hormone replacement therapy (postmenopausal): Secondary | ICD-10-CM

## 2024-06-13 DIAGNOSIS — K449 Diaphragmatic hernia without obstruction or gangrene: Secondary | ICD-10-CM | POA: Diagnosis not present

## 2024-06-13 DIAGNOSIS — Z604 Social exclusion and rejection: Secondary | ICD-10-CM | POA: Diagnosis not present

## 2024-06-13 DIAGNOSIS — I5032 Chronic diastolic (congestive) heart failure: Secondary | ICD-10-CM | POA: Diagnosis present

## 2024-06-13 DIAGNOSIS — E785 Hyperlipidemia, unspecified: Secondary | ICD-10-CM | POA: Diagnosis not present

## 2024-06-13 DIAGNOSIS — Z833 Family history of diabetes mellitus: Secondary | ICD-10-CM | POA: Diagnosis not present

## 2024-06-13 DIAGNOSIS — R1030 Lower abdominal pain, unspecified: Secondary | ICD-10-CM | POA: Diagnosis not present

## 2024-06-13 DIAGNOSIS — G894 Chronic pain syndrome: Secondary | ICD-10-CM | POA: Diagnosis present

## 2024-06-13 DIAGNOSIS — G4700 Insomnia, unspecified: Secondary | ICD-10-CM | POA: Diagnosis not present

## 2024-06-13 DIAGNOSIS — Z860100 Personal history of colon polyps, unspecified: Secondary | ICD-10-CM

## 2024-06-13 DIAGNOSIS — K625 Hemorrhage of anus and rectum: Principal | ICD-10-CM

## 2024-06-13 DIAGNOSIS — N281 Cyst of kidney, acquired: Secondary | ICD-10-CM | POA: Diagnosis not present

## 2024-06-13 DIAGNOSIS — Z90710 Acquired absence of both cervix and uterus: Secondary | ICD-10-CM | POA: Diagnosis not present

## 2024-06-13 DIAGNOSIS — Z8249 Family history of ischemic heart disease and other diseases of the circulatory system: Secondary | ICD-10-CM

## 2024-06-13 DIAGNOSIS — I701 Atherosclerosis of renal artery: Secondary | ICD-10-CM | POA: Diagnosis not present

## 2024-06-13 DIAGNOSIS — E039 Hypothyroidism, unspecified: Secondary | ICD-10-CM | POA: Diagnosis not present

## 2024-06-13 DIAGNOSIS — Z8673 Personal history of transient ischemic attack (TIA), and cerebral infarction without residual deficits: Secondary | ICD-10-CM

## 2024-06-13 DIAGNOSIS — K5791 Diverticulosis of intestine, part unspecified, without perforation or abscess with bleeding: Secondary | ICD-10-CM | POA: Diagnosis not present

## 2024-06-13 DIAGNOSIS — I110 Hypertensive heart disease with heart failure: Secondary | ICD-10-CM | POA: Diagnosis not present

## 2024-06-13 DIAGNOSIS — D62 Acute posthemorrhagic anemia: Secondary | ICD-10-CM | POA: Diagnosis not present

## 2024-06-13 DIAGNOSIS — D509 Iron deficiency anemia, unspecified: Secondary | ICD-10-CM | POA: Diagnosis not present

## 2024-06-13 DIAGNOSIS — K922 Gastrointestinal hemorrhage, unspecified: Secondary | ICD-10-CM | POA: Diagnosis present

## 2024-06-13 LAB — COMPREHENSIVE METABOLIC PANEL WITH GFR
ALT: 33 U/L (ref 0–44)
AST: 33 U/L (ref 15–41)
Albumin: 4 g/dL (ref 3.5–5.0)
Alkaline Phosphatase: 106 U/L (ref 38–126)
Anion gap: 15 (ref 5–15)
BUN: 22 mg/dL (ref 8–23)
CO2: 20 mmol/L — ABNORMAL LOW (ref 22–32)
Calcium: 9.8 mg/dL (ref 8.9–10.3)
Chloride: 103 mmol/L (ref 98–111)
Creatinine, Ser: 0.77 mg/dL (ref 0.44–1.00)
GFR, Estimated: >60 mL/min (ref >60)
Glucose, Bld: 109 mg/dL — ABNORMAL HIGH (ref 70–99)
Potassium: 3.7 mmol/L (ref 3.5–5.1)
Sodium: 138 mmol/L (ref 135–145)
Total Bilirubin: 0.3 mg/dL (ref 0.0–1.2)
Total Protein: 6.6 g/dL (ref 6.5–8.1)

## 2024-06-13 LAB — POC OCCULT BLOOD, ED: Fecal Occult Bld: POSITIVE — AB

## 2024-06-13 LAB — CBC
HCT: 34.9 % — ABNORMAL LOW (ref 36.0–46.0)
Hemoglobin: 11.9 g/dL — ABNORMAL LOW (ref 12.0–15.0)
MCH: 30.8 pg (ref 26.0–34.0)
MCHC: 34.1 g/dL (ref 30.0–36.0)
MCV: 90.4 fL (ref 80.0–100.0)
Platelets: 299 K/uL (ref 150–400)
RBC: 3.86 MIL/uL — ABNORMAL LOW (ref 3.87–5.11)
RDW: 12.8 % (ref 11.5–15.5)
WBC: 15.2 K/uL — ABNORMAL HIGH (ref 4.0–10.5)
nRBC: 0 % (ref 0.0–0.2)

## 2024-06-13 LAB — TYPE AND SCREEN
ABO/RH(D): A POS
Antibody Screen: NEGATIVE

## 2024-06-13 MED ORDER — LEVOTHYROXINE SODIUM 75 MCG PO TABS
75.0000 ug | ORAL_TABLET | Freq: Every day | ORAL | Status: DC
  Administered 2024-06-15 – 2024-06-16 (×2): 75 ug via ORAL
  Filled 2024-06-13 (×3): qty 1

## 2024-06-13 MED ORDER — FUROSEMIDE 40 MG PO TABS: 40.0000 mg | ORAL_TABLET | Freq: Every morning | ORAL | Status: DC

## 2024-06-13 MED ORDER — DIPHENHYDRAMINE HCL 25 MG PO CAPS
50.0000 mg | ORAL_CAPSULE | Freq: Every day | ORAL | Status: DC
  Administered 2024-06-13 – 2024-06-15 (×3): 50 mg via ORAL
  Filled 2024-06-13 (×3): qty 2

## 2024-06-13 MED ORDER — PANTOPRAZOLE SODIUM 40 MG PO TBEC
40.0000 mg | DELAYED_RELEASE_TABLET | Freq: Every day | ORAL | Status: DC
  Administered 2024-06-14 – 2024-06-16 (×3): 40 mg via ORAL
  Filled 2024-06-13 (×3): qty 1

## 2024-06-13 MED ORDER — BISACODYL 5 MG PO TBEC: 5.0000 mg | DELAYED_RELEASE_TABLET | Freq: Every day | ORAL | Status: DC | PRN

## 2024-06-13 MED ORDER — ALPRAZOLAM 0.5 MG PO TABS
0.5000 mg | ORAL_TABLET | Freq: Every day | ORAL | Status: DC
  Administered 2024-06-13 – 2024-06-15 (×3): 0.5 mg via ORAL
  Filled 2024-06-13 (×3): qty 1

## 2024-06-13 MED ORDER — ONDANSETRON HCL 4 MG/2ML IJ SOLN: 4.0000 mg | Freq: Four times a day (QID) | INTRAMUSCULAR | Status: DC | PRN

## 2024-06-13 MED ORDER — ACETAMINOPHEN 650 MG RE SUPP: 650.0000 mg | Freq: Four times a day (QID) | RECTAL | Status: DC | PRN

## 2024-06-13 MED ORDER — PHENTERMINE HCL 37.5 MG PO TABS: 18.7500 mg | ORAL_TABLET | Freq: Every morning | ORAL | Status: DC

## 2024-06-13 MED ORDER — SENNOSIDES-DOCUSATE SODIUM 8.6-50 MG PO TABS: 1.0000 | ORAL_TABLET | Freq: Every evening | ORAL | Status: DC | PRN

## 2024-06-13 MED ORDER — LINACLOTIDE 145 MCG PO CAPS
290.0000 ug | ORAL_CAPSULE | Freq: Every morning | ORAL | Status: DC
  Administered 2024-06-14: 290 ug via ORAL
  Filled 2024-06-13: qty 2

## 2024-06-13 MED ORDER — ATORVASTATIN CALCIUM 10 MG PO TABS
20.0000 mg | ORAL_TABLET | Freq: Every day | ORAL | Status: DC
Start: 2024-06-14 — End: 2024-06-16
  Administered 2024-06-14 – 2024-06-16 (×3): 20 mg via ORAL
  Filled 2024-06-13 (×3): qty 2

## 2024-06-13 MED ORDER — ONDANSETRON HCL 4 MG PO TABS: 4.0000 mg | ORAL_TABLET | Freq: Four times a day (QID) | ORAL | Status: DC | PRN

## 2024-06-13 MED ORDER — ACETAMINOPHEN 325 MG PO TABS
650.0000 mg | ORAL_TABLET | Freq: Four times a day (QID) | ORAL | Status: DC | PRN
  Administered 2024-06-13 – 2024-06-16 (×3): 650 mg via ORAL
  Filled 2024-06-13 (×3): qty 2

## 2024-06-13 NOTE — ED Provider Triage Note (Signed)
 Emergency Medicine Provider Triage Evaluation Note  KHYLEE ALGEO , a 73 y.o. female  was evaluated in triage.  Pt complains of bleeding from rectum. Noticed around 1100 today. Has had similar episode in past. Followed by GI. Last colonoscopy did not reveal source of bleeding  Review of Systems  Positive: Weakness, lower abdominal pain, rectal bleeding Negative: Fever, chills, nausea, vomting  Physical Exam  BP (!) 114/90 (BP Location: Right Arm)   Pulse (!) 112   Temp 97.9 F (36.6 C) (Oral)   Resp 14   SpO2 100%  Gen:   Awake, no distress   Resp:  Normal effort  MSK:   Moves extremities without difficulty  Other:  Abdomen soft  Medical Decision Making  Medically screening exam initiated at 3:14 PM.  Appropriate orders placed.  LAQUENTA WHITSELL was informed that the remainder of the evaluation will be completed by another provider, this initial triage assessment does not replace that evaluation, and the importance of remaining in the ED until their evaluation is complete.     Claudene Lenis, NP 06/13/24 [phone]

## 2024-06-13 NOTE — ED Notes (Addendum)
 Pt reports stool incontinence, bloody stool noted. Pt was cleansed, chux pad changed. PT tolerated well, and denies any further needs.

## 2024-06-13 NOTE — Telephone Encounter (Signed)
 Patient called stating that she began having large amounts of frank blood from rectum over the last hour.  Has history of GI bleed.  Advised to report to ER immediately.  Verbalized understanding.

## 2024-06-13 NOTE — ED Triage Notes (Signed)
 Pt reports with rectal bleeding since 1130 this morning. Pt reports having polyps. Bleeding is bright red and dark red.

## 2024-06-13 NOTE — ED Provider Notes (Signed)
  EMERGENCY DEPARTMENT AT [facility] Provider Note   CSN: [id number] Arrival date & time: 06/13/24  1456     Patient presents with: Rectal Bleeding   Sarah [name] is a 73 y.o. female.    Rectal Bleeding    Patient has a history of fibromyalgia arthritis reflux acid reflux hypertension GI bleeding.  Patient presents to the ED for evaluation of rectal bleeding.  Patient states she started having symptoms this morning.  Patient states since 66 AM she has had multiple episodes of rectal bleeding.  Patient states she is having some mild abdominal cramping but not any significant pain.  She has not had any nausea or vomiting.  Patient is feeling lightheaded.  She spoke with her GI doctor and hematologist and was instructed to come to the ED Prior to Admission medications   Medication Sig Start Date End Date Taking? Authorizing Provider  acetaminophen  (TYLENOL ) 650 MG CR tablet Take 1,300 mg by mouth every 8 (eight) hours as needed (pain.).    [provider]  ALPRAZolam  (XANAX ) 0.5 MG tablet Take 0.5 mg by mouth at bedtime. 03/15/20   [provider]  aspirin  EC 81 MG tablet Take 1 tablet (81 mg total) by mouth 2 (two) times daily. To prevent blood clots for 30 days after surgery. 05/30/24   Gawne, Meghan M, PA-C  atorvastatin  (LIPITOR) 20 MG tablet Take 1 tablet (20 mg total) by mouth daily. 10/03/23 10/02/24  Cheryle Page, MD  baclofen  (LIORESAL ) 10 MG tablet Take 1 tablet (10 mg total) by mouth every 8 (eight) hours as needed for muscle spasms. 05/30/24 05/30/25  Gawne, Meghan M, PA-C  esomeprazole (NEXIUM) 20 MG capsule Take 20 mg by mouth daily before breakfast.    [provider]  furosemide  (LASIX ) 40 MG tablet Take 40 mg by mouth 2 (two) times daily. 08/05/23   [provider]  HYDROcodone -acetaminophen  (NORCO) 10-325 MG tablet Take 1 tablet by mouth every 6 (six) hours as needed for severe pain (pain score 7-10). in hip after  surgery 05/30/24   Ted Gerard HERO, PA-C  LINZESS  290 MCG CAPS capsule Take 290 mcg by mouth every morning. 12/07/23   [provider]  meloxicam  (MOBIC ) 15 MG tablet Take 1 tablet (15 mg total) by mouth daily as needed for pain (and inflammation). 05/30/24   Gawne, Meghan M, PA-C  ondansetron  (ZOFRAN -ODT) 4 MG disintegrating tablet Take 1 tablet (4 mg total) by mouth every 8 (eight) hours as needed for nausea or vomiting. 05/30/24   Gawne, Meghan M, PA-C  phentermine  (ADIPEX-P ) 37.5 MG tablet Take 18.75 mg by mouth every morning. 02/13/23   [provider]  potassium chloride  SA (KLOR-CON  M) 20 MEQ tablet Take 20 mEq by mouth 2 (two) times daily. 10/11/23   [provider]  SYNTHROID  75 MCG tablet Take 75 mcg by mouth daily. 10/11/23   [provider]  traMADol  (ULTRAM ) 50 MG tablet Take 50 mg by mouth every 4 (four) hours as needed (pain.). 03/01/23   [provider]    Allergies: Penicillins    Review of Systems  Gastrointestinal:  Positive for hematochezia.    Updated Vital Signs BP 127/71 (BP Location: Left Arm)   Pulse 93   Temp 97.9 F (36.6 C) (Oral)   Resp 19   SpO2 100%   Physical Exam Vitals and nursing note reviewed.  Constitutional:      Appearance: She is well-developed. She is not diaphoretic.  HENT:  Head: Normocephalic and atraumatic.     Right Ear: External ear normal.     Left Ear: External ear normal.  Eyes:     General: No scleral icterus.       Right eye: No discharge.        Left eye: No discharge.     Conjunctiva/sclera: Conjunctivae normal.  Neck:     Trachea: No tracheal deviation.  Cardiovascular:     Rate and Rhythm: Normal rate and regular rhythm.  Pulmonary:     Effort: Pulmonary effort is normal. No respiratory distress.     Breath sounds: Normal breath sounds. No stridor. No wheezing or rales.  Abdominal:     General: Bowel sounds are normal. There is no distension.     Palpations: Abdomen is soft.      Tenderness: There is no abdominal tenderness. There is no guarding or rebound.  Genitourinary:    Comments: No hemorrhoidal bleeding noted, maroon blood noted on rectal exam Musculoskeletal:        General: No tenderness or deformity.     Cervical back: Neck supple.  Skin:    General: Skin is warm and dry.     Findings: No rash.  Neurological:     General: No focal deficit present.     Mental Status: She is alert.     Cranial Nerves: No cranial nerve deficit, dysarthria or facial asymmetry.     Sensory: No sensory deficit.     Motor: No abnormal muscle tone or seizure activity.     Coordination: Coordination normal.  Psychiatric:        Mood and Affect: Mood normal.     (all labs ordered are listed, but only abnormal results are displayed) Labs Reviewed  COMPREHENSIVE METABOLIC PANEL WITH GFR - Abnormal; Notable for the following components:      Result Value   CO2 20 (*)    Glucose, Bld 109 (*)    All other components within normal limits  CBC - Abnormal; Notable for the following components:   WBC 15.2 (*)    RBC 3.86 (*)    Hemoglobin 11.9 (*)    HCT 34.9 (*)    All other components within normal limits  POC OCCULT BLOOD, ED - Abnormal; Notable for the following components:   Fecal Occult Bld POSITIVE (*)    All other components within normal limits  TYPE AND SCREEN    EKG: None  Radiology: No results found.   Procedures   Medications Ordered in the ED - No data to display  Clinical Course as of 06/13/24 1812  Tue Jun 13, 2024  1654 CBC(!) White blood cell count elevated, hemoglobin decreased compared to previous.  Metabolic panel unremarkable [JK]  1812 Case discussed with Dr Lou [JK]    Clinical Course User Index [JK] Randol Simmonds, MD                                 Medical Decision Making Problems Addressed: Rectal bleeding: acute illness or injury that poses a threat to life or bodily functions  Amount and/or Complexity of Data  Reviewed Labs: ordered. Decision-making details documented in ED Course.  Risk Decision regarding hospitalization.   Patient presented to the ED for evaluation of rectal bleeding.  Patient does have history of GI bleeding.  In the ED patient noted to have obvious blood on rectal exam but she is not having active  bleeding at the moment.  Hemoglobin is stable and no indication for transfusion at this time.  However with her history of GI bleeding and the degree of bleeding will consult the medical service for admission and medical consult working with GI     Final diagnoses:  Rectal bleeding    ED Discharge Orders     None          Randol Simmonds, MD 06/13/24 [phone]
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
## 2024-06-14 ENCOUNTER — Inpatient Hospital Stay (HOSPITAL_COMMUNITY)

## 2024-06-14 DIAGNOSIS — D62 Acute posthemorrhagic anemia: Secondary | ICD-10-CM

## 2024-06-14 DIAGNOSIS — I5032 Chronic diastolic (congestive) heart failure: Secondary | ICD-10-CM | POA: Diagnosis not present

## 2024-06-14 DIAGNOSIS — R1030 Lower abdominal pain, unspecified: Secondary | ICD-10-CM | POA: Diagnosis not present

## 2024-06-14 LAB — FERRITIN: Ferritin: 236 ng/mL (ref 11–307)

## 2024-06-14 LAB — HEMOGLOBIN AND HEMATOCRIT, BLOOD
HCT: 27 % — ABNORMAL LOW (ref 36.0–46.0)
HCT: 28.7 % — ABNORMAL LOW (ref 36.0–46.0)
HCT: 25 % — ABNORMAL LOW (ref 36.0–46.0)
Hemoglobin: 8.9 g/dL — ABNORMAL LOW (ref 12.0–15.0)
Hemoglobin: 9.7 g/dL — ABNORMAL LOW (ref 12.0–15.0)
Hemoglobin: 8.2 g/dL — ABNORMAL LOW (ref 12.0–15.0)

## 2024-06-14 LAB — IRON AND TIBC
Iron: 125 ug/dL (ref 28–170)
Saturation Ratios: 52 % — ABNORMAL HIGH (ref 10.4–31.8)
TIBC: 241 ug/dL — ABNORMAL LOW (ref 250–450)
UIBC: 116 ug/dL

## 2024-06-14 LAB — CBC
HCT: 26.7 % — ABNORMAL LOW (ref 36.0–46.0)
Hemoglobin: 9.1 g/dL — ABNORMAL LOW (ref 12.0–15.0)
MCH: 31.2 pg (ref 26.0–34.0)
MCHC: 34.1 g/dL (ref 30.0–36.0)
MCV: 91.4 fL (ref 80.0–100.0)
Platelets: 204 K/uL (ref 150–400)
RBC: 2.92 MIL/uL — ABNORMAL LOW (ref 3.87–5.11)
RDW: 13.1 % (ref 11.5–15.5)
WBC: 8.6 K/uL (ref 4.0–10.5)
nRBC: 0 % (ref 0.0–0.2)

## 2024-06-14 LAB — BASIC METABOLIC PANEL WITH GFR
Anion gap: 10 (ref 5–15)
BUN: 20 mg/dL (ref 8–23)
CO2: 24 mmol/L (ref 22–32)
Calcium: 8.8 mg/dL — ABNORMAL LOW (ref 8.9–10.3)
Chloride: 107 mmol/L (ref 98–111)
Creatinine, Ser: 0.61 mg/dL (ref 0.44–1.00)
GFR, Estimated: >60 mL/min (ref >60)
Glucose, Bld: 117 mg/dL — ABNORMAL HIGH (ref 70–99)
Potassium: 3.9 mmol/L (ref 3.5–5.1)
Sodium: 141 mmol/L (ref 135–145)

## 2024-06-14 LAB — VITAMIN B12: Vitamin B-12: 432 pg/mL (ref 180–914)

## 2024-06-14 MED ORDER — IOHEXOL 350 MG/ML SOLN
100.0000 mL | Freq: Once | INTRAVENOUS | Status: AC | PRN
  Administered 2024-06-14: 100 mL via INTRAVENOUS

## 2024-06-14 MED ORDER — FENTANYL CITRATE PF 50 MCG/ML IJ SOSY
12.5000 ug | PREFILLED_SYRINGE | Freq: Once | INTRAMUSCULAR | Status: AC
  Administered 2024-06-14: 12.5 ug via INTRAVENOUS
  Filled 2024-06-14: qty 1

## 2024-06-14 MED ORDER — SODIUM CHLORIDE 0.9 % IV BOLUS
500.0000 mL | Freq: Once | INTRAVENOUS | Status: AC
  Administered 2024-06-14: 500 mL via INTRAVENOUS

## 2024-06-14 MED ORDER — SODIUM CHLORIDE 0.9 % IV SOLN: INTRAVENOUS | Status: DC

## 2024-06-14 MED ORDER — HYDROMORPHONE HCL 1 MG/ML IJ SOLN
0.5000 mg | INTRAMUSCULAR | Status: DC | PRN
Start: 2024-06-14 — End: 2024-06-16
  Administered 2024-06-14 – 2024-06-16 (×7): 0.5 mg via INTRAVENOUS
  Filled 2024-06-14 (×7): qty 0.5

## 2024-06-14 MED ORDER — OXYCODONE HCL 5 MG PO TABS
5.0000 mg | ORAL_TABLET | Freq: Four times a day (QID) | ORAL | Status: DC | PRN
  Administered 2024-06-14 – 2024-06-15 (×2): 5 mg via ORAL
  Filled 2024-06-14 (×2): qty 1

## 2024-06-14 NOTE — TOC Initial Note (Signed)
 Transition of Care [facility]) - Initial/Assessment Note    Patient Details  Name: Sarah [name] MRN: [id number] Date of Birth: 24-Dec-1950  Transition of [facility] CM/SW Contact:    Doneta Glenys DASEN, RN Phone Number: 06/14/2024, 4:20 PM  Clinical Narrative:                 Presented for rectal bleeding. CM introduced and explained role. Patient states PTA lives in a house alone;PCP/insurance verified;DME-cane, rollator;denies HH,oxygen  or SDOH needs; sonSouthern,Jamie ([phone]  will transport home. No IP CM needs identified during visit. Place consult is needs present.  Expected Discharge Plan: Home/Self Care Barriers to Discharge: No Barriers Identified, Continued Medical Work up   Patient Goals and CMS Choice Patient states their goals for this hospitalization and ongoing recovery are:: home CMS Medicare.gov Compare Post Acute Care list provided to::  (NA) Choice offered to / list presented to : NA [place] ownership interest in [facility].provided to:: Parent NA    Expected Discharge Plan and Services In-house Referral: NA Discharge Planning Services: CM Consult Post Acute Care Choice: NA Living arrangements for the past 2 months: Single Family Home                 DME Arranged: N/A DME Agency: NA       HH Arranged: NA HH Agency: NA        Prior Living Arrangements/Services Living arrangements for the past 2 months: Single Family Home Lives with:: Self Patient language and need for interpreter reviewed:: Yes Do you feel safe going back to the place where you live?: Yes      Need for Family Participation in Patient Care: No (Comment) Care giver support system in place?: Yes (comment) Current home services: DME (cane, rollator) Criminal Activity/Legal Involvement Pertinent to Current Situation/Hospitalization: No - Comment as needed  Activities of Daily Living   ADL Screening (condition at time of admission) Independently performs ADLs?: Yes  (appropriate for developmental age) Is the patient deaf or have difficulty hearing?: No Does the patient have difficulty seeing, even when wearing glasses/contacts?: No Does the patient have difficulty concentrating, remembering, or making decisions?: No  Permission Sought/Granted Permission sought to share information with : Case Manager Permission granted to share information with : Yes, Verbal Permission Granted  Share Information with NAME: Randine Pierce (Son)  [phone]           Emotional Assessment Appearance:: Appears stated age Attitude/Demeanor/Rapport: Engaged Affect (typically observed): Appropriate Orientation: : Oriented to Self, Oriented to Place, Oriented to  Time, Oriented to Situation Alcohol / Substance Use: Not Applicable Psych Involvement: No (comment)  Admission diagnosis:  Rectal bleeding [K62.5] GI bleed [K92.2] Patient Active Problem List   Diagnosis Date Noted   Chronic diastolic HF (heart failure) (HCC) 06/13/2024   Rectal bleeding 06/13/2024   Anemia 10/14/2023   Hypokalemia 10/01/2023   Left-sided weakness 10/01/2023   GI bleed 10/01/2023   Iron  deficiency anemia 05/19/2023   Osteoarthritis 02/04/2022   BRBPR (bright red blood per rectum) 02/02/2022   Hypothyroidism 02/02/2022   Anxiety 02/02/2022   Primary localized osteoarthritis of right hip 04/09/2020   S/P Nissen fundoplication (without gastrostomy tube) procedure 03/27/2019   Urge incontinence 10/21/2017   Dyslipidemia 01/31/2016   Psychosomatic factor in physical condition 01/31/2016   Aphasia    Blurred vision    Essential hypertension    Expressive aphasia 01/30/2016   Dysarthria 01/30/2016   HTN (hypertension) 01/30/2016   Dizziness    Headache  Slurred speech    Rectocele,recurrent 10/16/2015   PCP:  Marvine Rush, MD Pharmacy:   [facility] [phone] - [place], [place] - [address] [place] [address] Phone: [phone] Fax:  [phone]     Social Drivers of Health (SDOH) Social History: SDOH Screenings   Food Insecurity: No Food Insecurity (06/13/2024)  Housing: Low Risk  (06/13/2024)  Transportation Needs: No Transportation Needs (06/13/2024)  Utilities: Not At Risk (06/13/2024)  Alcohol Screen: Low Risk  (11/05/2022)  Depression (PHQ2-9): Low Risk  (04/26/2024)  Financial Resource Strain: Low Risk  (11/05/2022)  Physical Activity: Insufficiently Active (11/05/2022)  Social Connections: Socially Isolated (06/13/2024)  Stress: No Stress Concern Present (11/05/2022)  Tobacco Use: Low Risk  (06/13/2024)   SDOH Interventions:     Readmission Risk Interventions    06/14/2024    4:18 PM  Readmission Risk Prevention Plan  Post Dischage Appt Complete  Medication Screening Complete  Transportation Screening Complete

## 2024-06-14 NOTE — Progress Notes (Signed)
 [facility] PROGRESS NOTE   Sarah [name] FMW:[id number] DOB: 1951/07/21 DOA: 06/13/2024  PCP: Marvine Rush, MD  Brief History:  73 y.o. female with medical history significant for Nissen fundoplication, iron  deficiency anemia, fibromyalgia, chronic pain syndrome, rectal bleed, colonic polyps, diverticulosis, HTN, GERD, IBS, hypothyroidism, HLD, TIA and right hip bursitis s/p recent repair who presents to the ED for further evaluation rectal bleeding.  She was hospitalized for further management.    Consultants: Gastroenterology  Procedures: None yet    Subjective/Interval History: Patient complains of lower abdominal pain this morning.  Denies any nausea.  Her last bloody bowel movement was at 2 AM approximately.  Denies any dizziness or lightheadedness.    Assessment/Plan:  Lower GI bleed Drop in hemoglobin noted.  She has a history of diverticulosis and polyps.  Last colonoscopy was in June 2024. This morning she is complaining of lower abdominal pain.  She is tender in the lower abdomen.  This would be somewhat unusual with diverticular bleed.  Will proceed with CT angiogram of abdomen and pelvis. Gastroenterology to see the patient today.   History of iron  deficiency anemia/acute blood loss anemia Receives IV iron  infusion at the cancer center in [place] every 3 months.  Intolerance to oral iron  noted. Ferritin noted to be 236.  TIBC 241, percent saturation 52.  Iron  level 125.  B12 level is 432. Drop in hemoglobin noted to 9.1 today.  Continue to monitor. Trend hemoglobin.  Transfuse for hemoglobin less than 7.  History of TIA/hyperlipidemia Continue statin.  Essential hypertension/chronic diastolic CHF Volume status stable.  Blood pressure is reasonably well-controlled.  History of anxiety/insomnia Continue alprazolam .  Hypothyroidism Continue levothyroxine .  History of irritable bowel syndrome Noted to be on Linzess .  DVT Prophylaxis: SCDs Code  Status: Full code Family Communication: Discussed with patient Disposition Plan: Home when improved  Status is: Inpatient Remains inpatient appropriate because: Lower GI bleed      Medications: Scheduled:  ALPRAZolam   0.5 mg Oral QHS   atorvastatin   20 mg Oral Daily   diphenhydrAMINE   50 mg Oral QHS   levothyroxine   75 mcg Oral QAC breakfast   linaclotide   290 mcg Oral q morning   pantoprazole   40 mg Oral Daily   Continuous: PRN:acetaminophen  **OR** acetaminophen , bisacodyl , HYDROmorphone  (DILAUDID ) injection, ondansetron  **OR** ondansetron  (ZOFRAN ) IV, senna-docusate  Antibiotics: Anti-infectives (From admission, onward)    None       Objective:  Vital Signs  Vitals:   06/13/24 1851 06/13/24 2251 06/14/24 0253 06/14/24 0642  BP: 115/66 110/71 111/60 105/64  Pulse: 87 89 89 88  Resp: 16 18 18 18   Temp: 97.8 F (36.6 C) 97.8 F (36.6 C) 98.1 F (36.7 C) (!) 97.2 F (36.2 C)  TempSrc: Oral     SpO2: 99% 100% 98% 98%   No intake or output data in the 24 hours ending 06/14/24 1024 There were no vitals filed for this visit.  General appearance: Awake alert.  In no distress Resp: Clear to auscultation bilaterally.  Normal effort Cardio: S1-S2 is normal regular.  No S3-S4.  No rubs murmurs or bruit GI: Abdomen is soft.  Tender in the lower abdomen without any rebound rigidity or guarding.  No masses organomegaly. Extremities: No edema.  Full range of motion of lower extremities. Neurologic: Alert and oriented x3.  No focal neurological deficits.    Lab Results:  Data Reviewed: I have personally reviewed following labs and reports of the imaging studies  CBC: Recent Labs  Lab 06/13/24 1537 06/14/24 0528  WBC 15.2* 8.6  HGB 11.9* 9.1*  HCT 34.9* 26.7*  MCV 90.4 91.4  PLT 299 204    Basic Metabolic Panel: Recent Labs  Lab 06/13/24 1537 06/14/24 0528  NA 138 141  K 3.7 3.9  CL 103 107  CO2 20* 24  GLUCOSE 109* 117*  BUN 22 20  CREATININE 0.77  0.61  CALCIUM  9.8 8.8*    GFR: CrCl cannot be calculated (Unknown ideal weight.).  Liver Function Tests: Recent Labs  Lab 06/13/24 1537  AST 33  ALT 33  ALKPHOS 106  BILITOT 0.3  PROT 6.6  ALBUMIN 4.0    Anemia Panel: Recent Labs    06/14/24 0528  VITAMINB12 432  FERRITIN 236  TIBC 241*  IRON  125   Radiology Studies: No results found.     LOS: 1 day   Kloi Brodman  [facility] Pager on [email or web address]  06/14/2024, 10:24 AM

## 2024-06-14 NOTE — Plan of Care (Signed)
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
## 2024-06-14 NOTE — Plan of Care (Signed)
   Problem: Education: Goal: Knowledge of General Education information will improve Description Including pain rating scale, medication(s)/side effects and non-pharmacologic comfort measures Outcome: Progressing

## 2024-06-15 DIAGNOSIS — K5791 Diverticulosis of intestine, part unspecified, without perforation or abscess with bleeding: Secondary | ICD-10-CM | POA: Diagnosis not present

## 2024-06-15 DIAGNOSIS — D62 Acute posthemorrhagic anemia: Secondary | ICD-10-CM | POA: Diagnosis not present

## 2024-06-15 LAB — CBC
HCT: 25.9 % — ABNORMAL LOW (ref 36.0–46.0)
Hemoglobin: 8.2 g/dL — ABNORMAL LOW (ref 12.0–15.0)
MCH: 30.3 pg (ref 26.0–34.0)
MCHC: 31.7 g/dL (ref 30.0–36.0)
MCV: 95.6 fL (ref 80.0–100.0)
Platelets: 187 K/uL (ref 150–400)
RBC: 2.71 MIL/uL — ABNORMAL LOW (ref 3.87–5.11)
RDW: 13.2 % (ref 11.5–15.5)
WBC: 7.6 K/uL (ref 4.0–10.5)
nRBC: 0 % (ref 0.0–0.2)

## 2024-06-15 LAB — HEMOGLOBIN AND HEMATOCRIT, BLOOD
HCT: 25.9 % — ABNORMAL LOW (ref 36.0–46.0)
HCT: 27.1 % — ABNORMAL LOW (ref 36.0–46.0)
Hemoglobin: 8.4 g/dL — ABNORMAL LOW (ref 12.0–15.0)
Hemoglobin: 9.2 g/dL — ABNORMAL LOW (ref 12.0–15.0)

## 2024-06-15 LAB — BASIC METABOLIC PANEL WITH GFR
Anion gap: 10 (ref 5–15)
BUN: 17 mg/dL (ref 8–23)
CO2: 21 mmol/L — ABNORMAL LOW (ref 22–32)
Calcium: 8.3 mg/dL — ABNORMAL LOW (ref 8.9–10.3)
Chloride: 109 mmol/L (ref 98–111)
Creatinine, Ser: 0.56 mg/dL (ref 0.44–1.00)
GFR, Estimated: >60 mL/min (ref >60)
Glucose, Bld: 105 mg/dL — ABNORMAL HIGH (ref 70–99)
Potassium: 3.7 mmol/L (ref 3.5–5.1)
Sodium: 140 mmol/L (ref 135–145)

## 2024-06-15 LAB — MAGNESIUM: Magnesium: 2.3 mg/dL (ref 1.7–2.4)

## 2024-06-15 MED ORDER — POTASSIUM CHLORIDE CRYS ER 20 MEQ PO TBCR
40.0000 meq | EXTENDED_RELEASE_TABLET | Freq: Once | ORAL | Status: AC
  Administered 2024-06-15: 40 meq via ORAL
  Filled 2024-06-15: qty 2

## 2024-06-15 MED ORDER — SODIUM CHLORIDE 0.9 % IV SOLN: INTRAVENOUS | Status: AC

## 2024-06-15 NOTE — Progress Notes (Signed)
 Pt tolerating clear liquids.

## 2024-06-15 NOTE — Plan of Care (Signed)

## 2024-06-15 NOTE — Progress Notes (Signed)
 Subjective: Patient states she has had a total of 23 bowel movements.  Last bowel movement was after midnight and it was more black than bloody or red.  She continues to have lower abdominal discomfort.  Objective: Vital signs in last 24 hours: Temp:  [97.5 F (36.4 C)-98.9 F (37.2 C)] 98.9 F (37.2 C) (09/11 0503) Pulse Rate:  [90-110] 90 (09/11 0503) Resp:  [16-18] 18 (09/11 0503) BP: (104-136)/(59-76) 104/59 (09/11 0503) SpO2:  [95 %-99 %] 95 % (09/11 0503) Weight change:  Last BM Date : 06/15/24  PE: Dry oral mucosa GENERAL: Mild pallor  ABDOMEN: Soft, mild lower abdominal tenderness, bowel sounds normoactive EXTREMITIES: No deformity  Lab Results: Results for orders placed or performed during the hospital encounter of 06/13/24 (from the past 48 hours)  Comprehensive metabolic panel     Status: Abnormal   Collection Time: 06/13/24  3:37 PM  Result Value Ref Range   Sodium 138 135 - 145 mmol/L   Potassium 3.7 3.5 - 5.1 mmol/L   Chloride 103 98 - 111 mmol/L   CO2 20 (L) 22 - 32 mmol/L   Glucose, Bld 109 (H) 70 - 99 mg/dL    Comment: Glucose reference range applies only to samples taken after fasting for at least 8 hours.   BUN 22 8 - 23 mg/dL   Creatinine, Ser 9.22 0.44 - 1.00 mg/dL   Calcium  9.8 8.9 - 10.3 mg/dL   Total Protein 6.6 6.5 - 8.1 g/dL   Albumin 4.0 3.5 - 5.0 g/dL   AST 33 15 - 41 U/L   ALT 33 0 - 44 U/L   Alkaline Phosphatase 106 38 - 126 U/L   Total Bilirubin 0.3 0.0 - 1.2 mg/dL   GFR, Estimated >39 >39 mL/min    Comment: (NOTE) Calculated using the CKD-EPI Creatinine Equation (2021)    Anion gap 15 5 - 15    Comment: Performed at [facility], [address] [place], [place] [address]  CBC     Status: Abnormal   Collection Time: 06/13/24  3:37 PM  Result Value Ref Range   WBC 15.2 (H) 4.0 - 10.5 K/uL   RBC 3.86 (L) 3.87 - 5.11 MIL/uL   Hemoglobin 11.9 (L) 12.0 - 15.0 g/dL   HCT 65.0 (L) 63.9 - 53.9 %   MCV 90.4 80.0 - 100.0  fL   MCH 30.8 26.0 - 34.0 pg   MCHC 34.1 30.0 - 36.0 g/dL   RDW 87.1 88.4 - 84.4 %   Platelets 299 150 - 400 K/uL   nRBC 0.0 0.0 - 0.2 %    Comment: Performed at [facility], [address], [place], [place] [address]  Type and screen [facility]     Status: None   Collection Time: 06/13/24  3:37 PM  Result Value Ref Range   ABO/RH(D) A POS    Antibody Screen NEG    Sample Expiration      06/16/2024,2359 Performed at [facility], [address] [place], [place] [address]   POC occult blood, ED     Status: Abnormal   Collection Time: 06/13/24  5:40 PM  Result Value Ref Range   Fecal Occult Bld POSITIVE (A) NEGATIVE  Basic metabolic panel     Status: Abnormal   Collection Time: 06/14/24  5:28 AM  Result Value Ref Range   Sodium 141 135 - 145 mmol/L   Potassium 3.9 3.5 - 5.1 mmol/L  Chloride 107 98 - 111 mmol/L   CO2 24 22 - 32 mmol/L   Glucose, Bld 117 (H) 70 - 99 mg/dL    Comment: Glucose reference range applies only to samples taken after fasting for at least 8 hours.   BUN 20 8 - 23 mg/dL   Creatinine, Ser 9.38 0.44 - 1.00 mg/dL   Calcium  8.8 (L) 8.9 - 10.3 mg/dL   GFR, Estimated >39 >39 mL/min    Comment: (NOTE) Calculated using the CKD-EPI Creatinine Equation (2021)    Anion gap 10 5 - 15    Comment: Performed at [facility], [address], [place], [place] [address]  CBC     Status: Abnormal   Collection Time: 06/14/24  5:28 AM  Result Value Ref Range   WBC 8.6 4.0 - 10.5 K/uL   RBC 2.92 (L) 3.87 - 5.11 MIL/uL   Hemoglobin 9.1 (L) 12.0 - 15.0 g/dL   HCT 73.2 (L) 63.9 - 53.9 %   MCV 91.4 80.0 - 100.0 fL   MCH 31.2 26.0 - 34.0 pg   MCHC 34.1 30.0 - 36.0 g/dL   RDW 86.8 88.4 - 84.4 %   Platelets 204 150 - 400 K/uL   nRBC 0.0 0.0 - 0.2 %    Comment: Performed at [facility], [address], [place], [place] [address]  Iron  and TIBC     Status: Abnormal    Collection Time: 06/14/24  5:28 AM  Result Value Ref Range   Iron  125 28 - 170 ug/dL   TIBC 758 (L) 749 - 549 ug/dL   Saturation Ratios 52 (H) 10.4 - 31.8 %   UIBC 116 ug/dL    Comment: Performed at [facility], [address] [place], [place] [address]  Ferritin     Status: None   Collection Time: 06/14/24  5:28 AM  Result Value Ref Range   Ferritin 236 11 - 307 ng/mL    Comment: Performed at [facility], [address], [place], [place] [address]  Vitamin B12     Status: None   Collection Time: 06/14/24  5:28 AM  Result Value Ref Range   Vitamin B-12 432 180 - 914 pg/mL    Comment: Performed at [facility], [address], [place], [place] [address]  Hemoglobin and hematocrit, blood     Status: Abnormal   Collection Time: 06/14/24 11:49 AM  Result Value Ref Range   Hemoglobin 8.9 (L) 12.0 - 15.0 g/dL   HCT 72.9 (L) 63.9 - 53.9 %    Comment: Performed at [facility], [address], [place], [place] [address]  Hemoglobin and hematocrit, blood     Status: Abnormal   Collection Time: 06/14/24  3:49 PM  Result Value Ref Range   Hemoglobin 9.7 (L) 12.0 - 15.0 g/dL   HCT 71.2 (L) 63.9 - 53.9 %    Comment: Performed at [facility], [address], [place], [place] [address]  Hemoglobin and hematocrit, blood     Status: Abnormal   Collection Time: 06/14/24  9:46 PM  Result Value Ref Range   Hemoglobin 8.2 (L) 12.0 - 15.0 g/dL   HCT 74.9 (L) 63.9 - 53.9 %    Comment: Performed at [facility], [address], [place], [place] [address]  CBC     Status: Abnormal   Collection Time: 06/15/24  5:07 AM  Result Value Ref Range  WBC 7.6 4.0 - 10.5 K/uL   RBC 2.71 (L) 3.87 - 5.11 MIL/uL   Hemoglobin 8.2 (L) 12.0 - 15.0 g/dL   HCT 74.0 (L) 63.9 - 53.9 %   MCV 95.6 80.0 - 100.0 fL   MCH 30.3 26.0 - 34.0 pg   MCHC 31.7 30.0 - 36.0 g/dL   RDW 86.7 88.4 - 84.4 %   Platelets 187 150 -  400 K/uL   nRBC 0.0 0.0 - 0.2 %    Comment: Performed at [facility] [address] [place], [place] [address]  Basic metabolic panel with GFR     Status: Abnormal   Collection Time: 06/15/24  5:07 AM  Result Value Ref Range   Sodium 140 135 - 145 mmol/L   Potassium 3.7 3.5 - 5.1 mmol/L   Chloride 109 98 - 111 mmol/L   CO2 21 (L) 22 - 32 mmol/L   Glucose, Bld 105 (H) 70 - 99 mg/dL    Comment: Glucose reference range applies only to samples taken after fasting for at least 8 hours.   BUN 17 8 - 23 mg/dL   Creatinine, Ser 9.43 0.44 - 1.00 mg/dL   Calcium  8.3 (L) 8.9 - 10.3 mg/dL   GFR, Estimated >39 >39 mL/min    Comment: (NOTE) Calculated using the CKD-EPI Creatinine Equation (2021)    Anion gap 10 5 - 15    Comment: Performed at [facility], [address] [place], [place] [address]  Magnesium     Status: None   Collection Time: 06/15/24  5:07 AM  Result Value Ref Range   Magnesium 2.3 1.7 - 2.4 mg/dL    Comment: Performed at [facility], [address], [place], [place] [address]    Studies/Results: CT Angio Abd/Pel w/ and/or w/o Result Date: 06/14/2024 CLINICAL DATA:  Lower gastrointestinal bleeding EXAM: CTA ABDOMEN AND PELVIS WITHOUT AND WITH CONTRAST TECHNIQUE: Multidetector CT imaging of the abdomen and pelvis was performed using the standard protocol during bolus administration of intravenous contrast. Multiplanar reconstructed images and MIPs were obtained and reviewed to evaluate the vascular anatomy. RADIATION DOSE REDUCTION: This exam was performed according to the departmental dose-optimization program which includes automated exposure control, adjustment of the mA and/or kV according to patient size and/or use of iterative reconstruction technique. CONTRAST:  OMNIPAQUE  IOHEXOL  350 MG/ML SOLN COMPARISON:  10/13/2023 FINDINGS: VASCULAR Aorta: Atherosclerosis is present, including aortoiliac atherosclerotic  disease. No aneurysm or abdominal aortic dissection identified. Celiac: Mild proximal narrowing of the celiac trunk likely attributable to median arcuate ligament. Minimal dorsal atheromatous plaque at the origin of the celiac trunk. SMA: Mild dorsal atheromatous plaque proximally in the superior mesenteric artery and scattered mild atheromatous plaque distally in the SMA. Renals: Scattered atheromatous plaque in the renal arteries, most notable along the proximal anterior margin of the right renal artery, and likely causing mild to moderate right renal artery stenosis proximally. IMA: Patent Inflow: Atheromatous vascular calcifications. Proximal Outflow: Atherosclerosis. Veins: Unremarkable Review of the MIP images confirms the above findings. NON-VASCULAR Lower chest: Hiatal hernia likely including a fundoplication wrap. Coronary and descending thoracic aortic atherosclerotic vascular calcifications. Hepatobiliary: Cholecystectomy. Common bile duct 0.9 cm in diameter, formerly 0.7 cm. No intrahepatic biliary dilatation. Stable benign appearing 5 mm hypodense lesion inferiorly in the right hepatic lobe on image 29 series 7. No further imaging workup of this lesion is indicated. Pancreas: Unremarkable Spleen: Unremarkable Adrenals/Urinary Tract: 1.0 cm left mid upper kidney cyst posteriorly,  image 57 series 5. This has fluid density characteristics. No further imaging workup of this lesion is indicated. Adrenal glands unremarkable. Stomach/Bowel: Hiatal hernia containing the fundoplication. Sigmoid colon diverticulosis. Scattered diverticula elsewhere throughout the colon. Upper normal caliber of the appendix at 0.7 cm. I do not perceive pooling of contrast medium in the bowel lumen on arterial or delayed phase images to indicate a source of active bleeding. Lymphatic: No pathologic adenopathy. Reproductive: Uterus absent. Other: No supplemental non-categorized findings. Musculoskeletal: Lateral to the right gluteus  maximus muscle, a 5.5 by 2.8 by 11.2 cm subcutaneous fluid collection is observed. The patient has a right hip prosthesis and this could reflect a chronic postoperative seroma. IMPRESSION: 1. No active bleeding source is identified. 2. Sigmoid colon diverticulosis and scattered diverticula elsewhere in the colon. 3.  Aortic Atherosclerosis (ICD10-I70.0).  Systemic atherosclerosis. 4. Mild to moderate stenosis of the proximal right renal artery. 5. Hiatal hernia containing the fundoplication wrap. 6. 5.5 by 2.8 by 11.2 cm subcutaneous fluid collection lateral to the right gluteus maximus muscle, probably a chronic postoperative seroma given the nearby right hip implant. 7. Upper normal caliber of the appendix at 0.7 cm. No periappendiceal inflammatory findings. 8.  Aortic Atherosclerosis (ICD10-I70.0). Electronically Signed   By: Ryan Salvage M.D.   On: 06/14/2024 11:47    Medications: I have reviewed the patient's current medications.  Assessment: Rectal bleeding, hemoglobin 8.2(previous readings 9.1/8.9/9.7/8.2), has not required blood transfusion  CT angio showed no active bleeding source identified, sigmoid colonic diverticulosis and scattered diverticula elsewhere Hiatal hernia containing fundoplication wrap  Lab abnormalities: Bicarb 21  Plan: Presentation consistent with diverticular bleed. She is slightly hypotensive and slightly tachycardic, rectal bleeding seems to have slowed down, hemoglobin seems to be stable in the last 24 hours, CT angio did not show active bleeding.  I will start on clear liquid diet. Recommend monitoring H&H and transfuse if hemoglobin is less than 7 or there is signs of hemodynamic instability. If there is concern for active ongoing bleeding patient will benefit from NM GI blood loss scan and IR involvement.  Estelita Manas, MD 06/15/2024, 7:47 AM

## 2024-06-15 NOTE — Progress Notes (Signed)
 [facility] PROGRESS NOTE   Sarah [name] FMW:[id number] DOB: 14-Mar-1951 DOA: 06/13/2024  PCP: Marvine Rush, MD  Brief History:  73 y.o. female with medical history significant for Nissen fundoplication, iron  deficiency anemia, fibromyalgia, chronic pain syndrome, rectal bleed, colonic polyps, diverticulosis, HTN, GERD, IBS, hypothyroidism, HLD, TIA and right hip bursitis s/p recent repair who presents to the ED for further evaluation rectal bleeding.  She was hospitalized for further management.    Consultants: Gastroenterology  Procedures: None yet   Subjective/Interval History: Patient denies any abdominal pain this morning.  Mentions that she had 1 episode of bowel movement with some blood in it overnight but none this morning.  Denies any nausea vomiting.  No dizziness or lightheadedness.      Assessment/Plan:  Lower GI bleed Drop in hemoglobin noted.  She has a history of diverticulosis and polyps.  Last colonoscopy was in June 2024. Patient underwent CT angiogram which did not show any acute bleeding.  Abdominal pain appears to have subsided. Continue to monitor for GI bleed.  Seems to be slowing down. Gastroenterology is following. If she has bleeding again she will need a nuclear medicine bleeding scan and interventional radiology consultation.  History of iron  deficiency anemia/acute blood loss anemia Receives IV iron  infusion at the cancer center in [place] every 3 months.  Intolerance to oral iron  noted. Ferritin noted to be 236.  TIBC 241, percent saturation 52.  Iron  level 125.  B12 level is 432. Drop in hemoglobin noted but stable over the last 12 to 24 hours.   Trend hemoglobin.  Transfuse for hemoglobin less than 7.  Fluid collection in the right buttock area This was incidentally noted on CT scan.  She recently underwent repair of the gluteus minimus tendon by Dr. Beverley on 05/30/2024.  This fluid collection is likely a sequelae of that procedure.   Examination does not reveal any concerning findings.  Mild swelling is noted.  No erythema.  No fluctuation.  Incision site is clean.  Probably a seroma.  History of TIA/hyperlipidemia Continue statin.  Essential hypertension/chronic diastolic CHF Volume status stable.  Blood pressure is reasonably well-controlled.  History of anxiety/insomnia Continue alprazolam .  Hypothyroidism Continue levothyroxine .  History of irritable bowel syndrome Noted to be on Linzess  which is currently on hold.  DVT Prophylaxis: SCDs Code Status: Full code Family Communication: Discussed with patient Disposition Plan: Home when improved.  Mobilize.     Medications: Scheduled:  ALPRAZolam   0.5 mg Oral QHS   atorvastatin   20 mg Oral Daily   diphenhydrAMINE   50 mg Oral QHS   levothyroxine   75 mcg Oral QAC breakfast   pantoprazole   40 mg Oral Daily   Continuous:  sodium chloride  75 mL/hr at 06/14/24 1459   PRN:acetaminophen  **OR** acetaminophen , HYDROmorphone  (DILAUDID ) injection, ondansetron  **OR** ondansetron  (ZOFRAN ) IV, oxyCODONE     Objective:  Vital Signs  Vitals:   06/14/24 1342 06/14/24 1508 06/14/24 2047 06/15/24 0503  BP: 105/65 127/76 136/71 (!) 104/59  Pulse: 95 (!) 110 92 90  Resp: 16 16 18 18   Temp: 98.4 F (36.9 C)  (!) 97.5 F (36.4 C) 98.9 F (37.2 C)  TempSrc:   Oral Oral  SpO2: 99% 99% 99% 95%    Intake/Output Summary (Last 24 hours) at 06/15/2024 0904 Last data filed at 06/14/2024 1840 Gross per 24 hour  Intake --  Output 200 ml  Net -200 ml    General appearance: Awake alert.  In no distress Resp: Clear to auscultation bilaterally.  Normal effort Cardio: S1-S2 is normal regular.  No S3-S4.  No rubs murmurs or bruit GI: Abdomen is soft.  Nontender nondistended.  Bowel sounds are present normal.  No masses organomegaly Extremities: Examination of the buttock area reveals mild swelling over the operative site.  No erythema noted over the incision.  No drainage  from the incision site. No focal neurological deficits.  Lab Results:  Data Reviewed: I have personally reviewed following labs and reports of the imaging studies  CBC: Recent Labs  Lab 06/13/24 1537 06/14/24 0528 06/14/24 1149 06/14/24 1549 06/14/24 2146 06/15/24 0507  WBC 15.2* 8.6  --   --   --  7.6  HGB 11.9* 9.1* 8.9* 9.7* 8.2* 8.2*  HCT 34.9* 26.7* 27.0* 28.7* 25.0* 25.9*  MCV 90.4 91.4  --   --   --  95.6  PLT 299 204  --   --   --  187    Basic Metabolic Panel: Recent Labs  Lab 06/13/24 1537 06/14/24 0528 06/15/24 0507  NA 138 141 140  K 3.7 3.9 3.7  CL 103 107 109  CO2 20* 24 21*  GLUCOSE 109* 117* 105*  BUN 22 20 17   CREATININE 0.77 0.61 0.56  CALCIUM  9.8 8.8* 8.3*  MG  --   --  2.3    GFR: CrCl cannot be calculated (Unknown ideal weight.).  Liver Function Tests: Recent Labs  Lab 06/13/24 1537  AST 33  ALT 33  ALKPHOS 106  BILITOT 0.3  PROT 6.6  ALBUMIN 4.0    Anemia Panel: Recent Labs    06/14/24 0528  VITAMINB12 432  FERRITIN 236  TIBC 241*  IRON  125   Radiology Studies: CT Angio Abd/Pel w/ and/or w/o Result Date: 06/14/2024 CLINICAL DATA:  Lower gastrointestinal bleeding EXAM: CTA ABDOMEN AND PELVIS WITHOUT AND WITH CONTRAST TECHNIQUE: Multidetector CT imaging of the abdomen and pelvis was performed using the standard protocol during bolus administration of intravenous contrast. Multiplanar reconstructed images and MIPs were obtained and reviewed to evaluate the vascular anatomy. RADIATION DOSE REDUCTION: This exam was performed according to the departmental dose-optimization program which includes automated exposure control, adjustment of the mA and/or kV according to patient size and/or use of iterative reconstruction technique. CONTRAST:  OMNIPAQUE  IOHEXOL  350 MG/ML SOLN COMPARISON:  10/13/2023 FINDINGS: VASCULAR Aorta: Atherosclerosis is present, including aortoiliac atherosclerotic disease. No aneurysm or abdominal aortic  dissection identified. Celiac: Mild proximal narrowing of the celiac trunk likely attributable to median arcuate ligament. Minimal dorsal atheromatous plaque at the origin of the celiac trunk. SMA: Mild dorsal atheromatous plaque proximally in the superior mesenteric artery and scattered mild atheromatous plaque distally in the SMA. Renals: Scattered atheromatous plaque in the renal arteries, most notable along the proximal anterior margin of the right renal artery, and likely causing mild to moderate right renal artery stenosis proximally. IMA: Patent Inflow: Atheromatous vascular calcifications. Proximal Outflow: Atherosclerosis. Veins: Unremarkable Review of the MIP images confirms the above findings. NON-VASCULAR Lower chest: Hiatal hernia likely including a fundoplication wrap. Coronary and descending thoracic aortic atherosclerotic vascular calcifications. Hepatobiliary: Cholecystectomy. Common bile duct 0.9 cm in diameter, formerly 0.7 cm. No intrahepatic biliary dilatation. Stable benign appearing 5 mm hypodense lesion inferiorly in the right hepatic lobe on image 29 series 7. No further imaging workup of this lesion is indicated. Pancreas: Unremarkable Spleen: Unremarkable Adrenals/Urinary Tract: 1.0 cm left mid upper kidney cyst posteriorly, image 57 series 5. This has fluid density characteristics. No further imaging workup of this lesion  is indicated. Adrenal glands unremarkable. Stomach/Bowel: Hiatal hernia containing the fundoplication. Sigmoid colon diverticulosis. Scattered diverticula elsewhere throughout the colon. Upper normal caliber of the appendix at 0.7 cm. I do not perceive pooling of contrast medium in the bowel lumen on arterial or delayed phase images to indicate a source of active bleeding. Lymphatic: No pathologic adenopathy. Reproductive: Uterus absent. Other: No supplemental non-categorized findings. Musculoskeletal: Lateral to the right gluteus maximus muscle, a 5.5 by 2.8 by 11.2 cm  subcutaneous fluid collection is observed. The patient has a right hip prosthesis and this could reflect a chronic postoperative seroma. IMPRESSION: 1. No active bleeding source is identified. 2. Sigmoid colon diverticulosis and scattered diverticula elsewhere in the colon. 3.  Aortic Atherosclerosis (ICD10-I70.0).  Systemic atherosclerosis. 4. Mild to moderate stenosis of the proximal right renal artery. 5. Hiatal hernia containing the fundoplication wrap. 6. 5.5 by 2.8 by 11.2 cm subcutaneous fluid collection lateral to the right gluteus maximus muscle, probably a chronic postoperative seroma given the nearby right hip implant. 7. Upper normal caliber of the appendix at 0.7 cm. No periappendiceal inflammatory findings. 8.  Aortic Atherosclerosis (ICD10-I70.0). Electronically Signed   By: Ryan Salvage M.D.   On: 06/14/2024 11:47       LOS: 2 days   [place] Verdene  [facility] Pager on [email or web address]  06/15/2024, 9:04 AM

## 2024-06-16 DIAGNOSIS — K5791 Diverticulosis of intestine, part unspecified, without perforation or abscess with bleeding: Secondary | ICD-10-CM | POA: Diagnosis not present

## 2024-06-16 LAB — CBC
HCT: 25 % — ABNORMAL LOW (ref 36.0–46.0)
Hemoglobin: 8.2 g/dL — ABNORMAL LOW (ref 12.0–15.0)
MCH: 31.1 pg (ref 26.0–34.0)
MCHC: 32.8 g/dL (ref 30.0–36.0)
MCV: 94.7 fL (ref 80.0–100.0)
Platelets: 200 K/uL (ref 150–400)
RBC: 2.64 MIL/uL — ABNORMAL LOW (ref 3.87–5.11)
RDW: 13.1 % (ref 11.5–15.5)
WBC: 6.7 K/uL (ref 4.0–10.5)
nRBC: 0 % (ref 0.0–0.2)

## 2024-06-16 LAB — BASIC METABOLIC PANEL WITH GFR
Anion gap: 11 (ref 5–15)
BUN: 11 mg/dL (ref 8–23)
CO2: 21 mmol/L — ABNORMAL LOW (ref 22–32)
Calcium: 8.8 mg/dL — ABNORMAL LOW (ref 8.9–10.3)
Chloride: 109 mmol/L (ref 98–111)
Creatinine, Ser: 0.6 mg/dL (ref 0.44–1.00)
GFR, Estimated: >60 mL/min (ref >60)
Glucose, Bld: 102 mg/dL — ABNORMAL HIGH (ref 70–99)
Potassium: 3.9 mmol/L (ref 3.5–5.1)
Sodium: 141 mmol/L (ref 135–145)

## 2024-06-16 MED ORDER — PSYLLIUM 95 % PO PACK
1.0000 | PACK | Freq: Every day | ORAL | Status: DC
  Administered 2024-06-16: 1 via ORAL
  Filled 2024-06-16: qty 1

## 2024-06-16 MED ORDER — PSYLLIUM 95 % PO PACK: 1.0000 | PACK | Freq: Every day | ORAL | 1 refills | Status: DC

## 2024-06-16 NOTE — Discharge Summary (Signed)
 [facility]  Physician Discharge Summary   Patient ID: Sarah [name] MRN: [id number] DOB/AGE: Sep 17, 1951 73 y.o.  Admit date: 06/13/2024 Discharge date: 06/16/2024    PCP: Marvine Rush, MD  DISCHARGE DIAGNOSES:    Chronic diastolic HF (heart failure) (HCC)   Rectal bleeding   RECOMMENDATIONS FOR OUTPATIENT FOLLOW UP: Outpatient follow-up with gastroenterology as needed   Home Health: None Equipment/Devices: None  CODE STATUS: Full code  DISCHARGE CONDITION: fair  Diet recommendation: As before  INITIAL HISTORY: 73 y.o. female with medical history significant for Nissen fundoplication, iron  deficiency anemia, fibromyalgia, chronic pain syndrome, rectal bleed, colonic polyps, diverticulosis, HTN, GERD, IBS, hypothyroidism, HLD, TIA and right hip bursitis s/p recent repair who presents to the ED for further evaluation rectal bleeding.  She was hospitalized for further management.     Consultants: Gastroenterology   Procedures: None  HOSPITAL COURSE:   Lower GI bleed Drop in hemoglobin noted.  She has a history of diverticulosis and polyps.  Last colonoscopy was in June 2024. Patient underwent CT angiogram which did not show any acute bleeding.  Abdominal pain appears to have subsided. Bleeding has subsided.  Hemoglobin has been stable.  Cleared for discharge by gastroenterology this morning.  Will need bowel regimen to avoid constipation going forward.  She may resume her Linzess .  Metamucil Benefiber and psyllium as instructed by gastroenterology.   History of iron  deficiency anemia/acute blood loss anemia Receives IV iron  infusion at the cancer center in [place] every 3 months.  Intolerance to oral iron  noted. Ferritin noted to be 236.  TIBC 241, percent saturation 52.  Iron  level 125.  B12 level is 432. Did have a drop in hemoglobin's but stable over the last 48 hours.   Fluid collection in the right buttock area This was incidentally noted on CT scan.   She recently underwent repair of the gluteus minimus tendon by Dr. Beverley on 05/30/2024.  This fluid collection is likely a sequelae of that procedure.  Examination does not reveal any concerning findings.  Mild swelling is noted.  No erythema.  No fluctuation.  Incision site is clean.  Probably a seroma. Has been able to ambulate without difficulty.   History of TIA/hyperlipidemia Continue statin.   Essential hypertension/chronic diastolic CHF Volume status stable.  Blood pressure is reasonably well-controlled.   History of anxiety/insomnia Continue alprazolam .   Hypothyroidism Continue levothyroxine .   History of irritable bowel syndrome Continue with home medication regimen    Patient is stable.  Cleared by gastroenterology for discharge.  Okay for discharge today.  PERTINENT LABS:  The results of significant diagnostics from this hospitalization (including imaging, microbiology, ancillary and laboratory) are listed below for reference.     Labs:   Basic Metabolic Panel: Recent Labs  Lab 06/13/24 1537 06/14/24 0528 06/15/24 0507 06/16/24 0457  NA 138 141 140 141  K 3.7 3.9 3.7 3.9  CL 103 107 109 109  CO2 20* 24 21* 21*  GLUCOSE 109* 117* 105* 102*  BUN 22 20 17 11   CREATININE 0.77 0.61 0.56 0.60  CALCIUM  9.8 8.8* 8.3* 8.8*  MG  --   --  2.3  --    Liver Function Tests: Recent Labs  Lab 06/13/24 1537  AST 33  ALT 33  ALKPHOS 106  BILITOT 0.3  PROT 6.6  ALBUMIN 4.0   CBC: Recent Labs  Lab 06/13/24 1537 06/14/24 0528 06/14/24 1149 06/14/24 2146 06/15/24 0507 06/15/24 1152 06/15/24 1743 06/16/24 0457  WBC 15.2* 8.6  --   --  7.6  --   --  6.7  HGB 11.9* 9.1*   < > 8.2* 8.2* 8.4* 9.2* 8.2*  HCT 34.9* 26.7*   < > 25.0* 25.9* 25.9* 27.1* 25.0*  MCV 90.4 91.4  --   --  95.6  --   --  94.7  PLT 299 204  --   --  187  --   --  200   < > = values in this interval not displayed.     IMAGING STUDIES CT Angio Abd/Pel w/ and/or w/o Result Date:  06/14/2024 CLINICAL DATA:  Lower gastrointestinal bleeding EXAM: CTA ABDOMEN AND PELVIS WITHOUT AND WITH CONTRAST TECHNIQUE: Multidetector CT imaging of the abdomen and pelvis was performed using the standard protocol during bolus administration of intravenous contrast. Multiplanar reconstructed images and MIPs were obtained and reviewed to evaluate the vascular anatomy. RADIATION DOSE REDUCTION: This exam was performed according to the departmental dose-optimization program which includes automated exposure control, adjustment of the mA and/or kV according to patient size and/or use of iterative reconstruction technique. CONTRAST:  OMNIPAQUE  IOHEXOL  350 MG/ML SOLN COMPARISON:  10/13/2023 FINDINGS: VASCULAR Aorta: Atherosclerosis is present, including aortoiliac atherosclerotic disease. No aneurysm or abdominal aortic dissection identified. Celiac: Mild proximal narrowing of the celiac trunk likely attributable to median arcuate ligament. Minimal dorsal atheromatous plaque at the origin of the celiac trunk. SMA: Mild dorsal atheromatous plaque proximally in the superior mesenteric artery and scattered mild atheromatous plaque distally in the SMA. Renals: Scattered atheromatous plaque in the renal arteries, most notable along the proximal anterior margin of the right renal artery, and likely causing mild to moderate right renal artery stenosis proximally. IMA: Patent Inflow: Atheromatous vascular calcifications. Proximal Outflow: Atherosclerosis. Veins: Unremarkable Review of the MIP images confirms the above findings. NON-VASCULAR Lower chest: Hiatal hernia likely including a fundoplication wrap. Coronary and descending thoracic aortic atherosclerotic vascular calcifications. Hepatobiliary: Cholecystectomy. Common bile duct 0.9 cm in diameter, formerly 0.7 cm. No intrahepatic biliary dilatation. Stable benign appearing 5 mm hypodense lesion inferiorly in the right hepatic lobe on image 29 series 7. No further  imaging workup of this lesion is indicated. Pancreas: Unremarkable Spleen: Unremarkable Adrenals/Urinary Tract: 1.0 cm left mid upper kidney cyst posteriorly, image 57 series 5. This has fluid density characteristics. No further imaging workup of this lesion is indicated. Adrenal glands unremarkable. Stomach/Bowel: Hiatal hernia containing the fundoplication. Sigmoid colon diverticulosis. Scattered diverticula elsewhere throughout the colon. Upper normal caliber of the appendix at 0.7 cm. I do not perceive pooling of contrast medium in the bowel lumen on arterial or delayed phase images to indicate a source of active bleeding. Lymphatic: No pathologic adenopathy. Reproductive: Uterus absent. Other: No supplemental non-categorized findings. Musculoskeletal: Lateral to the right gluteus maximus muscle, a 5.5 by 2.8 by 11.2 cm subcutaneous fluid collection is observed. The patient has a right hip prosthesis and this could reflect a chronic postoperative seroma. IMPRESSION: 1. No active bleeding source is identified. 2. Sigmoid colon diverticulosis and scattered diverticula elsewhere in the colon. 3.  Aortic Atherosclerosis (ICD10-I70.0).  Systemic atherosclerosis. 4. Mild to moderate stenosis of the proximal right renal artery. 5. Hiatal hernia containing the fundoplication wrap. 6. 5.5 by 2.8 by 11.2 cm subcutaneous fluid collection lateral to the right gluteus maximus muscle, probably a chronic postoperative seroma given the nearby right hip implant. 7. Upper normal caliber of the appendix at 0.7 cm. No periappendiceal inflammatory findings. 8.  Aortic Atherosclerosis (ICD10-I70.0). Electronically Signed   By: Ryan Ramond HERO.D.  On: 06/14/2024 11:47    DISCHARGE EXAMINATION: See progress note from earlier today  DISPOSITION: Home  Discharge Instructions     Call MD for:  difficulty breathing, headache or visual disturbances   Complete by: As directed    Call MD for:  extreme fatigue   Complete by:  As directed    Call MD for:  persistant dizziness or light-headedness   Complete by: As directed    Call MD for:  persistant nausea and vomiting   Complete by: As directed    Call MD for:  severe uncontrolled pain   Complete by: As directed    Call MD for:  temperature >100.4   Complete by: As directed    Diet - low sodium heart healthy   Complete by: As directed    Discharge instructions   Complete by: As directed    Please follow instructions provided by the gastroenterologist.  They have advised that you take Metamucil/Benefiber/psyllium husk at least once a day on a daily basis.  Seek attention if you start bleeding again.  You were cared for by a hospitalist during your hospital stay. If you have any questions about your discharge medications or the care you received while you were in the hospital after you are discharged, you can call the unit and asked to speak with the hospitalist on call if the hospitalist that took care of you is not available. Once you are discharged, your primary care physician will handle any further medical issues. Please note that NO REFILLS for any discharge medications will be authorized once you are discharged, as it is imperative that you return to your primary care physician (or establish a relationship with a primary care physician if you do not have one) for your aftercare needs so that they can reassess your need for medications and monitor your lab values. If you do not have a primary care physician, you can call [phone] for a physician referral.   Increase activity slowly   Complete by: As directed    No wound care   Complete by: As directed          Allergies as of 06/16/2024       Reactions   Penicillins Itching, Other (See Comments), Rash   Tolerated Cephalosporins 04/24/2020. Has patient had a PCN reaction causing immediate rash, facial/tongue/throat swelling, SOB or lightheadedness with hypotension: Yes Has patient had a PCN reaction  causing severe rash involving mucus membranes or skin necrosis: No Has patient had a PCN reaction that required hospitalization No Has patient had a PCN reaction occurring within the last 10 years: No Patient states allergy noted in the 1970s.        Medication List     STOP taking these medications    baclofen  10 MG tablet Commonly known as: LIORESAL    ondansetron  4 MG disintegrating tablet Commonly known as: ZOFRAN -ODT   traMADol  50 MG tablet Commonly known as: ULTRAM        TAKE these medications    acetaminophen  650 MG CR tablet Commonly known as: TYLENOL  Take 1,300 mg by mouth See admin instructions. Take 1,300 mg by mouth with lunch and an additional 1,300 mg up to two times a day for pain   ALPRAZolam  0.5 MG tablet Commonly known as: XANAX  Take 0.5 mg by mouth at bedtime.   atorvastatin  20 MG tablet Commonly known as: Lipitor Take 1 tablet (20 mg total) by mouth daily.   Benadryl  Allergy 25 MG capsule Generic drug: diphenhydrAMINE   Take 50 mg by mouth at bedtime.   esomeprazole 20 MG capsule Commonly known as: NEXIUM Take 20 mg by mouth daily before breakfast.   furosemide  40 MG tablet Commonly known as: LASIX  Take 40 mg by mouth in the morning.   HYDROcodone -acetaminophen  10-325 MG tablet Commonly known as: NORCO Take 1 tablet by mouth every 6 (six) hours as needed for severe pain (pain score 7-10). in hip after surgery   Linzess  290 MCG Caps capsule Generic drug: linaclotide  Take 290 mcg by mouth every morning.   phentermine  37.5 MG tablet Commonly known as: ADIPEX-P  Take 18.75 mg by mouth every morning.   psyllium 95 % Pack Commonly known as: HYDROCIL/METAMUCIL Take 1 packet by mouth daily.   Synthroid  75 MCG tablet Generic drug: levothyroxine  Take 75 mcg by mouth daily before breakfast.           TOTAL DISCHARGE TIME: 35 minutes  Kalib Bhagat [employer] on [email or web address]  06/17/2024, 10:52 AM

## 2024-06-16 NOTE — Progress Notes (Signed)
 [facility] PROGRESS NOTE   Sarah [name] FMW:[id number] DOB: May 09, 1951 DOA: 06/13/2024  PCP: Marvine Rush, MD  Brief History:  73 y.o. female with medical history significant for Nissen fundoplication, iron  deficiency anemia, fibromyalgia, chronic pain syndrome, rectal bleed, colonic polyps, diverticulosis, HTN, GERD, IBS, hypothyroidism, HLD, TIA and right hip bursitis s/p recent repair who presents to the ED for further evaluation rectal bleeding.  She was hospitalized for further management.    Consultants: Gastroenterology  Procedures: None yet   Subjective/Interval History: Mentioned that she had 1 bowel movement in the last 24 hours with some blood in it.  Denies any abdominal pain nausea or vomiting.  Bleeding has significantly slowed down.  She feels better.  She has ambulated in the hallway.     Assessment/Plan:  Lower GI bleed Drop in hemoglobin noted.  She has a history of diverticulosis and polyps.  Last colonoscopy was in June 2024. Patient underwent CT angiogram which did not show any acute bleeding.  Abdominal pain appears to have subsided. Rectal bleed seems to be slowing down.  Hemoglobin has been stable.  Gastroenterology continues to follow.  Diet has been advanced.  Hopefully discharge home soon. If she rebleeds then she will need a nuclear medicine bleeding scan and interventional radiology consultation.  History of iron  deficiency anemia/acute blood loss anemia Receives IV iron  infusion at the cancer center in [place] every 3 months.  Intolerance to oral iron  noted. Ferritin noted to be 236.  TIBC 241, percent saturation 52.  Iron  level 125.  B12 level is 432. Drop in hemoglobin noted but stable over the last 48 hours.  No need to check it as frequently as we have been.  Will check once this afternoon.  Fluid collection in the right buttock area This was incidentally noted on CT scan.  She recently underwent repair of the gluteus minimus tendon by  Dr. Beverley on 05/30/2024.  This fluid collection is likely a sequelae of that procedure.  Examination does not reveal any concerning findings.  Mild swelling is noted.  No erythema.  No fluctuation.  Incision site is clean.  Probably a seroma.  History of TIA/hyperlipidemia Continue statin.  Essential hypertension/chronic diastolic CHF Volume status stable.  Blood pressure is reasonably well-controlled.  History of anxiety/insomnia Continue alprazolam .  Hypothyroidism Continue levothyroxine .  History of irritable bowel syndrome Noted to be on Linzess  which is currently on hold.  Should be able to resume at discharge.  DVT Prophylaxis: SCDs Code Status: Full code Family Communication: Discussed with patient Disposition Plan: Home when improved.  Mobilize.     Medications: Scheduled:  ALPRAZolam   0.5 mg Oral QHS   atorvastatin   20 mg Oral Daily   diphenhydrAMINE   50 mg Oral QHS   levothyroxine   75 mcg Oral QAC breakfast   pantoprazole   40 mg Oral Daily   Continuous:  sodium chloride  50 mL/hr at 06/15/24 0920   PRN:acetaminophen  **OR** acetaminophen , HYDROmorphone  (DILAUDID ) injection, ondansetron  **OR** ondansetron  (ZOFRAN ) IV, oxyCODONE     Objective:  Vital Signs  Vitals:   06/15/24 0503 06/15/24 1415 06/15/24 1947 06/16/24 0447  BP: (!) 104/59 115/68 129/70 105/60  Pulse: 90 75 94 69  Resp: 18  16 16   Temp: 98.9 F (37.2 C) 97.8 F (36.6 C) 98 F (36.7 C) 97.8 F (36.6 C)  TempSrc: Oral Oral Oral Oral  SpO2: 95% 94% 100% 98%    Intake/Output Summary (Last 24 hours) at 06/16/2024 9090 Last data filed at 06/15/2024 2024 Gross per  24 hour  Intake 120 ml  Output --  Net 120 ml    General appearance: Awake alert.  In no distress Resp: Clear to auscultation bilaterally.  Normal effort Cardio: S1-S2 is normal regular.  No S3-S4.  No rubs murmurs or bruit GI: Abdomen is soft.  Nontender nondistended.  Bowel sounds are present normal.  No masses  organomegaly  Lab Results:  Data Reviewed: I have personally reviewed following labs and reports of the imaging studies  CBC: Recent Labs  Lab 06/13/24 1537 06/14/24 0528 06/14/24 1149 06/14/24 2146 06/15/24 0507 06/15/24 1152 06/15/24 1743 06/16/24 0457  WBC 15.2* 8.6  --   --  7.6  --   --  6.7  HGB 11.9* 9.1*   < > 8.2* 8.2* 8.4* 9.2* 8.2*  HCT 34.9* 26.7*   < > 25.0* 25.9* 25.9* 27.1* 25.0*  MCV 90.4 91.4  --   --  95.6  --   --  94.7  PLT 299 204  --   --  187  --   --  200   < > = values in this interval not displayed.    Basic Metabolic Panel: Recent Labs  Lab 06/13/24 1537 06/14/24 0528 06/15/24 0507 06/16/24 0457  NA 138 141 140 141  K 3.7 3.9 3.7 3.9  CL 103 107 109 109  CO2 20* 24 21* 21*  GLUCOSE 109* 117* 105* 102*  BUN 22 20 17 11   CREATININE 0.77 0.61 0.56 0.60  CALCIUM  9.8 8.8* 8.3* 8.8*  MG  --   --  2.3  --     GFR: CrCl cannot be calculated (Unknown ideal weight.).  Liver Function Tests: Recent Labs  Lab 06/13/24 1537  AST 33  ALT 33  ALKPHOS 106  BILITOT 0.3  PROT 6.6  ALBUMIN 4.0    Anemia Panel: Recent Labs    06/14/24 0528  VITAMINB12 432  FERRITIN 236  TIBC 241*  IRON  125   Radiology Studies: CT Angio Abd/Pel w/ and/or w/o Result Date: 06/14/2024 CLINICAL DATA:  Lower gastrointestinal bleeding EXAM: CTA ABDOMEN AND PELVIS WITHOUT AND WITH CONTRAST TECHNIQUE: Multidetector CT imaging of the abdomen and pelvis was performed using the standard protocol during bolus administration of intravenous contrast. Multiplanar reconstructed images and MIPs were obtained and reviewed to evaluate the vascular anatomy. RADIATION DOSE REDUCTION: This exam was performed according to the departmental dose-optimization program which includes automated exposure control, adjustment of the mA and/or kV according to patient size and/or use of iterative reconstruction technique. CONTRAST:  OMNIPAQUE  IOHEXOL  350 MG/ML SOLN COMPARISON:  10/13/2023  FINDINGS: VASCULAR Aorta: Atherosclerosis is present, including aortoiliac atherosclerotic disease. No aneurysm or abdominal aortic dissection identified. Celiac: Mild proximal narrowing of the celiac trunk likely attributable to median arcuate ligament. Minimal dorsal atheromatous plaque at the origin of the celiac trunk. SMA: Mild dorsal atheromatous plaque proximally in the superior mesenteric artery and scattered mild atheromatous plaque distally in the SMA. Renals: Scattered atheromatous plaque in the renal arteries, most notable along the proximal anterior margin of the right renal artery, and likely causing mild to moderate right renal artery stenosis proximally. IMA: Patent Inflow: Atheromatous vascular calcifications. Proximal Outflow: Atherosclerosis. Veins: Unremarkable Review of the MIP images confirms the above findings. NON-VASCULAR Lower chest: Hiatal hernia likely including a fundoplication wrap. Coronary and descending thoracic aortic atherosclerotic vascular calcifications. Hepatobiliary: Cholecystectomy. Common bile duct 0.9 cm in diameter, formerly 0.7 cm. No intrahepatic biliary dilatation. Stable benign appearing 5 mm hypodense lesion inferiorly  in the right hepatic lobe on image 29 series 7. No further imaging workup of this lesion is indicated. Pancreas: Unremarkable Spleen: Unremarkable Adrenals/Urinary Tract: 1.0 cm left mid upper kidney cyst posteriorly, image 57 series 5. This has fluid density characteristics. No further imaging workup of this lesion is indicated. Adrenal glands unremarkable. Stomach/Bowel: Hiatal hernia containing the fundoplication. Sigmoid colon diverticulosis. Scattered diverticula elsewhere throughout the colon. Upper normal caliber of the appendix at 0.7 cm. I do not perceive pooling of contrast medium in the bowel lumen on arterial or delayed phase images to indicate a source of active bleeding. Lymphatic: No pathologic adenopathy. Reproductive: Uterus absent.  Other: No supplemental non-categorized findings. Musculoskeletal: Lateral to the right gluteus maximus muscle, a 5.5 by 2.8 by 11.2 cm subcutaneous fluid collection is observed. The patient has a right hip prosthesis and this could reflect a chronic postoperative seroma. IMPRESSION: 1. No active bleeding source is identified. 2. Sigmoid colon diverticulosis and scattered diverticula elsewhere in the colon. 3.  Aortic Atherosclerosis (ICD10-I70.0).  Systemic atherosclerosis. 4. Mild to moderate stenosis of the proximal right renal artery. 5. Hiatal hernia containing the fundoplication wrap. 6. 5.5 by 2.8 by 11.2 cm subcutaneous fluid collection lateral to the right gluteus maximus muscle, probably a chronic postoperative seroma given the nearby right hip implant. 7. Upper normal caliber of the appendix at 0.7 cm. No periappendiceal inflammatory findings. 8.  Aortic Atherosclerosis (ICD10-I70.0). Electronically Signed   By: Ryan Salvage M.D.   On: 06/14/2024 11:47       LOS: 3 days   Daija Routson Verdene  [facility] Pager on [email or web address]  06/16/2024, 9:09 AM

## 2024-06-16 NOTE — Progress Notes (Signed)
 Subjective: Patient was walking around the hallways when I came to examine her.  She states she has had 3 bowel movements today, mostly dark, very small in amount and abdominal pain is resolved.  She has been on regular diet since yesterday evening and had dinner, breakfast and lunch without any issues.  Objective: Vital signs in last 24 hours: Temp:  [97.8 F (36.6 C)-98 F (36.7 C)] 97.8 F (36.6 C) (09/12 0447) Pulse Rate:  [69-94] 69 (09/12 0447) Resp:  [16] 16 (09/12 0447) BP: (105-129)/(60-70) 105/60 (09/12 0447) SpO2:  [94 %-100 %] 98 % (09/12 0447) Weight change:  Last BM Date : 06/14/24  PE: Not in distress, ambulating around the hallways with a walker GENERAL: Mild pallor  ABDOMEN: Nondistended EXTREMITIES: No deformity  Lab Results: Results for orders placed or performed during the hospital encounter of 06/13/24 (from the past 48 hours)  Hemoglobin and hematocrit, blood     Status: Abnormal   Collection Time: 06/14/24  3:49 PM  Result Value Ref Range   Hemoglobin 9.7 (L) 12.0 - 15.0 g/dL   HCT 71.2 (L) 63.9 - 53.9 %    Comment: Performed at [facility], [address] [place], [place] [address]  Hemoglobin and hematocrit, blood     Status: Abnormal   Collection Time: 06/14/24  9:46 PM  Result Value Ref Range   Hemoglobin 8.2 (L) 12.0 - 15.0 g/dL   HCT 74.9 (L) 63.9 - 53.9 %    Comment: Performed at [facility], [address] [place], [place] [address]  CBC     Status: Abnormal   Collection Time: 06/15/24  5:07 AM  Result Value Ref Range   WBC 7.6 4.0 - 10.5 K/uL   RBC 2.71 (L) 3.87 - 5.11 MIL/uL   Hemoglobin 8.2 (L) 12.0 - 15.0 g/dL   HCT 74.0 (L) 63.9 - 53.9 %   MCV 95.6 80.0 - 100.0 fL   MCH 30.3 26.0 - 34.0 pg   MCHC 31.7 30.0 - 36.0 g/dL   RDW 86.7 88.4 - 84.4 %   Platelets 187 150 - 400 K/uL   nRBC 0.0 0.0 - 0.2 %    Comment: Performed at [facility], [address], [place], [place]  [address]  Basic metabolic panel with GFR     Status: Abnormal   Collection Time: 06/15/24  5:07 AM  Result Value Ref Range   Sodium 140 135 - 145 mmol/L   Potassium 3.7 3.5 - 5.1 mmol/L   Chloride 109 98 - 111 mmol/L   CO2 21 (L) 22 - 32 mmol/L   Glucose, Bld 105 (H) 70 - 99 mg/dL    Comment: Glucose reference range applies only to samples taken after fasting for at least 8 hours.   BUN 17 8 - 23 mg/dL   Creatinine, Ser 9.43 0.44 - 1.00 mg/dL   Calcium  8.3 (L) 8.9 - 10.3 mg/dL   GFR, Estimated >39 >39 mL/min    Comment: (NOTE) Calculated using the CKD-EPI Creatinine Equation (2021)    Anion gap 10 5 - 15    Comment: Performed at [facility], [address] [place], [place] [address]  Magnesium     Status: None   Collection Time: 06/15/24  5:07 AM  Result Value Ref Range   Magnesium 2.3 1.7 - 2.4 mg/dL    Comment: Performed at [facility], [address], [place], [place] [address]  Hemoglobin and hematocrit,  blood     Status: Abnormal   Collection Time: 06/15/24 11:52 AM  Result Value Ref Range   Hemoglobin 8.4 (L) 12.0 - 15.0 g/dL   HCT 74.0 (L) 63.9 - 53.9 %    Comment: Performed at [facility], [address], [place], [place] [address]  Hemoglobin and hematocrit, blood     Status: Abnormal   Collection Time: 06/15/24  5:43 PM  Result Value Ref Range   Hemoglobin 9.2 (L) 12.0 - 15.0 g/dL   HCT 72.8 (L) 63.9 - 53.9 %    Comment: Performed at [facility], [address], [place], [place] [address]  CBC     Status: Abnormal   Collection Time: 06/16/24  4:57 AM  Result Value Ref Range   WBC 6.7 4.0 - 10.5 K/uL   RBC 2.64 (L) 3.87 - 5.11 MIL/uL   Hemoglobin 8.2 (L) 12.0 - 15.0 g/dL   HCT 74.9 (L) 63.9 - 53.9 %   MCV 94.7 80.0 - 100.0 fL   MCH 31.1 26.0 - 34.0 pg   MCHC 32.8 30.0 - 36.0 g/dL   RDW 86.8 88.4 - 84.4 %   Platelets 200 150 - 400 K/uL   nRBC 0.0 0.0 - 0.2 %    Comment: Performed at  [facility], [address], [place], [place] [address]  Basic metabolic panel with GFR     Status: Abnormal   Collection Time: 06/16/24  4:57 AM  Result Value Ref Range   Sodium 141 135 - 145 mmol/L   Potassium 3.9 3.5 - 5.1 mmol/L   Chloride 109 98 - 111 mmol/L   CO2 21 (L) 22 - 32 mmol/L   Glucose, Bld 102 (H) 70 - 99 mg/dL    Comment: Glucose reference range applies only to samples taken after fasting for at least 8 hours.   BUN 11 8 - 23 mg/dL   Creatinine, Ser 9.39 0.44 - 1.00 mg/dL   Calcium  8.8 (L) 8.9 - 10.3 mg/dL   GFR, Estimated >39 >39 mL/min    Comment: (NOTE) Calculated using the CKD-EPI Creatinine Equation (2021)    Anion gap 11 5 - 15    Comment: Performed at [facility], [address] [place], [place] [address]    Studies/Results: No results found.  Medications: I have reviewed the patient's current medications.  Assessment: Painless hematochezia, likely diverticular in origin, hemoglobin stable Bleeding seems to have resolved Hemodynamically stable Tolerating regular diet  Plan: Okay to DC home from GI standpoint. I will start her on Metamucil 1 packet once a day and advised patient to use fiber supplement such as Benefiber/Metamucil/psyllium husk 1-2 times a day on a daily basis.  Estelita Manas, MD 06/16/2024, 1:38 PM

## 2024-06-16 NOTE — Plan of Care (Signed)

## 2024-07-04 DIAGNOSIS — Z6826 Body mass index (BMI) 26.0-26.9, adult: Secondary | ICD-10-CM | POA: Diagnosis not present

## 2024-07-04 DIAGNOSIS — K922 Gastrointestinal hemorrhage, unspecified: Secondary | ICD-10-CM | POA: Diagnosis not present

## 2024-07-04 DIAGNOSIS — R1310 Dysphagia, unspecified: Secondary | ICD-10-CM | POA: Diagnosis not present

## 2024-07-12 ENCOUNTER — Telehealth: Payer: Self-pay | Admitting: Gastroenterology

## 2024-07-12 NOTE — Telephone Encounter (Signed)
 Patient would like second opinion for hemorrhage. States she has been in the hospital twice in the last year and states her vitals have gotten worse each time. States her PCP also recommended a second opinion.

## 2024-07-17 ENCOUNTER — Encounter: Payer: Self-pay | Admitting: Cardiology

## 2024-07-17 ENCOUNTER — Ambulatory Visit: Attending: Cardiology | Admitting: Cardiology

## 2024-07-17 VITALS — BP 132/78 | HR 86 | Resp 99 | Ht 62.0 in | Wt 146.0 lb

## 2024-07-17 DIAGNOSIS — R079 Chest pain, unspecified: Secondary | ICD-10-CM | POA: Diagnosis not present

## 2024-07-17 DIAGNOSIS — I6529 Occlusion and stenosis of unspecified carotid artery: Secondary | ICD-10-CM | POA: Diagnosis not present

## 2024-07-17 NOTE — Patient Instructions (Signed)
 Medication Instructions:  Your physician recommends that you continue on your current medications as directed. Please refer to the Current Medication list given to you today.  *If you need a refill on your cardiac medications before your next appointment, please call your pharmacy*  Lab Work: NONE If you have labs (blood work) drawn today and your tests are completely normal, you will receive your results only by: MyChart Message (if you have MyChart) OR A paper copy in the mail If you have any lab test that is abnormal or we need to change your treatment, we will call you to review the results.  Testing/Procedures: Carotid Doppers  Follow-Up: At [facility], you and your health needs are our priority.  As part of our continuing mission to provide you with exceptional heart care, our providers are all part of one team.  This team includes your primary Cardiologist (physician) and Advanced Practice Providers or APPs (Physician Assistants and Nurse Practitioners) who all work together to provide you with the care you need, when you need it.  Your next appointment:   As needed  Provider:   [place], MD  We recommend signing up for the patient portal called MyChart.  Sign up information is provided on this After Visit Summary.  MyChart is used to connect with patients for Virtual Visits (Telemedicine).  Patients are able to view lab/test results, encounter notes, upcoming appointments, etc.  Non-urgent messages can be sent to your provider as well.   To learn more about what you can do with MyChart, go to [email or web address].

## 2024-07-17 NOTE — Progress Notes (Signed)
 Cardiology Office Note:   Date:  07/17/2024  ID:  Sarah [name], DOB 11-06-1950, MRN [id number] PCP: Marvine Rush, MD  [facility] Providers Cardiologist:  Lynwood Schilling, MD {  History of Present Illness:   Sarah [name] is a 73 y.o. female with history of iron  deficiency anemia history of Nissen fundoplication, dyslipidemia, hypertension, hypothyroidism, rectal bleeding.  We saw her in 10/12/2023 with continued complaints of dyspnea, rectal bleeding, and fatigue.  CBC was ordered which revealed a hemoglobin of 7.7 and hematocrit of 23.3.  The patient was subsequently admitted for ongoing GI bleed.  She received blood transfusion.    We have seen her for shortness of breath.  She had an echocardiogram with a well-preserved ejection fraction and mild left ventricular dysfunction.  There were no significant valvular abnormalities.  There is very minimal aortic valve sclerosis.  She was most recently in the hospital with rectal bleeding in September.  She had lower GI bleeding.  This was managed conservatively.  I looked through these records and it did not appear to be any acute cardiac issues.  She returns for follow-up.  She was referred back because of some right lower extremity swelling.  She denies any new shortness of breath, PND or orthopnea.  Her biggest issue continues to be some fatigue but she still does her household chores.  She reports to me that she had an episode of syncope when she was in the emergency room with her latest GI bleed but I reviewed the records and could not find evidence of this.  She has some mild orthostatic symptoms but has not had any repeat syncope since she stopped bleeding.  She is not describing any new palpitations.  She is not having any PND or orthopnea.  She denies any chest pressure, neck or arm discomfort.  None of her symptoms are different than when she had her echocardiogram and POET (Plain Old Exercise Treadmill) in December of last  year.  ROS: As stated in the HPI and negative for all other systems.  Studies Reviewed:    EKG:    NA  Risk Assessment/Calculations:              Physical Exam:   VS:  BP 132/78   Pulse 86   Resp (!) 99   Ht 5' 2 (1.575 m)   Wt 146 lb (66.2 kg)   BMI 26.70 kg/m    Wt Readings from Last 3 Encounters:  07/17/24 146 lb (66.2 kg)  05/30/24 142 lb 13.7 oz (64.8 kg)  05/08/24 140 lb (63.5 kg)     GEN: Well nourished, well developed in no acute distress NECK: No JVD; No carotid bruits CARDIAC: RRR, no murmurs, rubs, gallops RESPIRATORY:  Clear to auscultation without rales, wheezing or rhonchi  ABDOMEN: Soft, non-tender, non-distended EXTREMITIES: Trace left leg edema; No deformity   ASSESSMENT AND PLAN:       Edema:   The patient has some very mild left leg edema.  However, she had a normal echo.  She does have some varicosities.  This is a chronic issue and she is not having any symptoms related to any acute issues.  Not suspecting DVT.  She does not have evidence of heart failure.  This is probably some chronic venous stasis changes and I talked about conservative management to include compression stockings.  Carotid stenosis: The patient had previous mild carotid stenosis on distant Doppler and I will repeat this study.   Follow up  with me as needed.  Signed, Lynwood Schilling, MD

## 2024-07-24 ENCOUNTER — Ambulatory Visit: Payer: Self-pay | Admitting: Cardiology

## 2024-07-24 ENCOUNTER — Ambulatory Visit (HOSPITAL_COMMUNITY)
Admission: RE | Admit: 2024-07-24 | Discharge: 2024-07-24 | Disposition: A | Discharge status: Home / Self Care | Source: Ambulatory Visit | Attending: Cardiology | Admitting: Cardiology

## 2024-07-24 DIAGNOSIS — I6523 Occlusion and stenosis of bilateral carotid arteries: Secondary | ICD-10-CM | POA: Diagnosis not present

## 2024-07-24 DIAGNOSIS — I6529 Occlusion and stenosis of unspecified carotid artery: Secondary | ICD-10-CM | POA: Insufficient documentation

## 2024-07-24 NOTE — Telephone Encounter (Signed)
  The patient is calling to request her results. She stated that she has been having ongoing issues with MyChart and has never been able to log in. She is requesting a phone call with her results instead.

## 2024-08-03 ENCOUNTER — Telehealth: Payer: Self-pay | Admitting: Gastroenterology

## 2024-08-03 NOTE — Telephone Encounter (Signed)
 Good morning Dr. Leigh,   I received a call from this patient stating that she has a referral for Gastrointestinal hemorrhage with melena. When I spoke to the patient she stated she currently see's Dr. Dianna, but she would like to have a second opinion. Patient also stated that every time she has a flare up they just advise her that her rectal bleeding would stop on it's own. Would you please advise on how we should schedule this patient.   Thank you.

## 2024-08-03 NOTE — Telephone Encounter (Signed)
 She has had a pretty extensive evaluation for intermittent bleeding in recent years. If she has active bleeding now, she would need to go to the hospital for further evaluation or if any urgent need would need to call her primary GI physician at [facility]. I can see her for second opinion but no openings in my schedule for a new patient visit until at least January for a new patient visit.  If she has acute issues before then that she needs addressed she will need to see Dr. Dianna / [facility]

## 2024-08-09 ENCOUNTER — Encounter: Payer: Self-pay | Admitting: Oncology

## 2024-08-16 ENCOUNTER — Inpatient Hospital Stay: Payer: Self-pay | Attending: Oncology

## 2024-08-16 ENCOUNTER — Encounter: Payer: Self-pay | Admitting: Oncology

## 2024-08-16 DIAGNOSIS — D509 Iron deficiency anemia, unspecified: Secondary | ICD-10-CM | POA: Diagnosis not present

## 2024-08-16 LAB — CBC
HCT: 37.3 % (ref 36.0–46.0)
Hemoglobin: 12.9 g/dL (ref 12.0–15.0)
MCH: 31.3 pg (ref 26.0–34.0)
MCHC: 34.6 g/dL (ref 30.0–36.0)
MCV: 90.5 fL (ref 80.0–100.0)
Platelets: 246 K/uL (ref 150–400)
RBC: 4.12 MIL/uL (ref 3.87–5.11)
RDW: 12.6 % (ref 11.5–15.5)
WBC: 8.6 K/uL (ref 4.0–10.5)
nRBC: 0 % (ref 0.0–0.2)

## 2024-08-16 LAB — IRON AND TIBC
Iron: 53 ug/dL (ref 28–170)
Saturation Ratios: 16 % (ref 10.4–31.8)
TIBC: 332 ug/dL (ref 250–450)
UIBC: 279 ug/dL

## 2024-08-16 LAB — FERRITIN: Ferritin: 100 ng/mL (ref 11–307)

## 2024-08-23 ENCOUNTER — Inpatient Hospital Stay: Payer: Self-pay | Admitting: Oncology

## 2024-08-23 DIAGNOSIS — D509 Iron deficiency anemia, unspecified: Secondary | ICD-10-CM | POA: Diagnosis not present
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
## 2024-08-24 ENCOUNTER — Encounter: Payer: Self-pay | Admitting: Gastroenterology

## 2024-08-25 ENCOUNTER — Inpatient Hospital Stay

## 2024-08-25 VITALS — BP 139/76 | HR 78 | Temp 97.3°F | Resp 19

## 2024-08-25 DIAGNOSIS — D509 Iron deficiency anemia, unspecified: Secondary | ICD-10-CM

## 2024-08-25 MED ORDER — FAMOTIDINE IN NACL 20-0.9 MG/50ML-% IV SOLN
20.0000 mg | Freq: Once | INTRAVENOUS | Status: AC | PRN
  Administered 2024-08-25: 20 mg via INTRAVENOUS

## 2024-08-25 MED ORDER — METHYLPREDNISOLONE SODIUM SUCC 125 MG IJ SOLR
125.0000 mg | Freq: Once | INTRAMUSCULAR | Status: AC | PRN
  Administered 2024-08-25: 125 mg via INTRAVENOUS

## 2024-08-25 MED ORDER — CETIRIZINE HCL 10 MG/ML IV SOLN
10.0000 mg | Freq: Once | INTRAVENOUS | Status: AC
  Administered 2024-08-25: 10 mg via INTRAVENOUS
  Filled 2024-08-25: qty 1

## 2024-08-25 MED ORDER — ACETAMINOPHEN 325 MG PO TABS
650.0000 mg | ORAL_TABLET | Freq: Once | ORAL | Status: AC
  Administered 2024-08-25: 650 mg via ORAL
  Filled 2024-08-25: qty 2

## 2024-08-25 MED ORDER — SODIUM CHLORIDE 0.9 % IV SOLN: INTRAVENOUS | Status: DC

## 2024-08-25 MED ORDER — IRON SUCROSE 500 MG IVPB - SIMPLE MED
500.0000 mg | Freq: Once | INTRAVENOUS | Status: AC
  Administered 2024-08-25: 500 mg via INTRAVENOUS
  Filled 2024-08-25: qty 400

## 2024-08-25 NOTE — Patient Instructions (Signed)

## 2024-08-25 NOTE — Progress Notes (Signed)
 Patient presents today for Venofer  infusion per providers order.  Vital signs WNL.  Patient has no new complaints at this time.  Peripheral IV started and blood return noted pre and post infusion.  Hypersensitivity Reaction note  Date of event: 08/25/24 Time of event: 1305 Generic name of drug involved: iron  sucrose Name of provider notified of the hypersensitivity reaction: Delon Hope NP Was agent that likely caused hypersensitivity reaction added to Allergies List within EMR? yes Chain of events including reaction signs/symptoms, treatment administered, and outcome (e.g., drug resumed; drug discontinued; sent to Emergency Department; etc.) Venofer  was completed at 1233, patient was at the end of her wait time. 1305 Patient complaining of leg and hand swelling, Mild pitting edema to the bilateral lower legs, no noticeable swelling to the hands but the patient states that they feel tight and cannot make a fist.   1306 Vitals 124/72, HR 72, O2 99, RR 18 NP Jennifer Burns notified  1310 125 mg Solumedrol given IV 1318 20 mg IVPB Pepcid  given Vital signs 129/72, HR 74, O2 99, RR 18 1337 vital signs 139/76, HR78,O2 99, RR 19, Still saying that swelling and tightness is unchanged.  NP notified. 1350 NP in patients room giving patient instructions to follow when she gets home  Vital signs stable.  No complaints at this time.  Discharge from clinic via wheelchair in stable condition.  Alert and oriented X 3.  Follow up with [facility] as scheduled.    Celena Creola Darner, RN 08/25/2024 1:22 PM

## 2024-09-05 ENCOUNTER — Encounter: Payer: Self-pay | Admitting: Adult Health

## 2024-09-05 ENCOUNTER — Ambulatory Visit: Admitting: Adult Health

## 2024-09-05 VITALS — BP 137/83 | HR 76 | Ht 61.0 in | Wt 147.5 lb

## 2024-09-05 DIAGNOSIS — Z90710 Acquired absence of both cervix and uterus: Secondary | ICD-10-CM | POA: Insufficient documentation

## 2024-09-05 DIAGNOSIS — I10 Essential (primary) hypertension: Secondary | ICD-10-CM | POA: Diagnosis not present

## 2024-09-05 DIAGNOSIS — Z90721 Acquired absence of ovaries, unilateral: Secondary | ICD-10-CM

## 2024-09-05 DIAGNOSIS — N939 Abnormal uterine and vaginal bleeding, unspecified: Secondary | ICD-10-CM | POA: Diagnosis not present

## 2024-09-05 DIAGNOSIS — N907 Vulvar cyst: Secondary | ICD-10-CM | POA: Insufficient documentation

## 2024-09-05 NOTE — Progress Notes (Signed)
  Subjective:     Patient ID: Sarah [name], female   DOB: [birth year], 73 y.o.   MRN: [id number]  HPI Sarah [name] is a 73 year old white female, widowed, sp hysterectomy, in complaining of having vaginal bleeding, started Sunday was red and has stopped now, was pink before it stopped. She says felt achy but no pain.  PCP is Dr Marvine  Review of Systems Sp hysterectomy had vaginal bleeding from Sunday til today Felt achy Has felt ?ball in vaginal area Denies any sexual activity Reviewed past medical,surgical, social and family history. Reviewed medications and allergies.     Objective:   Physical Exam BP 137/83 (BP Location: Right Arm, Patient Position: Sitting, Cuff Size: Normal)   Pulse 76   Ht 5' 1 (1.549 m)   Wt 147 lb 8 oz (66.9 kg)   BMI 27.87 kg/m     Skin warm and dry.Pelvic: external genitalia is normal in appearance, has epidermal cyst right vulva, about the size of a pea, vagina: pale and atrophic, no blood or lesions noted,urethra has no lesions or masses noted, cervix abd uterus are absent,adnexa: no masses or tenderness noted. Bladder is non tender and no masses felt  Upstream - 09/05/24 1005       Pregnancy Intention Screening   Does the patient want to become pregnant in the next year? N/A    Does the patient's partner want to become pregnant in the next year? N/A    Would the patient like to discuss contraceptive options today? N/A      Contraception Wrap Up   Current Method Hysterectomy    End Method Hysterectomy    Contraception Counseling Provided No         Examination chaperoned by Clarita Salt LPN  Assessment:     1. Vaginal bleeding (Primary) Had bleeding for 2 days, has stopped No lesions noted  Let me know if returns, probably related to atrophic tissues, pat do not rub and could try estrogen vaginal cream   2. S/P hysterectomy with oophorectomy  3. Essential hypertension Continue lasix  and follow up with PCP  4. Epidermal cyst of  vulva Leave alone    Plan:     Follow up prn

## 2024-10-27 ENCOUNTER — Encounter: Payer: Self-pay | Admitting: Physician Assistant

## 2024-10-31 ENCOUNTER — Ambulatory Visit: Admitting: Gastroenterology

## 2024-11-22 ENCOUNTER — Ambulatory Visit: Admitting: Physician Assistant

## 2024-11-23 ENCOUNTER — Inpatient Hospital Stay: Attending: Oncology

## 2024-11-28 ENCOUNTER — Inpatient Hospital Stay: Admitting: Oncology

## 2024-12-21 ENCOUNTER — Inpatient Hospital Stay

## 2024-12-28 ENCOUNTER — Inpatient Hospital Stay: Admitting: Oncology

## 2025-04-09 ENCOUNTER — Ambulatory Visit: Admitting: Family Medicine

## 2025-04-12 ENCOUNTER — Ambulatory Visit: Admitting: Family Medicine
